# Patient Record
Sex: Female | Born: 1940 | Race: White | Hispanic: No | Smoking: Never smoker
Health system: Southern US, Community
[De-identification: ages and names within clinical notes are randomized; demographics above are authoritative.]

## PROBLEM LIST (undated history)

## (undated) DIAGNOSIS — S82892A Other fracture of left lower leg, initial encounter for closed fracture: Secondary | ICD-10-CM

## (undated) DIAGNOSIS — I82409 Acute embolism and thrombosis of unspecified deep veins of unspecified lower extremity: Secondary | ICD-10-CM

## (undated) DIAGNOSIS — E119 Type 2 diabetes mellitus without complications: Secondary | ICD-10-CM

## (undated) DIAGNOSIS — F32A Depression, unspecified: Secondary | ICD-10-CM

## (undated) DIAGNOSIS — M1990 Unspecified osteoarthritis, unspecified site: Secondary | ICD-10-CM

## (undated) DIAGNOSIS — Y92009 Unspecified place in unspecified non-institutional (private) residence as the place of occurrence of the external cause: Secondary | ICD-10-CM

## (undated) DIAGNOSIS — Z9289 Personal history of other medical treatment: Secondary | ICD-10-CM

## (undated) DIAGNOSIS — I10 Essential (primary) hypertension: Secondary | ICD-10-CM

## (undated) DIAGNOSIS — F329 Major depressive disorder, single episode, unspecified: Secondary | ICD-10-CM

## (undated) DIAGNOSIS — M549 Dorsalgia, unspecified: Secondary | ICD-10-CM

## (undated) DIAGNOSIS — K219 Gastro-esophageal reflux disease without esophagitis: Secondary | ICD-10-CM

## (undated) DIAGNOSIS — E271 Primary adrenocortical insufficiency: Secondary | ICD-10-CM

## (undated) DIAGNOSIS — G8929 Other chronic pain: Secondary | ICD-10-CM

## (undated) DIAGNOSIS — C801 Malignant (primary) neoplasm, unspecified: Secondary | ICD-10-CM

## (undated) DIAGNOSIS — F319 Bipolar disorder, unspecified: Secondary | ICD-10-CM

## (undated) DIAGNOSIS — E7800 Pure hypercholesterolemia, unspecified: Secondary | ICD-10-CM

## (undated) DIAGNOSIS — R55 Syncope and collapse: Secondary | ICD-10-CM

## (undated) DIAGNOSIS — W19XXXA Unspecified fall, initial encounter: Secondary | ICD-10-CM

## (undated) HISTORY — PX: COLOSTOMY: SHX63

## (undated) HISTORY — PX: CATARACT EXTRACTION W/ INTRAOCULAR LENS  IMPLANT, BILATERAL: SHX1307

## (undated) HISTORY — PX: VAGINAL HYSTERECTOMY: SUR661

## (undated) HISTORY — PX: NASAL HEMORRHAGE CONTROL: SHX287

## (undated) HISTORY — PX: DILATION AND CURETTAGE OF UTERUS: SHX78

## (undated) HISTORY — PX: JOINT REPLACEMENT: SHX530

## (undated) HISTORY — PX: COLOSTOMY REVERSAL: SHX5782

## (undated) HISTORY — PX: APPENDECTOMY: SHX54

## (undated) HISTORY — PX: TONSILLECTOMY: SUR1361

---

## 1968-01-09 HISTORY — PX: AUGMENTATION MAMMAPLASTY: SUR837

## 2003-12-06 ENCOUNTER — Ambulatory Visit: Payer: Self-pay

## 2003-12-22 ENCOUNTER — Ambulatory Visit: Payer: Self-pay

## 2004-05-08 ENCOUNTER — Inpatient Hospital Stay: Payer: Self-pay

## 2004-09-29 ENCOUNTER — Inpatient Hospital Stay: Payer: Self-pay

## 2004-09-29 ENCOUNTER — Other Ambulatory Visit: Payer: Self-pay

## 2004-10-19 ENCOUNTER — Ambulatory Visit: Payer: Self-pay | Admitting: Pain Medicine

## 2004-10-23 ENCOUNTER — Ambulatory Visit: Payer: Self-pay | Admitting: Pain Medicine

## 2004-11-22 ENCOUNTER — Ambulatory Visit: Payer: Self-pay | Admitting: Ophthalmology

## 2004-11-28 ENCOUNTER — Ambulatory Visit: Payer: Self-pay | Admitting: Ophthalmology

## 2004-12-05 ENCOUNTER — Ambulatory Visit: Payer: Self-pay | Admitting: Pain Medicine

## 2004-12-11 ENCOUNTER — Ambulatory Visit: Payer: Self-pay | Admitting: Pain Medicine

## 2004-12-12 ENCOUNTER — Ambulatory Visit: Payer: Self-pay | Admitting: Ophthalmology

## 2005-01-11 ENCOUNTER — Ambulatory Visit: Payer: Self-pay | Admitting: Pain Medicine

## 2005-05-01 ENCOUNTER — Ambulatory Visit: Payer: Self-pay | Admitting: Family Medicine

## 2005-05-08 ENCOUNTER — Ambulatory Visit: Payer: Self-pay | Admitting: Pain Medicine

## 2005-05-14 ENCOUNTER — Ambulatory Visit: Payer: Self-pay | Admitting: Pain Medicine

## 2005-06-05 ENCOUNTER — Emergency Department: Payer: Self-pay | Admitting: Emergency Medicine

## 2005-06-12 ENCOUNTER — Ambulatory Visit: Payer: Self-pay | Admitting: Pain Medicine

## 2005-06-20 ENCOUNTER — Ambulatory Visit: Payer: Self-pay | Admitting: Pain Medicine

## 2005-11-12 ENCOUNTER — Ambulatory Visit: Payer: Self-pay

## 2005-12-12 ENCOUNTER — Inpatient Hospital Stay: Payer: Self-pay | Admitting: Internal Medicine

## 2005-12-12 ENCOUNTER — Other Ambulatory Visit: Payer: Self-pay

## 2006-02-13 ENCOUNTER — Other Ambulatory Visit: Payer: Self-pay

## 2006-02-14 ENCOUNTER — Inpatient Hospital Stay: Payer: Self-pay | Admitting: Internal Medicine

## 2006-02-20 ENCOUNTER — Other Ambulatory Visit: Payer: Self-pay

## 2006-08-02 ENCOUNTER — Ambulatory Visit: Payer: Self-pay | Admitting: General Surgery

## 2006-08-07 ENCOUNTER — Ambulatory Visit: Payer: Self-pay | Admitting: General Surgery

## 2006-08-14 ENCOUNTER — Inpatient Hospital Stay: Payer: Self-pay | Admitting: General Surgery

## 2006-09-02 ENCOUNTER — Ambulatory Visit: Payer: Self-pay | Admitting: Family Medicine

## 2006-09-25 ENCOUNTER — Other Ambulatory Visit: Payer: Self-pay

## 2006-09-25 ENCOUNTER — Inpatient Hospital Stay: Payer: Self-pay | Admitting: *Deleted

## 2007-10-08 ENCOUNTER — Ambulatory Visit: Payer: Self-pay | Admitting: Specialist

## 2007-10-13 ENCOUNTER — Ambulatory Visit: Payer: Self-pay | Admitting: Specialist

## 2008-04-04 ENCOUNTER — Emergency Department: Payer: Self-pay | Admitting: Unknown Physician Specialty

## 2008-04-06 ENCOUNTER — Emergency Department: Payer: Self-pay | Admitting: Emergency Medicine

## 2008-05-14 ENCOUNTER — Inpatient Hospital Stay: Payer: Self-pay | Admitting: Internal Medicine

## 2009-01-11 ENCOUNTER — Ambulatory Visit: Payer: Self-pay | Admitting: Family Medicine

## 2009-07-01 ENCOUNTER — Emergency Department: Payer: Self-pay | Admitting: Emergency Medicine

## 2009-08-08 ENCOUNTER — Ambulatory Visit: Payer: Self-pay | Admitting: Family Medicine

## 2009-10-24 ENCOUNTER — Ambulatory Visit: Payer: Self-pay | Admitting: Family Medicine

## 2009-10-31 ENCOUNTER — Ambulatory Visit: Payer: Self-pay | Admitting: Orthopedic Surgery

## 2009-11-09 ENCOUNTER — Ambulatory Visit: Payer: Self-pay | Admitting: Pain Medicine

## 2009-11-14 ENCOUNTER — Other Ambulatory Visit: Payer: Self-pay | Admitting: Pain Medicine

## 2009-11-14 ENCOUNTER — Ambulatory Visit: Payer: Self-pay | Admitting: Pain Medicine

## 2009-12-07 ENCOUNTER — Ambulatory Visit: Payer: Self-pay | Admitting: Pain Medicine

## 2010-01-10 ENCOUNTER — Ambulatory Visit: Payer: Self-pay | Admitting: Surgery

## 2010-01-26 ENCOUNTER — Ambulatory Visit: Payer: Self-pay | Admitting: Internal Medicine

## 2010-02-20 ENCOUNTER — Ambulatory Visit: Payer: Self-pay | Admitting: Surgery

## 2010-03-16 ENCOUNTER — Ambulatory Visit: Payer: Self-pay | Admitting: Pain Medicine

## 2010-03-20 ENCOUNTER — Ambulatory Visit: Payer: Self-pay | Admitting: Pain Medicine

## 2010-04-18 ENCOUNTER — Ambulatory Visit: Payer: Self-pay | Admitting: Pain Medicine

## 2010-04-26 ENCOUNTER — Ambulatory Visit: Payer: Self-pay | Admitting: Pain Medicine

## 2010-05-10 ENCOUNTER — Ambulatory Visit: Payer: Self-pay | Admitting: Pain Medicine

## 2010-05-31 ENCOUNTER — Ambulatory Visit: Payer: Self-pay | Admitting: Pain Medicine

## 2010-07-14 ENCOUNTER — Ambulatory Visit: Payer: Self-pay | Admitting: Unknown Physician Specialty

## 2010-07-20 ENCOUNTER — Inpatient Hospital Stay: Payer: Self-pay | Admitting: Unknown Physician Specialty

## 2010-07-25 ENCOUNTER — Encounter: Payer: Self-pay | Admitting: Internal Medicine

## 2010-07-27 ENCOUNTER — Inpatient Hospital Stay: Payer: Self-pay | Admitting: Internal Medicine

## 2010-08-09 ENCOUNTER — Encounter: Payer: Self-pay | Admitting: Internal Medicine

## 2010-09-09 DIAGNOSIS — I82409 Acute embolism and thrombosis of unspecified deep veins of unspecified lower extremity: Secondary | ICD-10-CM

## 2010-09-09 HISTORY — PX: TOTAL HIP ARTHROPLASTY: SHX124

## 2010-09-09 HISTORY — DX: Acute embolism and thrombosis of unspecified deep veins of unspecified lower extremity: I82.409

## 2010-09-11 ENCOUNTER — Observation Stay: Payer: Self-pay | Admitting: Internal Medicine

## 2011-02-17 ENCOUNTER — Inpatient Hospital Stay: Payer: Self-pay | Admitting: Internal Medicine

## 2011-02-17 LAB — URINALYSIS, COMPLETE
Bilirubin,UR: NEGATIVE
Glucose,UR: >=500 mg/dL (ref 0–75)
Ph: 5 (ref 4.5–8.0)
RBC,UR: 8 /HPF (ref 0–5)
Squamous Epithelial: 2

## 2011-02-17 LAB — COMPREHENSIVE METABOLIC PANEL
Albumin: 3.1 g/dL — ABNORMAL LOW (ref 3.4–5.0)
Alkaline Phosphatase: 58 U/L (ref 50–136)
Bilirubin,Total: 0.5 mg/dL (ref 0.2–1.0)
Chloride: 97 mmol/L — ABNORMAL LOW (ref 98–107)
Creatinine: 1.88 mg/dL — ABNORMAL HIGH (ref 0.60–1.30)
Osmolality: 284 (ref 275–301)
Potassium: 3.9 mmol/L (ref 3.5–5.1)
SGPT (ALT): 14 U/L
Sodium: 136 mmol/L (ref 136–145)
Total Protein: 8.8 g/dL — ABNORMAL HIGH (ref 6.4–8.2)

## 2011-02-17 LAB — BASIC METABOLIC PANEL
Calcium, Total: 8.2 mg/dL — ABNORMAL LOW (ref 8.5–10.1)
Chloride: 107 mmol/L (ref 98–107)
Co2: 21 mmol/L (ref 21–32)
EGFR (Non-African Amer.): 25 — ABNORMAL LOW
Glucose: 108 mg/dL — ABNORMAL HIGH (ref 65–99)
Potassium: 3.6 mmol/L (ref 3.5–5.1)
Sodium: 143 mmol/L (ref 136–145)

## 2011-02-17 LAB — CBC
HCT: 47.3 % — ABNORMAL HIGH (ref 35.0–47.0)
HGB: 15.4 g/dL (ref 12.0–16.0)
MCH: 29.5 pg (ref 26.0–34.0)
MCHC: 32.7 g/dL (ref 32.0–36.0)
RBC: 5.24 10*6/uL — ABNORMAL HIGH (ref 3.80–5.20)

## 2011-02-17 LAB — PROTIME-INR: INR: 1.1

## 2011-02-17 LAB — APTT: Activated PTT: 42.8 secs — ABNORMAL HIGH (ref 23.6–35.9)

## 2011-02-17 LAB — LIPASE, BLOOD: Lipase: 101 U/L (ref 73–393)

## 2011-02-18 LAB — CBC WITH DIFFERENTIAL/PLATELET
Basophil #: 0 10*3/uL (ref 0.0–0.1)
Eosinophil #: 0 10*3/uL (ref 0.0–0.7)
Lymphocyte #: 0.4 10*3/uL — ABNORMAL LOW (ref 1.0–3.6)
Lymphocyte %: 5.6 %
MCHC: 32.6 g/dL (ref 32.0–36.0)
MCV: 91 fL (ref 80–100)
Neutrophil #: 5.7 10*3/uL (ref 1.4–6.5)
Platelet: 245 10*3/uL (ref 150–440)
RBC: 3.82 10*6/uL (ref 3.80–5.20)
RDW: 15.8 % — ABNORMAL HIGH (ref 11.5–14.5)

## 2011-02-18 LAB — BASIC METABOLIC PANEL
Anion Gap: 13 (ref 7–16)
BUN: 44 mg/dL — ABNORMAL HIGH (ref 7–18)
Creatinine: 2.12 mg/dL — ABNORMAL HIGH (ref 0.60–1.30)
EGFR (African American): 30 — ABNORMAL LOW
EGFR (Non-African Amer.): 25 — ABNORMAL LOW

## 2011-02-19 LAB — BASIC METABOLIC PANEL
BUN: 30 mg/dL — ABNORMAL HIGH (ref 7–18)
Calcium, Total: 7.8 mg/dL — ABNORMAL LOW (ref 8.5–10.1)
EGFR (African American): 47 — ABNORMAL LOW
EGFR (Non-African Amer.): 39 — ABNORMAL LOW
Glucose: 163 mg/dL — ABNORMAL HIGH (ref 65–99)
Potassium: 3.8 mmol/L (ref 3.5–5.1)
Sodium: 148 mmol/L — ABNORMAL HIGH (ref 136–145)

## 2011-02-19 LAB — PROTEIN / CREATININE RATIO, URINE: Protein, Random Urine: 12 mg/dL (ref 0–12)

## 2011-02-19 LAB — CBC WITH DIFFERENTIAL/PLATELET
Basophil %: 0 %
Eosinophil #: 0 10*3/uL (ref 0.0–0.7)
Lymphocyte %: 9.5 %
MCHC: 33 g/dL (ref 32.0–36.0)
Monocyte #: 0.2 10*3/uL (ref 0.0–0.7)
Neutrophil %: 86.6 %
RDW: 15.5 % — ABNORMAL HIGH (ref 11.5–14.5)

## 2011-02-20 LAB — HEMOGLOBIN: HGB: 9.3 g/dL — ABNORMAL LOW (ref 12.0–16.0)

## 2011-02-20 LAB — BASIC METABOLIC PANEL
Anion Gap: 10 (ref 7–16)
BUN: 19 mg/dL — ABNORMAL HIGH (ref 7–18)
Chloride: 114 mmol/L — ABNORMAL HIGH (ref 98–107)
Co2: 24 mmol/L (ref 21–32)
Co2: 21 mmol/L (ref 21–32)
Creatinine: 0.88 mg/dL (ref 0.60–1.30)
EGFR (African American): >60
EGFR (Non-African Amer.): >60
Osmolality: 297 (ref 275–301)
Sodium: 142 mmol/L (ref 136–145)

## 2011-02-20 LAB — IRON AND TIBC
Iron Saturation: 24 %
Iron: 73 ug/dL (ref 50–170)
Unbound Iron-Bind.Cap.: 233 ug/dL

## 2011-02-20 LAB — KAPPA/LAMBDA FREE LIGHT CHAINS (ARMC)

## 2011-02-20 LAB — UR PROT ELECTROPHORESIS, URINE RANDOM

## 2011-02-22 LAB — CULTURE, BLOOD (SINGLE)

## 2011-03-26 ENCOUNTER — Ambulatory Visit: Payer: Self-pay | Admitting: Gastroenterology

## 2011-03-27 ENCOUNTER — Ambulatory Visit: Payer: Self-pay | Admitting: Gastroenterology

## 2011-10-30 ENCOUNTER — Ambulatory Visit: Payer: Self-pay | Admitting: Internal Medicine

## 2011-11-09 ENCOUNTER — Ambulatory Visit: Payer: Self-pay | Admitting: Internal Medicine

## 2011-12-31 ENCOUNTER — Ambulatory Visit: Payer: Self-pay | Admitting: Internal Medicine

## 2012-02-09 DIAGNOSIS — S82892A Other fracture of left lower leg, initial encounter for closed fracture: Secondary | ICD-10-CM

## 2012-02-09 DIAGNOSIS — W19XXXA Unspecified fall, initial encounter: Secondary | ICD-10-CM

## 2012-02-09 HISTORY — DX: Other fracture of left lower leg, initial encounter for closed fracture: S82.892A

## 2012-02-09 HISTORY — DX: Unspecified place in unspecified non-institutional (private) residence as the place of occurrence of the external cause: W19.XXXA

## 2012-02-27 ENCOUNTER — Emergency Department: Payer: Self-pay | Admitting: Emergency Medicine

## 2012-04-10 ENCOUNTER — Inpatient Hospital Stay (HOSPITAL_COMMUNITY)
Admission: EM | Admit: 2012-04-10 | Discharge: 2012-04-11 | DRG: 637 | Disposition: A | Discharge status: Home / Self Care | Payer: Medicare Other | Source: Other Acute Inpatient Hospital | Attending: Internal Medicine | Admitting: Internal Medicine

## 2012-04-10 ENCOUNTER — Other Ambulatory Visit: Payer: Self-pay

## 2012-04-10 ENCOUNTER — Emergency Department: Payer: Self-pay | Admitting: Internal Medicine

## 2012-04-10 ENCOUNTER — Encounter (HOSPITAL_COMMUNITY): Payer: Self-pay | Admitting: Internal Medicine

## 2012-04-10 DIAGNOSIS — F411 Generalized anxiety disorder: Secondary | ICD-10-CM | POA: Diagnosis present

## 2012-04-10 DIAGNOSIS — E2749 Other adrenocortical insufficiency: Secondary | ICD-10-CM | POA: Diagnosis present

## 2012-04-10 DIAGNOSIS — Z9660 Presence of unspecified orthopedic joint implant: Secondary | ICD-10-CM

## 2012-04-10 DIAGNOSIS — E1169 Type 2 diabetes mellitus with other specified complication: Principal | ICD-10-CM | POA: Diagnosis present

## 2012-04-10 DIAGNOSIS — I609 Nontraumatic subarachnoid hemorrhage, unspecified: Secondary | ICD-10-CM | POA: Diagnosis present

## 2012-04-10 DIAGNOSIS — E119 Type 2 diabetes mellitus without complications: Secondary | ICD-10-CM

## 2012-04-10 DIAGNOSIS — Z79899 Other long term (current) drug therapy: Secondary | ICD-10-CM

## 2012-04-10 DIAGNOSIS — W1809XA Striking against other object with subsequent fall, initial encounter: Secondary | ICD-10-CM | POA: Diagnosis present

## 2012-04-10 DIAGNOSIS — N189 Chronic kidney disease, unspecified: Secondary | ICD-10-CM

## 2012-04-10 DIAGNOSIS — E162 Hypoglycemia, unspecified: Secondary | ICD-10-CM

## 2012-04-10 DIAGNOSIS — F319 Bipolar disorder, unspecified: Secondary | ICD-10-CM

## 2012-04-10 DIAGNOSIS — N179 Acute kidney failure, unspecified: Secondary | ICD-10-CM

## 2012-04-10 DIAGNOSIS — I10 Essential (primary) hypertension: Secondary | ICD-10-CM

## 2012-04-10 DIAGNOSIS — M25559 Pain in unspecified hip: Secondary | ICD-10-CM | POA: Diagnosis present

## 2012-04-10 DIAGNOSIS — E271 Primary adrenocortical insufficiency: Secondary | ICD-10-CM

## 2012-04-10 DIAGNOSIS — S066X9A Traumatic subarachnoid hemorrhage with loss of consciousness of unspecified duration, initial encounter: Secondary | ICD-10-CM | POA: Diagnosis present

## 2012-04-10 DIAGNOSIS — R55 Syncope and collapse: Secondary | ICD-10-CM

## 2012-04-10 DIAGNOSIS — Z794 Long term (current) use of insulin: Secondary | ICD-10-CM

## 2012-04-10 DIAGNOSIS — I129 Hypertensive chronic kidney disease with stage 1 through stage 4 chronic kidney disease, or unspecified chronic kidney disease: Secondary | ICD-10-CM | POA: Diagnosis present

## 2012-04-10 HISTORY — DX: Unspecified place in unspecified non-institutional (private) residence as the place of occurrence of the external cause: Y92.009

## 2012-04-10 HISTORY — DX: Gastro-esophageal reflux disease without esophagitis: K21.9

## 2012-04-10 HISTORY — DX: Type 2 diabetes mellitus without complications: E11.9

## 2012-04-10 HISTORY — DX: Personal history of other medical treatment: Z92.89

## 2012-04-10 HISTORY — DX: Depression, unspecified: F32.A

## 2012-04-10 HISTORY — DX: Major depressive disorder, single episode, unspecified: F32.9

## 2012-04-10 HISTORY — DX: Syncope and collapse: R55

## 2012-04-10 HISTORY — DX: Other fracture of left lower leg, initial encounter for closed fracture: S82.892A

## 2012-04-10 HISTORY — DX: Acute embolism and thrombosis of unspecified deep veins of unspecified lower extremity: I82.409

## 2012-04-10 HISTORY — DX: Pure hypercholesterolemia, unspecified: E78.00

## 2012-04-10 HISTORY — DX: Primary adrenocortical insufficiency: E27.1

## 2012-04-10 HISTORY — DX: Bipolar disorder, unspecified: F31.9

## 2012-04-10 HISTORY — DX: Dorsalgia, unspecified: M54.9

## 2012-04-10 HISTORY — DX: Unspecified osteoarthritis, unspecified site: M19.90

## 2012-04-10 HISTORY — DX: Essential (primary) hypertension: I10

## 2012-04-10 HISTORY — DX: Other chronic pain: G89.29

## 2012-04-10 HISTORY — DX: Unspecified fall, initial encounter: W19.XXXA

## 2012-04-10 LAB — CBC WITH DIFFERENTIAL/PLATELET
Eosinophils Relative: 1 % (ref 0–5)
HCT: 36.7 % (ref 36.0–46.0)
Hemoglobin: 11.7 g/dL — ABNORMAL LOW (ref 12.0–15.0)
Lymphocytes Relative: 25 % (ref 12–46)
Lymphs Abs: 3 10*3/uL (ref 0.7–4.0)
MCH: 27.5 pg (ref 26.0–34.0)
MCV: 86.2 fL (ref 78.0–100.0)
Monocytes Absolute: 0.7 10*3/uL (ref 0.1–1.0)
Monocytes Relative: 6 % (ref 3–12)
RBC: 4.26 MIL/uL (ref 3.87–5.11)
WBC: 11.9 10*3/uL — ABNORMAL HIGH (ref 4.0–10.5)

## 2012-04-10 LAB — COMPREHENSIVE METABOLIC PANEL
AST: 15 U/L (ref 0–37)
Albumin: 3.3 g/dL — ABNORMAL LOW (ref 3.5–5.2)
Alkaline Phosphatase: 54 U/L (ref 50–136)
BUN: 15 mg/dL (ref 6–23)
Chloride: 112 mmol/L — ABNORMAL HIGH (ref 98–107)
Co2: 24 mmol/L (ref 21–32)
Creatinine, Ser: 1.35 mg/dL — ABNORMAL HIGH (ref 0.50–1.10)
SGOT(AST): 26 U/L (ref 15–37)
SGPT (ALT): 19 U/L (ref 12–78)
Total Protein: 7.4 g/dL (ref 6.4–8.2)
Total Protein: 5.9 g/dL — ABNORMAL LOW (ref 6.0–8.3)

## 2012-04-10 LAB — CBC
MCH: 28.4 pg (ref 26.0–34.0)
MCHC: 32.3 g/dL (ref 32.0–36.0)
Platelet: 363 10*3/uL (ref 150–440)
RDW: 17.6 % — ABNORMAL HIGH (ref 11.5–14.5)
WBC: 10.2 10*3/uL (ref 3.6–11.0)

## 2012-04-10 LAB — CK TOTAL AND CKMB (NOT AT ARMC): CK-MB: 0.8 ng/mL (ref 0.5–3.6)

## 2012-04-10 LAB — GLUCOSE, CAPILLARY

## 2012-04-10 MED ORDER — FLUDROCORTISONE ACETATE 0.1 MG PO TABS
0.1000 mg | ORAL_TABLET | Freq: Every day | ORAL | Status: DC
  Administered 2012-04-10 – 2012-04-11 (×2): 0.1 mg via ORAL
  Filled 2012-04-10 (×2): qty 1

## 2012-04-10 MED ORDER — DEXTROSE 5 % IV SOLN
INTRAVENOUS | Status: AC
  Administered 2012-04-10: 50 mL via INTRAVENOUS

## 2012-04-10 MED ORDER — ONDANSETRON HCL 4 MG/2ML IJ SOLN: 4.0000 mg | Freq: Four times a day (QID) | INTRAMUSCULAR | Status: DC | PRN

## 2012-04-10 MED ORDER — HYDROCORTISONE 10 MG PO TABS
10.0000 mg | ORAL_TABLET | Freq: Every day | ORAL | Status: DC
  Administered 2012-04-10 – 2012-04-11 (×2): 10 mg via ORAL
  Filled 2012-04-10 (×2): qty 1

## 2012-04-10 MED ORDER — SODIUM CHLORIDE 0.9 % IV SOLN: 250.0000 mL | INTRAVENOUS | Status: DC | PRN

## 2012-04-10 MED ORDER — HYDROCODONE-ACETAMINOPHEN 5-325 MG PO TABS
1.0000 | ORAL_TABLET | ORAL | Status: DC | PRN
  Administered 2012-04-10: 2 via ORAL
  Administered 2012-04-10: 1 via ORAL
  Administered 2012-04-11 (×3): 2 via ORAL
  Administered 2012-04-11: 1 via ORAL
  Filled 2012-04-10: qty 2
  Filled 2012-04-10: qty 1
  Filled 2012-04-10 (×4): qty 2

## 2012-04-10 MED ORDER — ACETAMINOPHEN 650 MG RE SUPP: 650.0000 mg | Freq: Four times a day (QID) | RECTAL | Status: DC | PRN

## 2012-04-10 MED ORDER — POLYETHYLENE GLYCOL 3350 17 G PO PACK
17.0000 g | PACK | Freq: Every day | ORAL | Status: DC | PRN
  Filled 2012-04-10: qty 1

## 2012-04-10 MED ORDER — MORPHINE SULFATE 2 MG/ML IJ SOLN
2.0000 mg | INTRAMUSCULAR | Status: DC | PRN
  Administered 2012-04-11: 2 mg via INTRAVENOUS
  Filled 2012-04-10: qty 1

## 2012-04-10 MED ORDER — SODIUM CHLORIDE 0.9 % IJ SOLN
3.0000 mL | Freq: Two times a day (BID) | INTRAMUSCULAR | Status: DC
  Administered 2012-04-11: 3 mL via INTRAVENOUS

## 2012-04-10 MED ORDER — ACETAMINOPHEN 325 MG PO TABS
650.0000 mg | ORAL_TABLET | Freq: Four times a day (QID) | ORAL | Status: DC | PRN
  Filled 2012-04-10: qty 2

## 2012-04-10 MED ORDER — ONDANSETRON HCL 4 MG PO TABS: 4.0000 mg | ORAL_TABLET | Freq: Four times a day (QID) | ORAL | Status: DC | PRN

## 2012-04-10 MED ORDER — SODIUM CHLORIDE 0.9 % IJ SOLN: 3.0000 mL | INTRAMUSCULAR | Status: DC | PRN

## 2012-04-10 MED ORDER — HYDROCORTISONE 5 MG PO TABS
15.0000 mg | ORAL_TABLET | Freq: Every day | ORAL | Status: DC
  Administered 2012-04-11: 15 mg via ORAL
  Filled 2012-04-10 (×2): qty 1

## 2012-04-10 MED ORDER — SODIUM CHLORIDE 0.9 % IJ SOLN
3.0000 mL | Freq: Two times a day (BID) | INTRAMUSCULAR | Status: DC
  Administered 2012-04-10 – 2012-04-11 (×2): 3 mL via INTRAVENOUS

## 2012-04-10 NOTE — Progress Notes (Signed)
Patient received from [facility] via care link into room 3303. Transferred w/ some assistance into our bed. Patient oriented and settled into the room. Instructed not to get OOB and to call for help.
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
## 2012-04-11 ENCOUNTER — Inpatient Hospital Stay (HOSPITAL_COMMUNITY): Payer: Medicare Other

## 2012-04-11 DIAGNOSIS — N189 Chronic kidney disease, unspecified: Secondary | ICD-10-CM

## 2012-04-11 DIAGNOSIS — F319 Bipolar disorder, unspecified: Secondary | ICD-10-CM

## 2012-04-11 LAB — GLUCOSE, CAPILLARY
Glucose-Capillary: 111 mg/dL — ABNORMAL HIGH (ref 70–99)
Glucose-Capillary: 133 mg/dL — ABNORMAL HIGH (ref 70–99)

## 2012-04-11 LAB — CBC
HCT: 38.3 % (ref 36.0–46.0)
MCH: 27.5 pg (ref 26.0–34.0)
MCV: 87 fL (ref 78.0–100.0)
RDW: 17.7 % — ABNORMAL HIGH (ref 11.5–15.5)
WBC: 8.2 10*3/uL (ref 4.0–10.5)

## 2012-04-11 LAB — TSH: TSH: 0.961 u[IU]/mL (ref 0.350–4.500)

## 2012-04-11 LAB — HEMOGLOBIN A1C
Hgb A1c MFr Bld: 7.7 % — ABNORMAL HIGH (ref <5.7)
Mean Plasma Glucose: 174 mg/dL — ABNORMAL HIGH (ref <117)

## 2012-04-11 LAB — BASIC METABOLIC PANEL
BUN: 15 mg/dL (ref 6–23)
Calcium: 8.7 mg/dL (ref 8.4–10.5)
Chloride: 103 mEq/L (ref 96–112)
Creatinine, Ser: 1.36 mg/dL — ABNORMAL HIGH (ref 0.50–1.10)
GFR calc Af Amer: 45 mL/min — ABNORMAL LOW (ref >90)

## 2012-04-11 MED ORDER — MORPHINE SULFATE 4 MG/ML IJ SOLN: 4.0000 mg | INTRAMUSCULAR | Status: DC | PRN

## 2012-04-11 NOTE — Evaluation (Signed)
Physical Therapy Evaluation Patient Details Name: Jordan Brewer MRN: [id number] DOB: 08-01-1940 Today's Date: 04/11/2012 Time: 7425-9563 PT Time Calculation (min): 10 min  PT Assessment / Plan / Recommendation Clinical Impression  Pt adm after syncope/fall at home.  Pt with bifrontal SAH.  Pt also with fall in February that resulted in lt foot/ankle fx.  Pt to return home with son.  Recommend HHPT to look at home safety since pt has had multiple falls at home.  Reiterated to pt to wear lt CAM boot when OOB (pt wanted to walk to BR without boot).    PT Assessment  Patient needs continued PT services    Follow Up Recommendations  Home health PT    Does the patient have the potential to tolerate intense rehabilitation      Barriers to Discharge        Equipment Recommendations  None recommended by PT    Recommendations for Other Services     Frequency Min 3X/week    Precautions / Restrictions Precautions Precautions: Fall Precaution Comments: Pt fell in early Feb and broke her ankle.  In walking boot at this time.  Larey Seat on this admission due to syncopal episode. Required Braces or Orthoses: Other Brace/Splint Other Brace/Splint: Walking boot for L foot due to ankle Fx in early Feb. Restrictions Weight Bearing Restrictions: No   Pertinent Vitals/Pain Headache      Mobility  Bed Mobility Bed Mobility: Sit to Supine Supine to Sit: 7: Independent Sitting - Scoot to Edge of Bed: 7: Independent Sit to Supine: 7: Independent Details for Bed Mobility Assistance: no assist needed. Transfers Transfers: Stand Pivot Transfers Sit to Stand: 5: Supervision Stand to Sit: 5: Supervision Stand Pivot Transfers: 5: Supervision Details for Transfer Assistance: S for safety. Ambulation/Gait Ambulation/Gait Assistance: 5: Supervision;4: Min assist Ambulation Distance (Feet): 150 Feet Assistive device: Small based quad cane Ambulation/Gait Assistance Details: In room pt needed only  supervision for mobility but in hallway needed hand-held assist as well as quad cane.  Has rolling walker and rollator at home. Gait Pattern: Step-to pattern Gait velocity: decr    Exercises     PT Diagnosis: Difficulty walking  PT Problem List: Decreased balance;Decreased mobility;Decreased knowledge of precautions PT Treatment Interventions: DME instruction;Gait training;Patient/family education;Functional mobility training;Therapeutic activities;Therapeutic exercise;Balance training   PT Goals Acute Rehab PT Goals PT Goal Formulation: With patient Time For Goal Achievement: 04/18/12 Potential to Achieve Goals: Good Pt will go Sit to Stand: with modified independence PT Goal: Sit to Stand - Progress: Goal set today Pt will go Stand to Sit: with modified independence PT Goal: Stand to Sit - Progress: Goal set today Pt will Ambulate: 51 - 150 feet;with modified independence;with least restrictive assistive device PT Goal: Ambulate - Progress: Goal set today  Visit Information  Last PT Received On: 04/11/12 Assistance Needed: +1    Subjective Data  Subjective: "I can do it," pt stating about putting on her CAM boot. Patient Stated Goal: Go home   Prior Functioning  Home Living Lives With: Son Available Help at Discharge: Family;Available 24 hours/day Type of Home: Mobile home Home Access: Stairs to enter Entrance Stairs-Number of Steps: 4 Entrance Stairs-Rails: Left;Right;Can reach both Home Layout: One level Bathroom Shower/Tub: Tub/shower unit;Walk-in shower Bathroom Toilet: Standard Home Adaptive Equipment: Bedside commode/3-in-1;Grab bars around toilet;Grab bars in shower;Hand-held shower hose;Shower chair with back;Quad cane Prior Function Level of Independence: Independent Able to Take Stairs?: Yes Driving: Yes Vocation: Retired Musician: No difficulties Dominant Hand: Right  Cognition  Cognition Overall Cognitive Status: Appears within  functional limits for tasks assessed/performed Arousal/Alertness: Awake/alert Orientation Level: Oriented X4 / Intact Behavior During Session: [facility] for tasks performed Cognition - Other Comments: appears grossly intact.  Some mild concern for the fact she has had 2 falls in last two months.      Extremity/Trunk Assessment Right Upper Extremity Assessment RUE ROM/Strength/Tone: Within functional levels RUE Sensation: WFL - Light Touch RUE Coordination: WFL - gross/fine motor Left Upper Extremity Assessment LUE ROM/Strength/Tone: Within functional levels LUE Sensation: WFL - Light Touch LUE Coordination:  (great amount of swelling in back of hand.) Right Lower Extremity Assessment RLE ROM/Strength/Tone: WFL for tasks assessed Left Lower Extremity Assessment LLE ROM/Strength/Tone: Deficits LLE ROM/Strength/Tone Deficits: Limited due to fx Trunk Assessment Trunk Assessment: Normal   Balance Balance Balance Assessed: Yes Dynamic Standing Balance Dynamic Standing - Balance Support: Right upper extremity supported;During functional activity Dynamic Standing - Level of Assistance: 5: Stand by assistance Dynamic Standing - Balance Activities: Reaching for objects;Reaching across midline  End of Session PT - End of Session Equipment Utilized During Treatment: Gait belt Activity Tolerance: Patient tolerated treatment well Patient left: in bed;with call bell/phone within reach;with bed alarm set Nurse Communication: Mobility status  GP     Eagle Pitta 04/11/2012, 12:20 PM  [facility] PT [phone]

## 2012-04-11 NOTE — Progress Notes (Signed)
Utilization review completed. Orma Cheetham, RN, BSN. 

## 2012-04-11 NOTE — Evaluation (Signed)
Occupational Therapy Evaluation and Discharge Summary Patient Details Name: Jordan Brewer MRN: [id number] DOB: [birth year] Today's Date: 04/11/2012 Time: 7829-5621 OT Time Calculation (min): 28 min  OT Assessment / Plan / Recommendation Clinical Impression  Pt is a 72 yo female admitted for possible syncopal episode due to low blood sugar resulting in a fall and a B frontal SDH.  Pt overall is close to baseline with her walking boot from old fall.  Pt will have son at home with her and is S for ADLS.  No OT needs at this tie.    OT Assessment  Patient does not need any further OT services    Follow Up Recommendations  No OT follow up    Barriers to Discharge      Equipment Recommendations  None recommended by OT    Recommendations for Other Services    Frequency       Precautions / Restrictions Precautions Precautions: Fall Precaution Comments: Pt fell in early Feb and broke her ankle.  In walking boot at this time.  Larey Seat on this admission due to syncopal episode. Required Braces or Orthoses: Other Brace/Splint Other Brace/Splint: Walking boot for L foot due to ankle Fx in early Feb. Restrictions Weight Bearing Restrictions: No   Pertinent Vitals/Pain Pt with headache 5/10 pain.    ADL  Eating/Feeding: Performed;Independent Where Assessed - Eating/Feeding: Edge of bed Grooming: Performed;Wash/dry hands;Wash/dry face;Supervision/safety Where Assessed - Grooming: Supported standing Upper Body Bathing: Simulated;Set up Where Assessed - Upper Body Bathing: Unsupported sitting Lower Body Bathing: Simulated;Set up Where Assessed - Lower Body Bathing: Supported sit to stand Upper Body Dressing: Performed;Set up Where Assessed - Upper Body Dressing: Unsupported sitting Lower Body Dressing: Performed;Set up Where Assessed - Lower Body Dressing: Supported sit to stand Toilet Transfer: Research scientist (life sciences) Method: Sit to stand;Stand Financial trader: Comfort height toilet;Grab bars Toileting - Architect and Hygiene: Performed;Supervision/safety Where Assessed - Engineer, mining and Hygiene: Standing Equipment Used: Cane Transfers/Ambulation Related to ADLs: All mobilty done with S in room. ADL Comments: Pt overall S to mod I with use of cane to get around.  Cues to stand and get her bearings before walking.    OT Diagnosis:    OT Problem List:   OT Treatment Interventions:     OT Goals    Visit Information  Last OT Received On: 04/11/12 Assistance Needed: +1    Subjective Data  Subjective: "I will have my son with me when I go home." Patient Stated Goal: to go home   Prior Functioning     Home Living Lives With: Son Available Help at Discharge: Family;Available 24 hours/day Type of Home: Mobile home Home Access: Stairs to enter Entrance Stairs-Number of Steps: 4 Entrance Stairs-Rails: Left;Right;Can reach both Home Layout: One level Bathroom Shower/Tub: Tub/shower unit;Walk-in shower Bathroom Toilet: Standard Home Adaptive Equipment: Bedside commode/3-in-1;Grab bars around toilet;Grab bars in shower;Hand-held shower hose;Shower chair with back;Quad cane Prior Function Level of Independence: Independent Able to Take Stairs?: Yes Driving: Yes Vocation: Retired Musician: No difficulties Dominant Hand: Right         Vision/Perception Vision - History Baseline Vision: No visual deficits Patient Visual Report: No change from baseline Vision - Assessment Vision Assessment: Vision not tested   [employer] Overall Cognitive Status: Appears within functional limits for tasks assessed/performed Arousal/Alertness: Awake/alert Orientation Level: Oriented X4 / Intact Behavior During Session: [facility] for tasks performed Cognition - Other Comments: appears grossly intact.  Some mild  concern for the fact she has had 2 falls in last two months.       Extremity/Trunk Assessment Right Upper Extremity Assessment RUE ROM/Strength/Tone: Within functional levels RUE Sensation: WFL - Light Touch RUE Coordination: WFL - gross/fine motor Left Upper Extremity Assessment LUE ROM/Strength/Tone: Within functional levels LUE Sensation: WFL - Light Touch LUE Coordination:  (great amount of swelling in back of hand.) Trunk Assessment Trunk Assessment: Normal     Mobility Bed Mobility Bed Mobility: Supine to Sit;Sitting - Scoot to Edge of Bed Supine to Sit: 7: Independent Sitting - Scoot to Edge of Bed: 7: Independent Details for Bed Mobility Assistance: no assist needed. Transfers Transfers: Sit to Stand;Stand to Sit Sit to Stand: 5: Supervision Stand to Sit: 5: Supervision Details for Transfer Assistance: S for safety.     Exercise     Balance Balance Balance Assessed: Yes Dynamic Standing Balance Dynamic Standing - Balance Support: Right upper extremity supported;During functional activity Dynamic Standing - Level of Assistance: 5: Stand by assistance Dynamic Standing - Balance Activities: Reaching for objects;Reaching across midline   End of Session OT - End of Session Activity Tolerance: Patient tolerated treatment well Patient left: in bed;with call bell/phone within reach Nurse Communication: Mobility status  GO     Hope Budds 04/11/2012, 9:46 AM [phone]

## 2012-04-11 NOTE — Care Management Note (Unsigned)
    Page 1 of 1   04/11/2012     11:22:19 AM   CARE MANAGEMENT NOTE 04/11/2012  Patient:  DEONI, COSEY   Account Number:  [id number]  Date Initiated:  04/11/2012  Documentation initiated by:  Letha Cape  Subjective/Objective Assessment:   dx syncope , hypoglcemia,sah  admit- lives with son.     Action/Plan:   pt/ot eval- per ot pt has no ot needs, waiting for pt eval.   Anticipated DC Date:  04/11/2012   Anticipated DC Plan:  HOME/SELF CARE      DC Planning Services  CM consult      Choice offered to / List presented to:             Status of service:  Completed, signed off Medicare Important Message given?   (If response is "NO", the following Medicare IM given date fields will be blank) Date Medicare IM given:   Date Additional Medicare IM given:    Discharge Disposition:  HOME/SELF CARE  Per UR Regulation:  Reviewed for med. necessity/level of care/duration of stay  If discussed at Long Length of Stay Meetings, dates discussed:    Comments:  04/11/12 11:02 Letha Cape RN, BSN [phone] patient lives with son.  PTA indep.  Await pt /ot evals. Pt has not ot needs, await pt eval.

## 2012-04-11 NOTE — Progress Notes (Signed)
Subjective: Patient reports HA , but otherwise doing well - was up ambulating  Objective: Vital signs in last 24 hours: Temp:  [98.2 F (36.8 C)-99.3 F (37.4 C)] 98.3 F (36.8 C) (04/04 0531) Pulse Rate:  [90-116] 90 (04/04 0531) Resp:  [20-28] 20 (04/04 0531) BP: (108-163)/(70-83) 128/83 mmHg (04/04 0531) SpO2:  [91 %-99 %] 97 % (04/04 0531) Weight:  [72.122 kg (159 lb)-74.4 kg (164 lb 0.4 oz)] 74.4 kg (164 lb 0.4 oz) (04/04 0500)  Intake/Output from previous day: 04/03 0701 - 04/04 0700 In: 271.7 [P.O.:110; I.V.:161.7] Out: 800 [Urine:800] Intake/Output this shift: Total I/O In: 240 [P.O.:240] Out: -   AAOx3 - moves all 4 well  Lab Results:  Recent Labs  04/10/12 2018 04/11/12 0547  WBC 11.9* 8.2  HGB 11.7* 12.1  HCT 36.7 38.3  PLT 264 302   BMET  Recent Labs  04/10/12 2018 04/11/12 0547  NA 139 138  K 4.0 3.8  CL 105 103  CO2 23 27  GLUCOSE 185* 143*  BUN 15 15  CREATININE 1.35* 1.36*  CALCIUM 8.5 8.7    Studies/Results: Ct Head Wo Contrast  04/11/2012  *RADIOLOGY REPORT*  Clinical Data: Follow-up subarachnoid hemorrhage.  Swelling posterior head.  CT HEAD WITHOUT CONTRAST  Technique:  Contiguous axial images were obtained from the base of the skull through the vertex without contrast.  Comparison: None.  Findings: The brain shows a background pattern of atrophy with low density throughout the hemispheric white matter likely to represent chronic small vessel disease.  There is some subarachnoid blood within the sulci at the vertex on the left, with a very little bit on the right as well.  No subarachnoid blood seen around the base of the brain.  No intraventricular blood.  No intraparenchymal hemorrhage.  No skull fracture.  There is posterior scalp swelling at the vertex.  No fluid in the visualized sinuses, middle ears or mastoids.  IMPRESSION: Background pattern of chronic appearing small vessel disease throughout the cerebral hemispheres.  Small amount of  subarachnoid blood in the sulci at the vertex on the left and to a minimal extent on the right.  Posterior scalp swelling at the vertex.  This consolation of findings suggests post- traumatic subarachnoid hemorrhage rather than aneurysmal hemorrhage.   Original Report Authenticated By: Paulina Fusi, M.D.     Assessment/Plan: CT improved  - no further neurosurgical intervention needed -    LOS: 1 day     Malory Spurr R, MD 04/11/2012, 9:42 AM
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
## 2012-04-11 NOTE — Progress Notes (Signed)
Nsg Discharge Note  Admit Date:  04/10/2012 Discharge date: 04/11/2012   Jordan Brewer to be D/C'd Home per MD order.  AVS completed.  Copy for chart, and copy for patient signed, and dated. Patient/caregiver able to verbalize understanding.  Discharge Medication:   Medication List    TAKE these medications       ALPRAZolam 0.5 MG tablet  Commonly known as:  XANAX  Take 0.5 mg by mouth 2 (two) times daily as needed for sleep. For anxiety     DULoxetine 60 MG capsule  Commonly known as:  CYMBALTA  Take 60 mg by mouth daily.     fenofibrate 160 MG tablet  Take 160 mg by mouth daily.     fludrocortisone 0.1 MG tablet  Commonly known as:  FLORINEF  Take 0.1 mg by mouth daily.     HYDROcodone-acetaminophen 7.5-325 MG per tablet  Commonly known as:  NORCO  Take 1 tablet by mouth 3 (three) times daily as needed for pain. For hip pain     hydrocortisone 10 MG tablet  Commonly known as:  CORTEF  Take 10-20 mg by mouth daily.     lisinopril 20 MG tablet  Commonly known as:  PRINIVIL,ZESTRIL  Take 20 mg by mouth 2 (two) times daily.     omeprazole 40 MG capsule  Commonly known as:  PRILOSEC  Take 40 mg by mouth 2 (two) times daily.     SYSTANE OP  Apply 1 drop to eye daily as needed. For burning/irritated eyes        Discharge Assessment: Filed Vitals:   04/11/12 1336  BP: 124/71  Pulse: 95  Temp: 98 F (36.7 C)  Resp: 20   Skin clean, dry and intact without evidence of skin break down, no evidence of skin tears noted. IV catheter discontinued intact. Site without signs and symptoms of complications - no redness or edema noted at insertion site, patient denies c/o pain - only slight tenderness at site.  Dressing with slight pressure applied.  D/c Instructions-Education: Discharge instructions given to patient/family with verbalized understanding. D/c education completed with patient/family including follow up instructions, medication list, d/c activities limitations  if indicated, with other d/c instructions as indicated by MD - patient able to verbalize understanding, all questions fully answered. Patient instructed to return to ED, call 911, or call MD for any changes in condition.  Patient escorted via WC, and D/C home via private auto.  Jordan Brewer Consuella Lose, RN 04/11/2012 7:13 PM

## 2012-04-16 MED FILL — Ondansetron HCl Inj 4 MG/2ML (2 MG/ML): INTRAMUSCULAR | Qty: 2 | Status: AC

## 2012-05-19 ENCOUNTER — Ambulatory Visit: Payer: Self-pay | Admitting: Internal Medicine

## 2013-07-13 ENCOUNTER — Inpatient Hospital Stay: Payer: Self-pay | Admitting: Internal Medicine

## 2013-07-13 LAB — URINALYSIS, COMPLETE
Bilirubin,UR: NEGATIVE
Glucose,UR: >=500 mg/dL (ref 0–75)
Nitrite: NEGATIVE
Ph: 5 (ref 4.5–8.0)
SPECIFIC GRAVITY: 1.015 (ref 1.003–1.030)
WBC UR: 100 /HPF (ref 0–5)

## 2013-07-13 LAB — PHOSPHORUS: PHOSPHORUS: 2.5 mg/dL (ref 2.5–4.9)

## 2013-07-13 LAB — COMPREHENSIVE METABOLIC PANEL
ALBUMIN: 2.8 g/dL — AB (ref 3.4–5.0)
AST: 21 U/L (ref 15–37)
Alkaline Phosphatase: 90 U/L
Anion Gap: 12 (ref 7–16)
BUN: 26 mg/dL — AB (ref 7–18)
Bilirubin,Total: 0.6 mg/dL (ref 0.2–1.0)
CO2: 22 mmol/L (ref 21–32)
Calcium, Total: 9.7 mg/dL (ref 8.5–10.1)
Chloride: 97 mmol/L — ABNORMAL LOW (ref 98–107)
Creatinine: 1.62 mg/dL — ABNORMAL HIGH (ref 0.60–1.30)
EGFR (African American): 37 — ABNORMAL LOW
EGFR (Non-African Amer.): 32 — ABNORMAL LOW
GLUCOSE: 208 mg/dL — AB (ref 65–99)
OSMOLALITY: 274 (ref 275–301)
Potassium: 4.2 mmol/L (ref 3.5–5.1)
SGPT (ALT): 19 U/L (ref 12–78)
Sodium: 131 mmol/L — ABNORMAL LOW (ref 136–145)
TOTAL PROTEIN: 8.2 g/dL (ref 6.4–8.2)

## 2013-07-13 LAB — CBC WITH DIFFERENTIAL/PLATELET
BASOS ABS: 0.1 10*3/uL (ref 0.0–0.1)
Basophil %: 0.5 %
Eosinophil #: 0.1 10*3/uL (ref 0.0–0.7)
Eosinophil %: 0.5 %
HCT: 45.7 % (ref 35.0–47.0)
HGB: 14.7 g/dL (ref 12.0–16.0)
LYMPHS PCT: 8.4 %
Lymphocyte #: 0.9 10*3/uL — ABNORMAL LOW (ref 1.0–3.6)
MCH: 26.5 pg (ref 26.0–34.0)
MCHC: 32.1 g/dL (ref 32.0–36.0)
MCV: 83 fL (ref 80–100)
MONOS PCT: 7.4 %
Monocyte #: 0.8 x10 3/mm (ref 0.2–0.9)
NEUTROS PCT: 83.2 %
Neutrophil #: 9.1 10*3/uL — ABNORMAL HIGH (ref 1.4–6.5)
Platelet: 275 10*3/uL (ref 150–440)
RBC: 5.54 10*6/uL — ABNORMAL HIGH (ref 3.80–5.20)
RDW: 18.9 % — AB (ref 11.5–14.5)
WBC: 10.9 10*3/uL (ref 3.6–11.0)

## 2013-07-13 LAB — MAGNESIUM: MAGNESIUM: 1.5 mg/dL — AB

## 2013-07-13 LAB — PROTIME-INR
INR: 1.1
Prothrombin Time: 13.8 secs (ref 11.5–14.7)

## 2013-07-13 LAB — TROPONIN I: Troponin-I: <0.02 ng/mL

## 2013-07-14 LAB — CBC WITH DIFFERENTIAL/PLATELET
BASOS PCT: 0.5 %
Basophil #: 0.1 10*3/uL (ref 0.0–0.1)
EOS PCT: 1.2 %
Eosinophil #: 0.1 10*3/uL (ref 0.0–0.7)
HCT: 36 % (ref 35.0–47.0)
HGB: 11.4 g/dL — AB (ref 12.0–16.0)
Lymphocyte #: 0.7 10*3/uL — ABNORMAL LOW (ref 1.0–3.6)
Lymphocyte %: 7.4 %
MCH: 26.6 pg (ref 26.0–34.0)
MCHC: 31.8 g/dL — AB (ref 32.0–36.0)
MCV: 84 fL (ref 80–100)
MONOS PCT: 8.2 %
Monocyte #: 0.8 x10 3/mm (ref 0.2–0.9)
Neutrophil #: 8.3 10*3/uL — ABNORMAL HIGH (ref 1.4–6.5)
Neutrophil %: 82.7 %
PLATELETS: 219 10*3/uL (ref 150–440)
RBC: 4.3 10*6/uL (ref 3.80–5.20)
RDW: 18.7 % — AB (ref 11.5–14.5)
WBC: 10.1 10*3/uL (ref 3.6–11.0)

## 2013-07-14 LAB — BASIC METABOLIC PANEL
ANION GAP: 9 (ref 7–16)
BUN: 22 mg/dL — ABNORMAL HIGH (ref 7–18)
CALCIUM: 7.9 mg/dL — AB (ref 8.5–10.1)
CREATININE: 1.37 mg/dL — AB (ref 0.60–1.30)
Chloride: 107 mmol/L (ref 98–107)
Co2: 23 mmol/L (ref 21–32)
EGFR (African American): 45 — ABNORMAL LOW
EGFR (Non-African Amer.): 39 — ABNORMAL LOW
Glucose: 153 mg/dL — ABNORMAL HIGH (ref 65–99)
Osmolality: 284 (ref 275–301)
Potassium: 4 mmol/L (ref 3.5–5.1)
SODIUM: 139 mmol/L (ref 136–145)

## 2013-07-15 LAB — BASIC METABOLIC PANEL
ANION GAP: 13 (ref 7–16)
BUN: 20 mg/dL — AB (ref 7–18)
CALCIUM: 8.3 mg/dL — AB (ref 8.5–10.1)
CREATININE: 0.98 mg/dL (ref 0.60–1.30)
Chloride: 109 mmol/L — ABNORMAL HIGH (ref 98–107)
Co2: 19 mmol/L — ABNORMAL LOW (ref 21–32)
GFR CALC NON AF AMER: 58 — AB
Glucose: 195 mg/dL — ABNORMAL HIGH (ref 65–99)
Osmolality: 289 (ref 275–301)
Potassium: 4.8 mmol/L (ref 3.5–5.1)
Sodium: 141 mmol/L (ref 136–145)

## 2013-07-15 LAB — URINE CULTURE

## 2013-07-15 LAB — CBC WITH DIFFERENTIAL/PLATELET
BASOS PCT: 0.3 %
Basophil #: 0 10*3/uL (ref 0.0–0.1)
Eosinophil #: 0 10*3/uL (ref 0.0–0.7)
Eosinophil %: 0 %
HCT: 35.4 % (ref 35.0–47.0)
HGB: 11 g/dL — ABNORMAL LOW (ref 12.0–16.0)
LYMPHS ABS: 0.3 10*3/uL — AB (ref 1.0–3.6)
Lymphocyte %: 3 %
MCH: 26 pg (ref 26.0–34.0)
MCHC: 31 g/dL — AB (ref 32.0–36.0)
MCV: 84 fL (ref 80–100)
Monocyte #: 0.2 x10 3/mm (ref 0.2–0.9)
Monocyte %: 2.3 %
Neutrophil #: 9.7 10*3/uL — ABNORMAL HIGH (ref 1.4–6.5)
Neutrophil %: 94.4 %
PLATELETS: 242 10*3/uL (ref 150–440)
RBC: 4.23 10*6/uL (ref 3.80–5.20)
RDW: 19.3 % — ABNORMAL HIGH (ref 11.5–14.5)
WBC: 10.3 10*3/uL (ref 3.6–11.0)

## 2013-07-16 LAB — BASIC METABOLIC PANEL
Anion Gap: 9 (ref 7–16)
BUN: 22 mg/dL — ABNORMAL HIGH (ref 7–18)
CALCIUM: 8.7 mg/dL (ref 8.5–10.1)
CO2: 27 mmol/L (ref 21–32)
Chloride: 106 mmol/L (ref 98–107)
Creatinine: 1.05 mg/dL (ref 0.60–1.30)
EGFR (African American): >60
GFR CALC NON AF AMER: 54 — AB
GLUCOSE: 256 mg/dL — AB (ref 65–99)
OSMOLALITY: 295 (ref 275–301)
Potassium: 4.5 mmol/L (ref 3.5–5.1)
SODIUM: 142 mmol/L (ref 136–145)

## 2013-07-16 LAB — CBC WITH DIFFERENTIAL/PLATELET
BASOS PCT: 0.4 %
Basophil #: 0.1 10*3/uL (ref 0.0–0.1)
EOS ABS: 0 10*3/uL (ref 0.0–0.7)
Eosinophil %: 0 %
HCT: 34.4 % — ABNORMAL LOW (ref 35.0–47.0)
HGB: 10.8 g/dL — ABNORMAL LOW (ref 12.0–16.0)
LYMPHS ABS: 0.5 10*3/uL — AB (ref 1.0–3.6)
LYMPHS PCT: 4.1 %
MCH: 26.5 pg (ref 26.0–34.0)
MCHC: 31.3 g/dL — AB (ref 32.0–36.0)
MCV: 85 fL (ref 80–100)
MONO ABS: 0.4 x10 3/mm (ref 0.2–0.9)
Monocyte %: 3.3 %
NEUTROS PCT: 92.2 %
Neutrophil #: 10.9 10*3/uL — ABNORMAL HIGH (ref 1.4–6.5)
Platelet: 320 10*3/uL (ref 150–440)
RBC: 4.06 10*6/uL (ref 3.80–5.20)
RDW: 19.3 % — ABNORMAL HIGH (ref 11.5–14.5)
WBC: 11.9 10*3/uL — ABNORMAL HIGH (ref 3.6–11.0)

## 2013-07-18 LAB — CULTURE, BLOOD (SINGLE)

## 2014-01-14 DIAGNOSIS — H029 Unspecified disorder of eyelid: Secondary | ICD-10-CM | POA: Diagnosis not present

## 2014-01-14 DIAGNOSIS — H02824 Cysts of left upper eyelid: Secondary | ICD-10-CM | POA: Diagnosis not present

## 2014-01-25 DIAGNOSIS — M25861 Other specified joint disorders, right knee: Secondary | ICD-10-CM | POA: Diagnosis not present

## 2014-01-25 DIAGNOSIS — M25561 Pain in right knee: Secondary | ICD-10-CM | POA: Diagnosis not present

## 2014-01-25 DIAGNOSIS — M25571 Pain in right ankle and joints of right foot: Secondary | ICD-10-CM | POA: Diagnosis not present

## 2014-01-25 DIAGNOSIS — M1711 Unilateral primary osteoarthritis, right knee: Secondary | ICD-10-CM | POA: Diagnosis not present

## 2014-04-07 DIAGNOSIS — F419 Anxiety disorder, unspecified: Secondary | ICD-10-CM | POA: Diagnosis not present

## 2014-04-07 DIAGNOSIS — I10 Essential (primary) hypertension: Secondary | ICD-10-CM | POA: Diagnosis not present

## 2014-04-07 DIAGNOSIS — F5104 Psychophysiologic insomnia: Secondary | ICD-10-CM | POA: Diagnosis not present

## 2014-04-07 DIAGNOSIS — G894 Chronic pain syndrome: Secondary | ICD-10-CM | POA: Diagnosis not present

## 2014-05-01 NOTE — Discharge Summary (Signed)
PATIENT NAME:  Jordan Brewer, Jordan Brewer MR#:  [id number] DATE OF BIRTH:  [birth year]  DATE OF ADMISSION:  07/13/2013 DATE OF DISCHARGE:  07/16/2013  DIAGNOSES AT THE TIME OF DISCHARGE: 1. Escherichia coli urinary tract infection with sepsis.  2. Type 2 diabetes.  3. History of Addison's disease.  4. Anxiety and depression.   CHIEF COMPLAINT: Confusion and fever.   HISTORY OF PRESENT ILLNESS: Jordan Brewer is a 74 year old female who presented to the ED complaining of fever, dysuria associated with vomiting, and weakness. The patient was also complaining of abdominal pain and foul smelling urine associated with increased frequency and pain. The patient had been treated with outpatient antibiotics but continued to be symptomatic. She was noted to have an elevated white cell count also and she was noted to have an elevated lactic acid at 1.4 and was treated with IV fluids and IV cefepime.   PAST MEDICAL HISTORY: Significant for Addison's disease, bipolar disorder, hypertension, hyperlipidemia, anxiety, neuropathy, GERD, and depression. Please see H and P for other details.   LABORATORY DATA: Showed a cloudy-appearing urine with glucose greater than 500 ketones and trace 2+ blood, leukocyte esterase 2+, lactic acid elevated at 1.4, glucose is 208, BUN 26, creatinine 1.62, sodium 131, potassium 4.2. Urine cultures grew Escherichia coli greater than 100,000 CFU per milliliter that was sensitive to Levaquin, ceftriaxone, nitrofurantoin, and Bactrim. Blood cultures were negative.   HOSPITAL COURSE: During her stay in the hospital the patient gradually improved. She received intravenous fluids and her serum creatinine improved from 1.62 to 1.05.   She was also seen by physical therapy and discharged in stable condition on the following medications: Amlodipine 10 mg once a day,  fludrocortisone 0.1 mg once a day, duloxetine 60 mg once a day, omeprazole 40 mg b.i.d., fenofibrate 1 tab once a day, Humalog KwikPen 8  units subcutaneous in morning 10 units at lunch and 12 units in the evening, lovastatin 40 mg once a day, hydrocortisone 20 mg once a day and 10 mg at bedtime, lisinopril 20 mg once a day, Ceftin 250 mg p.o. b.i.d. for 7 days, and Percocet 1 tablet daily, p.r.n.   The patient has been advised to drink plenty of fluids and to follow up in the clinic with me, Dr. Ginette Pitman, in 1 to 2 weeks' time, advised to call back with any questions or concern.   TOTAL TIME SPENT ON DISCHARGING THIS PATIENT: 35 minutes.     ____________________________ Tracie Harrier, MD vh:lt D: 08/03/2013 18:09:02 ET T: 08/04/2013 04:31:05 ET JOB#: [id number]  cc: Tracie Harrier, MD, <Dictator> Tracie Harrier MD ELECTRONICALLY SIGNED 08/06/2013 17:57

## 2014-05-01 NOTE — H&P (Signed)
PATIENT NAME:  Jordan Brewer, Jordan Brewer MR#:  [id number] DATE OF BIRTH:  06-20-1940  DATE OF ADMISSION:  07/14/2013  PRIMARY CARE PHYSICIAN: Dr. Tracie Harrier  REFERRING PHYSICIAN: Dr. Joni Fears   CHIEF COMPLAINT: Intermittent episodes of confusion and fever.   HISTORY OF PRESENT ILLNESS: The patient is a 74 year old Caucasian female who is brought into the ED via EMS, as the patient was complaining of fever, dysuria, vomiting, and generalized weakness. According to the ER records, the patient was having a four-day history of epigastric abdominal pain associated with fever, foul-smelling urine, frequent urination and burning and painful micturition. This is associated with generalized weakness and vomiting. The patient tried outpatient antibiotics given by her primary care physician, with no significant improvement. The patient was treated with p.o. antibiotics multiple times for acute cystitis in the past four weeks, but still has a urinary tract infection that has not completely resolved. In the ED, the patient's laboratories revealed elevated lactic acid at 1.40. White count is not elevated, which could be from the outpatient treatment with antibiotics. Blood cultures and urine cultures were ordered, and the patient was given cefepime. She also has received IV fluids. As the patient's magnesium is low, she received 2 grams of magnesium sulfate in IV piggyback form. During my examination, the patient is very sleepy and tired and falling asleep, complaining that she is feeling weak and having pain in the stomach. No other complaints. The patient is tired, and could not give any history. No family was at bedside.   PAST MEDICAL HISTORY: Diabetes mellitus, Addison disease, bipolar disorder, hypertension, hyperlipidemia, anxiety, neuropathy, GERD and depression.   PAST SURGICAL HISTORY: Ostomy reversal, diverticulitis, Hartmann's procedure, recent history of left hip surgery in July 2012.   ALLERGIES: SHE IS  ALLERGIC TO CODEINE AND PHENERGAN.   PSYCHOSOCIAL HISTORY: Lives alone, lives at home. According to the old records, she does not smoke, drink or use any illicit drugs.   FAMILY HISTORY: Mother passed away at age 39 with CVA complications. Father deceased at age 15 with lung cancer.   REVIEW OF SYSTEMS: Unobtainable, as the patient is very lethargic and sleepy.   HOME MEDICATIONS: Omeprazole 40 mg p.o. b.i.d., lovastatin 40 mg p.o. once a day, lisinopril 20 mg p.o. 2 times a day, hydrocortisone 20 mg 1 tablet p.o. once daily, hydrocortisone 10 mg at bedtime, fenofibrate 160 mg once daily, duloxetine 60 mg capsules 1 capsule p.o. once daily, amlodipine 10 mg p.o. once daily, Tylenol 1 tablet p.o. 3 times a day.   PHYSICAL EXAMINATION: VITAL SIGNS: Temperature 98.8, pulse 106, respirations 24, blood pressure is 121/69, pulse oximetry is 96%. Initial temperature in the ED was at 101 degrees Fahrenheit, with pulse at 136. GENERAL APPEARANCE: Not in any acute distress. Moderately built and nourished.  HEENT: Normocephalic, atraumatic. Pupils are equally reacting to light and accommodation. No scleral icterus. No conjunctival injection. Dry mucous membranes.  NECK: Supple. No JVD. No thyromegaly. Spontaneously moving her neck.  LUNGS: Clear to auscultation bilaterally. No accessory muscle. There is no anterior chest wall tenderness on palpation.  CARDIAC: S1 and S2 normal. Regular rate and rhythm. No murmurs. Tachycardic. No peripheral edema.  GASTROINTESTINAL: Soft. Bowel sounds are positive in all four quadrants. Positive epigastric tenderness. No rebound tenderness. No masses.  NEUROLOGIC: The patient is very sleepy and lethargic, arousable but not answering any questions, falling asleep. Could not elicit motor and sensory. Reflexes are 2+.  EXTREMITIES: No edema. No cyanosis. No clubbing.  SKIN: Warm to  touch. Normal turgor. No rashes. No lesions.  MUSCULOSKELETAL: No joint effusion, tenderness,  erythema.  PSYCHIATRIC: Mood and affect could not be elicited.   LABORATORY AND IMAGING STUDIES: Urinalysis: Cloudy in appearance. Glucose greater than 500, ketones are trace, blood 2+, nitrates negative, leukocyte esterase 2+, protein 100 mg/dL. Lactic acid is elevated at 1.40. PT is 13.8, INR 1.1. CBC: WBC 10.9, hemoglobin 14.7, hematocrit is normal. Platelets are normal. MCV at 83. Troponin less than 0.02. LFTs are normal except albumin of 2.8. Chem-8: Glucose 208, BUN 26, creatinine 1.62, sodium is 131, potassium 4.2, chloride 97, CO2 22. GFR 32. Anion gap, serum osmolality and calcium are normal. Magnesium is at 1.5, phosphorus 2.5. Portable chest x-ray: No active cardiopulmonary disease. A 12-lead EKG: Sinus tachycardia at 134 beats per minute, nonspecific ST-T wave changes, left axis deviation.   ASSESSMENT AND PLAN: A 74 year old Caucasian female, brought into the Emergency Department from home with a chief complaint of fever, nausea, generalized weakness, urinary frequency and foul-smelling urine. Will be admitted with the following assessment and plan.   1.  Sepsis secondary to acute cystitis. Blood cultures and urine cultures were obtained in the Emergency Department. The patient will be given cefepime, as she failed on outpatient antibiotics multiple times. We will provide her IV fluids.  2.  Hypomagnesemia. Magnesium sulfate 2 grams IV was given in the Emergency Department. 3.  Chronic history of Addison disease. The patient is on a hydrocortisone at home. We will provide her stress dose steroids with Solu-Cortef 100 mg IV and continue q.8h.  4.  Chronic history of gastroesophageal reflux disease. We will provide her ranitidine.  5.  Diabetes mellitus. Currently, the patient is lethargic, so will provide her insulin sliding scale. 6.  History of hypertension. Blood pressure is low normal. Will monitor, and, if necessary, we will hold off on her antihypertensive medications.  7.  Acute  kidney injury. We will provide her IV fluids. At this point, we will hold off on lisinopril.   CODE STATUS: The patient being lethargic, I was unable to discuss code status with the patient. Until code status is known, she will be full code. The patient will be transferred to Dr. Marcelino Scot in a.m.   TOTAL TIME SPENT: 50 minutes.    ____________________________ Nicholes Mango, MD ag:cg D: 07/14/2013 00:14:24 ET T: 07/14/2013 02:35:48 ET JOB#: [id number]  cc: Nicholes Mango, MD, <Dictator> Tracie Harrier, MD  Nicholes Mango MD ELECTRONICALLY SIGNED 07/25/2013 2:12

## 2014-05-02 NOTE — Consult Note (Signed)
Chief Complaint:   Subjective/Chief Complaint no n/v or abd pain, no bm. stable   VITAL SIGNS/ANCILLARY NOTES: **Vital Signs.:   11-Feb-13 14:08   Vital Signs Type Routine   Temperature Temperature (F) 98.1   Celsius 36.7   Temperature Source oral   Pulse Pulse 99   Pulse source per Dinamap   Respirations Respirations 20   Systolic BP Systolic BP 270   Diastolic BP (mmHg) Diastolic BP (mmHg) 97   Mean BP 122   BP Source Dinamap   Pulse Ox % Pulse Ox % 95   Pulse Ox Activity Level  At rest   Oxygen Delivery 2L   Brief Assessment:   Cardiac Regular    Respiratory clear BS    Gastrointestinal details normal Soft  Nontender  Nondistended  No masses palpable  Bowel sounds normal   Routine Coag:  09-Feb-13 10:31    INR 1.1  Routine Hem:  11-Feb-13 05:04    WBC (CBC) 4.1   RBC (CBC) 3.45   Hemoglobin (CBC) 10.3   Hematocrit (CBC) 31.2   Platelet Count (CBC) 239   MCV 90   MCH 29.8   MCHC 33.0   RDW 15.5  Routine Chem:  11-Feb-13 05:04    Glucose, Serum 163   BUN 30   Creatinine (comp) 1.43   Sodium, Serum 148   Potassium, Serum 3.8   Chloride, Serum 114   CO2, Serum 21   Calcium (Total), Serum 7.8   Osmolality (calc) 304   eGFR (African American) 47   eGFR (Non-African American) 39   Anion Gap 13  Routine Hem:  11-Feb-13 05:04    Neutrophil % 86.6   Lymphocyte % 9.5   Monocyte % 3.9   Eosinophil % 0.0   Basophil % 0.0   Neutrophil # 3.6   Lymphocyte # 0.4   Monocyte # 0.2   Eosinophil # 0.0   Basophil # 0.0  Blood Glucose:  11-Feb-13 07:36    POCT Blood Glucose 134    11:11    POCT Blood Glucose 118   Radiology Results: [place]:    10-Feb-13 15:34, US Kidney Bilateral   US Kidney Bilateral    REASON FOR EXAM:    renal failure, reports urinary retention in tyhe   apst, r/o obstruction, hydro  COMMENTS:       PROCEDURE: [place]  - US KIDNEY  - Feb 18 2011  3:34PM     RESULT: The kidneys are normal in contour. The right kidney measures 11.8   x 4.7 x  4.9 cm. The left kidney measures 10.8 x 4.4 x 4.5 cm. There is no   evidence of hydronephrosis. There is an 10mm diameter cyst in the mid to   upper pole of the right kidney. The echotexture of the renal cortex on   the right is equal to or slightly lower than that of the adjacent liver   which may reflect medical renal disease. The urinary bladder is   moderately distended. A small amount of debris is noted to be floating in   the urinary bladder lumen.    IMPRESSION:   1. I do not see evidence of urinary tract obstruction.  2. The renal cortical echotexture on the right is slightly increased, but   it remains equal to or below that of the adjacent liver. This could   reflect medical renal disease.  3. There is evidence of mobile echogenic debris within the urinary   bladder.  Verified By: DAVID A. Martinique, M.D., MD   Assessment/Plan:  Assessment/Plan:   Assessment 1) reported black stool and coffeeground emesis. no overt bleeding since admission    Plan 1) egd today.  I have discussed the risks bemndfits and complications of  egd to include not limited to bleeding infection perforation and sedation and she wishes to proceed.  further recs to follow.   Electronic Signatures: Loistine Simas (MD)  (Signed 11-Feb-13 15:05)  Authored: Chief Complaint, VITAL SIGNS/ANCILLARY NOTES, Brief Assessment, Lab Results, Radiology Results, Assessment/Plan   Last Updated: 11-Feb-13 15:05 by Loistine Simas (MD)

## 2014-05-02 NOTE — Consult Note (Signed)
PATIENT NAME:  Jordan Brewer, SPELTZ MR#:  [id number] DATE OF BIRTH:  1940/06/20  DATE OF CONSULTATION:  02/18/2011  REFERRING PHYSICIAN:  Dr. Royden Purl  CONSULTING PHYSICIAN:  Lollie Sails, MD  REASON FOR CONSULTATION: Hematemesis.   HISTORY OF PRESENT ILLNESS: Ms. Jordan Brewer is a 74 year old Caucasian female who was admitted to the hospital yesterday with reported hematemesis. She states that she was feeling well on Wednesday. Then she woke up midway through the night of Thursday/early morning with an episode of emesis. There was a lot of dark material in this but no bright bleeding. She had multiple episodes of emesis from Thursday through Saturday. At that time her family members came to see her, saw some darkness to the emesis, and brought her to the Emergency Room. She has not had any recurrent emesis since she came to the hospital. There is no history of peptic ulcer disease. Currently she denies any nausea, vomiting, or abdominal pain. Prior to these episodes she had no nausea, vomiting, or abdominal pain. She has been having a lot of heartburn and had been placed on some Nexium by her primary physician, this then changed to omeprazole due to cost factors. She states that she has been taking omeprazole since November. However, also she has been taking Motrin at least twice a day since October as well. She states she saw 1 black bowel movement about 1 or 2 weeks ago. She had a hip replacement in July 2012. She has had a lot of problems with heartburn and reflux. There has been no dysphagia. She has had a small drop in her hemoglobin with rehydration in the hospital.   PAST MEDICAL HISTORY:  1. History of Addison's disease.  2. Bipolar disorder.  3. Diabetes.  4. Hypertension.  5. Hyperlipidemia.  6. Anxiety.  7. Neuropathy.  8. Gastroesophageal reflux. 9. Depression.  10. She underwent a sigmoid colectomy with initially Hartman's pouch procedure for acute diverticulitis. Subsequently, her ostomy  was reversed.  11. She also had left hip surgery/hip replacement in July 2012.   ALLERGIES: She is allergic to codeine and Phenergan.   CURRENT MEDICATIONS:  1. Hydrocodone/acetaminophen 7.5/325, one every eight hours p.r.n.  2. Alprazolam 0.25 mg every eight hours p.r.n.  3. Amlodipine 10 mg once a day.  4. Enteric-coated aspirin daily, dose uncertain. 5. Coreg 6.25 mg twice a day. 6. Cymbalta 60 mg daily.  7. Flexeril 10 mg every eight hours p.r.n.  8. Fludrocortisone 0.1 mg every morning.  9. Hydrocortisone 10 mg orally, three in the morning, two in the evening.  10. Lisinopril 20 mg twice a day. 11. Lovastatin 40 mg daily. 12. Meloxicam 7.5 mg, one twice a day.  13. Metformin 1000 mg twice a day.  14. Metoprolol 50 mg q. a.m.  15. MiraLAX for constipation.  16. Neurontin 300 mg, 1 three times a day.  17. Omeprazole 40 mg once a day.  18. TriCor 145 mg once a day. 19. Tylenol Extra Strength p.r.n.  20. Zofran 4 mg every four hours p.r.n.   SOCIAL HISTORY: She lives alone. She does not smoke or drink.   GI FAMILY HISTORY: Negative for colorectal cancer, liver disease, or ulcers.   REVIEW OF SYSTEMS: 10 systems reviewed per admission History and Physical, agree with same.   PHYSICAL EXAMINATION:  VITAL SIGNS: Temperature 98.2, pulse 85, respirations 20, blood pressure 114/68, pulse oximetry 100%.   GENERAL: She is a well-appearing 74 year old Caucasian female in no acute distress.   HEAD: Normocephalic, atraumatic.  EYES: Anicteric.   NOSE: Septum midline. No lesions.   OROPHARYNX: No lesions.   NECK: Supple. No JVD.   HEART: Regular rate and rhythm.   LUNGS: Bilaterally clear.   ABDOMEN: Soft, nontender, and nondistended. Bowel sounds positive, normoactive.   RECTAL: Anorectal exam is deferred.   EXTREMITIES: No clubbing, cyanosis, or edema.   NEUROLOGICAL: Cranial nerves II through XII grossly intact. Muscle strength bilaterally equal and symmetric. Deep  tendon reflexes bilaterally equal and symmetric.   LABORATORY, DIAGNOSTIC, AND RADIOLOGICAL DATA: On admission to the hospital she had a glucose of 166, BUN 37, creatinine 1.88, sodium 136, potassium 3.9, chloride 97, bicarbonate 20, calcium 10.1, amylase 50, lipase 101. Hepatic profile showing a total protein of 8.8, albumin 3.1, total bilirubin 0.5, alkaline phosphatase 58, AST 20, ALT 14. Her hemogram showed a white count of 12.2, hemoglobin/hematocrit 15.4/47.3, platelet count 274, normal indices. She has had two hemoglobins checked since admission, one at 11.8 and the most recent 11.3. She has a proTime of 14.4, INR 1.1, activated PTT of 42.8. She has had blood cultures times two, no growth. She had a urinalysis showing leukocyte esterase 2+, WBCs 162 per high-power field.   ASSESSMENT: Reported hematemesis in the setting of urinary tract infection. The patient has been taking NSAIDs but has been also on a PPI. There is no history of peptic ulcer disease. She has had a black bowel movement as well as the reported coffee-ground type emesis. Currently she is hemodynamically stable and on antibiotics. She is also on medications for her Addison's disease.     RECOMMENDATIONS:  1. We will pursue EGD tomorrow. I have discussed the risks, benefits, and complications of EGD to include but not limited to bleeding, infection, perforation, and the risks of sedation, and she wishes to proceed.  2. Continue b.i.d. IV PPI.  3. Daily hemoglobin.  4. Further recommendations to follow.     ____________________________ Lollie Sails, MD mus:bjt D: 02/18/2011 17:16:57 ET T: 02/19/2011 05:37:33 ET JOB#: [id number]  cc: Lollie Sails, MD, <Dictator> Lollie Sails MD ELECTRONICALLY SIGNED 03/05/2011 9:15

## 2014-05-02 NOTE — Consult Note (Signed)
Brief Consult Note: Diagnosis: hematemesis.   Patient was seen by consultant.   Consult note dictated.   Recommend to proceed with surgery or procedure.   Orders entered.   Comments: Pateint seen and examined, please see full dictated consult [phone].  Pateitn admitted with n/v in the setting of uti.  Reported coffeeground type material in emesis, no brb.  Patient on daily nsids and ppi at home. Continue ppi, will arrange for egd tomorrow.  I have discussed the risks benefits and complications of egd to include not limited to bleeding infection perforation and sedation and she wishes to proceed.  Electronic Signatures: Loistine Simas (MD)  (Signed 10-Feb-13 17:19)  Authored: Brief Consult Note   Last Updated: 10-Feb-13 17:19 by Loistine Simas (MD)

## 2014-05-02 NOTE — Discharge Summary (Signed)
PATIENT NAME:  Jordan Brewer, Jordan Brewer MR#:  [id number] DATE OF BIRTH:  11-16-1940  DATE OF ADMISSION:  02/17/2011 DATE OF DISCHARGE:  02/20/2011  ADMITTING DIAGNOSIS: Nausea, vomiting, hematemesis.  DISCHARGE DIAGNOSES:  1. Hematemesis of unclear etiology at this time status post esophagogastroduodenoscopy on 02/19/2011 which did not show any source of bleeding by Dr. Gustavo Lah. Normal barium swallowing test and small bowel follow-through.  2. Acute posthemorrhagic anemia with hemoglobin 9.3 on 02/20/2011.  3. Acute renal failure, resolved.  4. Urinary tract infection, questionable acute pyelonephritis. Escherichia coli, sensitivities to follow.  5. Premature atrial contractions likely to due to hypomagnesemia, resolved.  6. Metabolic lactic acidosis likely due to acute renal failure versus metformin related.  7. Diabetes mellitus. 8. Addison's disease.  9. Hypotension, likely due to systemic inflammatory response reaction versus Addison's disease, adrenal crisis related.  10. Insomnia.   DISCHARGE CONDITION: Stable.   DISCHARGE MEDICATIONS: The patient is to resume her outpatient medications which are:  1. TriCor 145 mg p.o. daily.  2. Amlodipine 10 mg p.o. daily.  3. Cymbalta 60 mg p.o. daily.  4. Fludrocortisone 0.1 mg p.o. daily.  5. Tylenol 500 to 1000 mg every four hours as needed.  6. Alprazolam 0.25 mg p.o. every eight hours as needed.  7. Neurontin 300 mg 3 times daily.  8. Acetaminophen hydrocodone 325 mg/7.5 mg, 1 tablet every eight hours as needed.  9. Hydrocortisone 30 mg in the morning and 20 mg in the afternoon.  10. Mevacor 40 mg p.o. at bedtime.  11. Sliding scale insulin.  12. Ambien 5 mg p.o. at bedtime as needed.  13. Omeprazole 40 mg p.o. twice daily.  14. Zofran 4 mg every four hours as needed.  15. Keflex 500 mg p.o. 3 times daily for 10 more days.   The patient is not to take aspirin, metformin, meloxicam, metoprolol, lisinopril, or alendronate  until recommended  by primary care physician, Dr. Rutherford Nail.    HOME OXYGEN: None.   DIET:  Soft, liquid, to be advanced to regular 1800 ADA diet slowly over the next few days. Low salt, low fat.   FOLLOWUP:  1. Follow-up appointment with Dr. Rutherford Nail two days after discharge. 2. The patient is also to follow up with Dr. Gustavo Lah one week after discharge.   CONSULTANTS:  1. Dr. Gustavo Lah. 2. Dr. Anthonette Legato. 3. Care management.   LABORATORY, DIAGNOSTIC, AND RADIOLOGICAL DATA:  1. Chest portable single view x-ray 02/17/2011: No evidence of pneumonia or acute cardiopulmonary abnormality.  2. Small bowel follow-through 02/20/2011: Unremarkable small bowel follow-through series. 3. Ultrasound of the kidneys bilateral: No evidence of urinary tract obstruction. Renal cortical echotexture on the right is slightly increased but it remains equal to or below that of adjacent liver. This could reflect medical renal disease.  Evidence of mobile echogenic debris within the urinary bladder.  HISTORY OF PRESENT ILLNESS:  The patient is a 74 year old female with past medical history significant for history of diabetes, Addison's disease, bipolar disorder, hypertension, and hyperlipidemia who presented to the hospital with complaints of nausea, vomiting, abdominal pain, hematemesis, and was diagnosed with acute renal failure. Please refer to Dr. Ashok Croon  admission note on 02/17/2011. On arrival to the hospital the patient's vitals showed a temperature of 99.4 up to one 100.4, heart rate 145, blood pressure 117/56, saturation was 100% on two liters. Respiratory rate was 20.  Physical exam revealed tachycardia. No lower extremity edema was noted. The patient's abdominal exam was unremarkable.   LABORATORY DATA: 02/17/2011:  Glucose 166, BUN and creatinine were 38 and 1.88. The patient's bicarbonate level was low at 20. Estimated GFR for non-African American was 28. Anion gap was high at 19. Lipase 101. Amylase 50. Liver enzymes:  Albumin of 3.1, otherwise unremarkable study. White blood cell count was elevated to 12.2, hemoglobin 15.4, platelet count 278. Coagulation panel showed proTime 14.4, INR 1.1, and activated PTT was 42.8. Blood cultures times two did not show any growth on 02/17/2011.    Urinalysis showed amber cloudy urine, more than 500 glucose, negative for bilirubin, trace ketones. Specific gravity was 1.021, pH 5,  2+ blood, more than 500 protein, negative for nitrites, 2+ leukocyte esterase noted. 8 red blood cells, 162 white blood cells, 3+ bacteria were noted and 2 epithelial cells. Mucous as well as budding yeast was present.   ABGs were done on 28% FiO2 and showed pH of 7.4, pCO2 29, pO2 119, bicarbonate level 18, oxygen saturation was 97.7. Lactic acid level was 0.9.   EKG: Normal sinus rhythm at 118 beats per minute, normal axis. No acute ST-T changes were noted.   HOSPITAL COURSE: The patient was admitted to the hospital because of concerns of urinary tract infection, even pyelonephritis, and acute renal failure. She was started on IV fluids as well as antibiotic therapy. Because of concerns of hematemesis she was also started on PPI twice daily IV. Consultation with Dr. Gustavo Lah was obtained.  Dr. Gustavo Lah saw the patient in consultation the next day on 02/18/2011. He felt that the patient had hematemesis. He recommended proceeding with EGD.  Reported coffee-ground type of emesis.  No bright red blood. The patient was on daily nonsteroidal anti-inflammatory medications at home as well as aspirin. Recommended to continue PPIs as well as consider EGD on 02/19/2011. He explained the risks, benefits, and complications of EGD and the patient's family and  the patient wanted to proceed. The patient proceeded to upper GI endoscopy on 02/19/2011 by Dr. Gustavo Lah. It was actually unremarkable. According to Dr. Marton Redwood report, the patient's examined esophagus was normal. A small hiatal hernia was found. The Z-line was a  variable distance from incisors. Hiatal hernia with sliding. Entire examined stomach was normal. Cardia as well as gastric fundus were normal on retroflexion. Examined duodenum was normal.  Impression: Normal esophagus, hiatal hernia, normal stomach, normal examined duodenum. He recommended getting a small bowel series and checking Hemoccults. The patient did quite well after admission to the hospital. She as mentioned above was started on proton pump inhibitor IV and was kept n.p.o. on a clear liquid diet. She did not have any more nausea, vomiting, diarrhea, black or tarry-looking stools, or hematemesis.  As mentioned above, she underwent EGD which was unremarkable. She was advanced to full liquid diet. However, with rehydration she was noted to have mild anemia. On 02/17/2011 on admission the patient was dehydrated. Hemoglobin level was 15.4, hemoconcentrated. However, already with rehydration on the same day in the evening the patient's hemoglobin level was 11.8. Hemoglobin level remained stable on 02/18/2011. By the morning, however, it dropped down to 10.3 on 02/19/2011 at 5:00 a.m. It was 9.3 on 02/20/2011 with no additional bleeding. Patient's guaiac of stool was also found to be positive. However, no frank blood or black or tarry-looking stools were identified. It was felt, however, that the patient was okay to advance to a full liquid diet, which was done on 02/20/2011, and, as the patient did not have any problems with this advanced diet, the decision was made  to discharge her home. She was, however, advised not to take any nonsteroidal anti-inflammatory medications or aspirin at home. She is to continue proton pump inhibitors and follow up with her primary care physician and Dr. Gustavo Lah as outpatient. The patient underwent  barium swallow and small bowel follow-through, which was also unremarkable.   As mentioned above, the patient had mild posthemorrhagic anemia but mostly it looked like her  hemoglobin drifted down because she was so very dehydrated and because of rehydration her hemoglobin dropped down; however, it was not related to necessarily ongoing hemorrhage. Hemoglobin was 9.3 on the day of discharge, 02/20/2011.  It is recommended to follow the patient's hemoglobin level in the next few days after discharge to ensure stable level. The patient's iron studies were performed while she was in the hospital and iron serum level was found to be 73. The patient's iron saturation was 24. It was felt that the patient had anemia of chronic disease and no iron supplements were recommended at this time.   The patient was noted to have acute renal failure. Ultrasound showed mild abnormalities consistent with medical disease, however no hydronephrosis. It was felt that patient very likely had acute pyelonephritis. Her urine cultures were taken and were growing gram-negative rod. Sensitivities are to follow. The patient is to continue antibiotic therapy with Keflex for 10 days to complete the course.  However, if she has recurrent symptomatology her primary care physician is to follow up gram-negative rod identification as well as sensitivities.   The patient had acute renal failure which was followed by nephrologist in the hospital with IV fluids. The patient's BUN and creatinine normalized. On 02/20/2011 the patient's BUN and creatinine were 13 and 0.88 respectively. It is recommended to follow the patient's BUN and creatinine levels as an outpatient, especially if she is not taking good p.o. or if she has recurrent symptoms of urinary tract infection.  She is to follow up with Dr. Holley Raring if needed. Dr. Holley Raring followed the patient along while she was in the hospital. As mentioned above, ultrasound of the kidneys was checked and was found to be mildly abnormal. The patient's right kidney echotexture was found to be slightly increased; however, it remained somewhat equal or below the adjacent liver, which  could reflect medical renal disease according to the radiologist. The patient underwent studies to evaluate the nature of her kidney disease.  She had creatinine level  checked in her urine, which was found to be 28.2. The patient's protein/ creatinine ratio was found to be 426 and protein level random in the urine was found to be 12. The patient also had antiglomerular basement membrane checked which was found to be 4, within normal limits. ANCA panel was unremarkable.  Complement  C3 in serum level was found to be normal at 115. Complement C4 was found to be normal at 23.  Eosinophil in urine level was normal. No eosinophils were seen.  ANA was negative. RPR was also nonreactive and protein electrophoresis M spike was not observed.  Free kappa and lambda chains were normal.   In regards to Addison's disease, initially the patient required high doses of steroid administration because of concerns of Addison failure. Hypotension, tachycardia, and acidosis were noted. However, later on when her condition improved the patient was restarted on her usual Addison's medications including hydrocortisone and fludrocortisone.   In regards to diabetes mellitus, the patient's metformin was placed on hold due to lactic ketoacidosis as well as acute renal failure. It  is recommended to follow the patient's oral intake and resume metformin whenever oral intake is back to normal.   The patient was noted to be hypotensive initially on arrival to the Emergency Room. It was felt to be very likely systemic inflammatory response reaction related to urinary tract infection and possible acute pyelonephritis. The patient's hypotension resolved with IV fluid administration.  For insomnia the patient was prescribed Ambien.  The patient is being discharged in stable condition with the above-mentioned medications and followup. Her vital signs on the day of discharge: Temperature 97.9, pulse 103, respiration rate 20, blood pressure  159/73, saturation 100% on room air at rest.   TIME SPENT: 40 minutes.   ____________________________ Theodoro Grist, MD rv:bjt D: 02/20/2011 20:02:06 ET T: 02/21/2011 10:25:29 ET JOB#: [id number]  cc: Theodoro Grist, MD, <Dictator> Ashok Norris, MD [place] MD ELECTRONICALLY SIGNED 02/26/2011 12:00

## 2014-05-02 NOTE — H&P (Signed)
PATIENT NAME:  Jordan Brewer, Jordan Brewer MR#:  [id number] DATE OF BIRTH:  Dec 15, 1940  DATE OF ADMISSION:  02/17/2011  PRIMARY CARE PHYSICIAN: Ashok Norris, MD   REFERRING PHYSICIAN: ER physician, Dr. Marta Antu    REASON FOR ADMISSION: Nausea, vomiting, abdominal pain, possible GI bleed with dehydration and acute renal failure.   HISTORY OF PRESENT ILLNESS: This is a 74 year old white female with past medical history of Addison's disease, bipolar disorder, hypertension, hyperlipidemia, and diabetes who was brought in by her family with nausea, vomiting, and abdominal pain. The patient three days ago ate lunch, went to bed and woke up with persistent vomiting throughout the night and for the last couple of days. She said that her family came by and noticed that the vomit looked like it had blood in it, dark content. No chest pain, shortness of breath, or abdominal pain. She has had persistent nausea and vomiting. She did not vomit today but has had nausea. She said she did have some subjective fevers and chills. In the ER, she was examined and had abdominal pain on examination. She had chest x-ray and labs drawn. Zofran was given and IV fluids. She says she is still nauseous, very weak. She has had nothing to eat for the last three days and was found to have elevated creatinine. We are asked to admit the patient for possible viral gastroenteritis complicated by acute renal failure.  PAST MEDICAL HISTORY:  1. Addison's disease. 2. Bipolar disorder. 3. Diabetes. 4. Hypertension. 5. Hyperlipidemia. 6. Anxiety.  7. Neuropathy.  8. Gastroesophageal reflux disease. 9. Depression.   PAST SURGICAL HISTORY:  1. Diverticulitis Hartmann's procedure. 2. Ostomy reversal.  3. Recent left hip surgery in July of 2012.   ALLERGIES: Codeine and Phenergan.   MEDICATIONS:  1. Prilosec 40 mg. 2. Lovastatin 40 mg. 3. Norvasc 10 mg daily.  4. Cymbalta 60 mg daily. 5. Coreg 6.25 mg daily. 6. Lovastatin 40 mg  daily. 7. TriCor 145 mg daily.  8. Metformin 500 mg p.o. twice daily.  9. Alprazolam 0.25 mg q.8 hours p.r.n. anxiety.  10. Lasix 40 mg daily. 11. Zofran over-the-counter. 12. Neurontin 300 mg t.i.d.  13. Tylenol p.r.n.   SOCIAL HISTORY: She lives alone. She is widowed. She has three healthy adult children. 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 She states she is able do her activities of daily living. Does not smoke, drink, or use illicit drugs.   FAMILY HISTORY: Her mother passed away in her 03'E of CVA complicated by an MI. Father died in mid 43's of lung cancer.   REVIEW OF SYSTEMS: CONSTITUTIONAL: She has fatigue, weakness, and had subjective fevers which have now resolved. EYES: No blurred vision, double vision, pain, redness, inflammation, glaucoma. ENT: No tinnitus, ear pain, hearing loss, seasonal allergies, epistaxis, or discharge. RESPIRATORY: No cough, wheezing, hemoptysis, dyspnea, asthma, painful respirations. CARDIOVASCULAR: No chest pain, orthopnea, edema, arrhythmia, dyspnea on exertion, palpitations. GI: She has nausea and vomiting. No diarrhea. She has abdominal pain on exam. No hematemesis, melena, or gastroesophageal reflux disease. GU: No dysuria, hematuria, renal calculi, increased frequency, incontinence. GYN: No breast mass, tenderness, or vaginal discharge. She is postmenopausal. ENDOCRINE: No polyuria, nocturia, thyroid problems, increased sweating, heat or cold intolerance. HEME/LYMPH: No anemia, easy bruising, or swollen glands. INTEGUMENTARY: No acne, rash, change in mole, hair or skin. MUSCULOSKELETAL: No pain in back, shoulder, knee, hip, arthritis, swelling, gout. NEUROLOGIC: No numbness, weakness, dysarthria, epilepsy, tremor, vertigo, ataxia. PSYCH: No anxiety,  insomnia, ADD, bipolar, or depression.   PHYSICAL EXAMINATION:   VITAL SIGNS: Temperature 99.4, rose to 100.4, heart rate 145, blood pressure 117/56, sating 100% on 2 liters,  respiratory rate 20.   GENERAL: The patient is well developed, well nourished in mild discomfort.   HEENT: Pupils equal, reactive to light and accommodation. Extraocular movements intact. Anicteric sclerae. No difficulty hearing. No conjunctivitis. She has dry mucous membranes. No thyromegaly. No lymphadenopathy. No carotid bruits.   RESPIRATORY: Clear to auscultation. No adventitious breath sounds. Resonant to percussion. No increased effort or use of accessory muscles.   CARDIOVASCULAR: Tachycardic. No murmurs, gallops, or rubs appreciated. PMI is not lateralized. She has good dorsalis pedis pulses, +2. No lower extremity edema.   BREASTS: No obvious masses. She does have breast implants.   ABDOMEN: Soft, nontender, nondistended. Positive bowel sounds. No organomegaly. No hernia.   GU: Deferred.   MUSCULOSKELETAL: Strength 5/5. No clubbing, cyanosis, or degenerative joint disease.   SKIN: No rashes, lesions, or induration. She does have some mild tenting of the skin.   LYMPH: No lymphadenopathy in the cervical area.   NEUROLOGIC: Cranial nerves II through XII are intact. She is able to follow commands. No focal deficits, dysarthria, aphasia, dysphagia, or contractures.   PSYCH: Alert and oriented x3. She sometimes is a poor historian.   LABORATORY, DIAGNOSTIC, AND RADIOLOGICAL DATA: Glucose 166, BUN 37, creatinine 1.88, baseline creatinine 1.03, sodium 136, potassium 3.9, chloride 97, bicarb 20, anion gap 19. Amylase 50. Total protein 8.8, albumin 3.1, total bilirubin 0.5, alkaline phosphatase 58, AST 20, ALT 14, WBC 12.2, hemoglobin 15.4, hematocrit 47.3, platelets 278, MCV 90. INR 1.1. Chest x-ray on my examination showed no acute cardiopulmonary process. EKG showed sinus tachycardia with no ST changes.   ASSESSMENT AND PLAN: This is a pleasant 74 year old white female with past medical history of hypertension, diabetes, Addison's disease, and bipolar disorder who presents with  nausea, vomiting, hematemesis. 1. Nausea, vomiting, hematemesis. I am not sure if she truly had hematemesis but we will try to get Hemoccult of any vomitus or bowel movement. She possibly has viral gastroenteritis. She does have a history though of diverticulitis with Hartmann's pouch so will cycle her hemoglobin q.8 hours. Will have GI consulted. Put her on p.r.n. Zofran, IV fluids, and with her history of diverticulitis we will prophylactically treat her with Cipro and Flagyl. In addition, will monitor on off-unit telemetry. Put two large-bore IV access in case she truly does have a GI bleed.  2. Tachycardia, dehydration. Will give IV fluids and stress dose steroids.  3. Anion gap acidosis. Will check acetone, lactic acid, and ABG. Will repeat BMP. I am hopeful that this will improve with IV fluid hydration and control of her nausea and vomiting.  4. Acute renal failure. Will hold her metformin, lisinopril, and give IV fluids. Likely this is prerenal. Will follow-up with ABG later this evening and in the morning. 5. History of hypertension. She is currently mildly hypotensive. Will hold amlodipine and write for p.r.n. Lopressor. 6. Diabetes. Hold metformin because of acute renal failure and put the patient on sliding scale insulin and D5 half normal saline for IV fluids.  7. Gastroesophageal reflux disease. Will continue with IV Protonix.  8. Leukocytosis. This may be from viral gastroenteritis. Repeat CBC in the morning. Check urinalysis.  9. Low albumin. Will screen for malnutrition.  10. Bipolar. Continue Cymbalta.  11. DVT prophylaxis. Maintain with TED stockings and SCDs.   CODE STATUS: FULL CODE.  The patient's daughter is by bedside and agreeable to the plan.    TOTAL TIME SPENT ON ADMISSION: 60 minutes.   ____________________________ Judeth Horn Royden Purl, MD aaf:drc D: 02/17/2011 14:01:12 ET T: 02/17/2011 14:16:02 ET JOB#: [id number]  cc: Mike Craze A. Royden Purl, MD, <Dictator> Ashok Norris,  MD Mike Craze Cassell Clement MD ELECTRONICALLY SIGNED 02/18/2011 14:49

## 2014-05-02 NOTE — Consult Note (Signed)
PATIENT NAME:  Jordan Brewer, Jordan Brewer MR#:  [id number] DATE OF BIRTH:  05/26/1940  DATE OF CONSULTATION:  02/19/2011  REFERRING PHYSICIAN:  Deanne Coffer, MD CONSULTING PHYSICIAN:  Ellinor Test Lilian Kapur, MD  REASON FOR CONSULTATION: Acute renal failure.   HISTORY OF PRESENT ILLNESS: The patient is a pleasant 74 year old Caucasian female with past medical history of Addison's disease, bipolar disorder, diabetes mellitus, hypertension, hyperlipidemia, anxiety, peripheral neuropathy, gastroesophageal reflux disease, diverticulitis status post Hartmann's procedure, ostomy reversal, left hip surgery in 07/2010, who presented to [facility] with complaints of coffee-ground emesis. The patient reports that these symptoms of coffee-ground emesis began last Wednesday. She had several days of this that persisted. However, she sought care on 02/17/2011. The patient describes her vomitus as quite dark in color. She denied ingestion of NSAIDs while at home. She is going for esophagogastroduodenoscopy today. We are consulted for the evaluation and management of acute renal failure. The patient has a normal baseline creatinine of 0.74 from 07/29/2010. Upon presentation this time the patient's creatinine was 1.8. Creatinine peaked at 2.07 and is now down to 1.43. Serum sodium is also noted to be high at 148. The patient appears to be mildly anemic with a hemoglobin of 10.3 at present. She had a urinalysis performed which showed urine protein greater than 500 mg/dL. There was also significant pyuria with WBCs of 162. The patient also has significant glucosuria with a urine glucose greater than 500 mg/dL.   PAST MEDICAL HISTORY:  1. Diabetes mellitus.  2. Hypertension.  3. Hyperlipidemia.  4. Anxiety.  5. Peripheral neuropathy.  6. Gastroesophageal reflux disease.  7. Bipolar disorder.  8. Addison's disease.   PAST SURGICAL HISTORY:  1. Diverticulitis status post Hartmann's procedure.  2. History of  ostomy reversal.  3. Left hip surgery in 07/2010.   ALLERGIES: Codeine and Phenergan.   CURRENT INPATIENT MEDICATIONS:  1. Alprazolam 0.25 mg p.o. every eight hours p.r.n.  2. Ciprofloxacin 200 mg IV every 12 hours.  3. Cymbalta 60 mg p.o. daily.  4. TriCor 145 mg p.o. daily.  5. Neurontin 300 mg p.o. t.i.d.  6. Sliding scale insulin.  7. Lovastatin 40 mg p.o. daily.  8. Metoprolol 5 mg IV every six hours p.r.n.  9. Protonix 40 mg IV every 12 hours.  10. Tylenol 650 mg p.o. every four hours p.r.n.  11. Solu-Cortef 100 mg IV every 12 hours.  12. Ambien 5 mg p.o. at bedtime.   SOCIAL HISTORY: The patient reports living alone. She is widowed. She has three children. She denies tobacco, alcohol, or illicit drug use.   FAMILY HISTORY: Mother deceased secondary to myocardial infarction. Father died secondary to lung cancer and had history of significant tobacco abuse.   REVIEW OF SYSTEMS: CONSTITUTIONAL: Denies fevers, chills, or weight loss. EYES: Denies diplopia or blurry vision. HEENT: Denies headaches or hearing loss. Denies epistaxis or sore throat. RESPIRATORY: Denies cough, shortness of breath, or wheezing. CARDIOVASCULAR: Denies chest pain, palpitations, PND, or orthopnea. GASTROINTESTINAL: Reports no abdominal pain. However, was having significant coffee-ground emesis at home. GU: Denies frequency, urgency, or dysuria. ENDOCRINE: Denies polyuria, polydipsia, or polyphagia. MUSCULOSKELETAL: Had history of left hip surgery. However, pain has resolved quite well from that. SKIN: Denies skin rashes or lesions. NEUROLOGIC: Denies focal extremity numbness, weakness, or tingling. PSYCHIATRIC: Has history of bipolar disorder. HEMATOLOGIC/LYMPHATIC: Denies easy bruisability, bleeding, or swollen lymph nodes. ALLERGY/IMMUNOLOGIC: Denies seasonal allergies or history of immunodeficiency.   PHYSICAL EXAMINATION:  VITAL SIGNS: Temperature 98, pulse 81,  respirations 20, blood pressure 158/81.    GENERAL: Well-developed, well-nourished Caucasian female who appears her stated age, currently in no acute distress.   HEENT: Normocephalic, atraumatic. Extraocular movements are intact. Pupils equal, round, and reactive to light. No scleral icterus. Conjunctivae are pink. No epistaxis noted. Gross hearing intact. Oral mucosal are moist. The patient has partial dentures in place.   NECK: Supple without JVD or lymphadenopathy.   LUNGS: Clear to auscultation bilaterally with normal respiratory effort.   HEART: S1, S2. Regular rate and rhythm. No murmurs, rubs, or gallops appreciated.   ABDOMEN: Soft, nontender, nondistended. Bowel sounds positive. No rebound or guarding. No gross organomegaly appreciated.   EXTREMITIES: No clubbing, cyanosis, or edema.   NEUROLOGIC: The patient is alert and oriented to time, person, and place. Strength is five out of five in both upper and lower extremities.   SKIN: Warm and dry. No rashes noted.   MUSCULOSKELETAL: No joint redness, swelling or tenderness appreciated.   PSYCHIATRIC: The patient has an appropriate affect and appears to have good insight into her current illness.   LABORATORY, RADIOLOGICAL AND DIAGNOSTIC DATA: Sodium 148, potassium 3.8, chloride 114, CO2 21, BUN 30, creatinine 1.43, glucose 163. CBC shows WBC of 4.1, hemoglobin 10.3, hematocrit 31, platelets 239. ABG showed pH 7.4, pCO2 29, pO2 119 and FiO2 28%.   IMPRESSION: This is a 74 year old Caucasian female with past medical history of diabetes mellitus, hypertension, hyperlipidemia, anxiety, peripheral neuropathy, gastroesophageal reflux disease, bipolar disorder, Addison's disease, diverticulitis status post Hartmann's procedure with subsequent reversal, left hip surgery in 07/2010 who presented to [facility] with coffee-ground emesis.  1. Acute renal failure. It appears that her renal insufficiency currently is acute. Her baseline creatinine appears to be 0.7  from 07/2010. Most likely, the patient developed ATN due to prolonged dehydration from her coffee-ground emesis as well as poor p.o. intake since Wednesday of last week. Agree with IV fluid hydration. However, we will change the composition of the IV fluids given her hypernatremia. Please see plan below for this. We will proceed with renal ultrasound to insure that there is no underlying obstruction.  2. Hypernatremia. Will discontinue normal saline in favor of half-normal saline at 75 mL per hour.  3. Proteinuria. I suspect that she has underlying diabetic nephropathy as urine protein was greater than 500 mg/dL. However, we will proceed with additional work-up including ANA, ANCA antibodies, GBM antibodies, C3, C4, urine for eosinophils, SPEP, UPEP. Further plan to be determined once these laboratory studies are back. Ideally, we would like to have the patient on an ACE inhibitor. However, will avoid having her on one at this point in time given the acute renal failure.  4. Pyuria. The patient with significant pyuria upon admission. We will check a urine culture. However, this may not be positive given the fact that the patient is currently on ciprofloxacin.   I would like to thank Dr. Royden Purl for this kind referral. Further plan as the patient progresses.   ____________________________ Tama High, MD mnl:ap D: 02/19/2011 12:57:46 ET T: 02/19/2011 13:36:59 ET JOB#: [id number]  cc: Tama High, MD, <Dictator> Tama High MD ELECTRONICALLY SIGNED 03/25/2011 23:54

## 2014-05-05 DIAGNOSIS — E119 Type 2 diabetes mellitus without complications: Secondary | ICD-10-CM | POA: Diagnosis not present

## 2014-05-05 DIAGNOSIS — G894 Chronic pain syndrome: Secondary | ICD-10-CM | POA: Diagnosis not present

## 2014-05-05 DIAGNOSIS — M1711 Unilateral primary osteoarthritis, right knee: Secondary | ICD-10-CM | POA: Diagnosis not present

## 2014-05-05 DIAGNOSIS — I10 Essential (primary) hypertension: Secondary | ICD-10-CM | POA: Diagnosis not present

## 2014-05-10 DIAGNOSIS — G894 Chronic pain syndrome: Secondary | ICD-10-CM | POA: Diagnosis not present

## 2014-05-10 DIAGNOSIS — L538 Other specified erythematous conditions: Secondary | ICD-10-CM | POA: Diagnosis not present

## 2014-05-10 DIAGNOSIS — E11620 Type 2 diabetes mellitus with diabetic dermatitis: Secondary | ICD-10-CM | POA: Diagnosis not present

## 2014-05-10 DIAGNOSIS — I10 Essential (primary) hypertension: Secondary | ICD-10-CM | POA: Diagnosis not present

## 2014-06-03 DIAGNOSIS — L853 Xerosis cutis: Secondary | ICD-10-CM | POA: Diagnosis not present

## 2014-06-03 DIAGNOSIS — L300 Nummular dermatitis: Secondary | ICD-10-CM | POA: Diagnosis not present

## 2014-06-03 DIAGNOSIS — L299 Pruritus, unspecified: Secondary | ICD-10-CM | POA: Diagnosis not present

## 2014-06-03 DIAGNOSIS — L821 Other seborrheic keratosis: Secondary | ICD-10-CM | POA: Diagnosis not present

## 2014-06-16 DIAGNOSIS — E119 Type 2 diabetes mellitus without complications: Secondary | ICD-10-CM | POA: Diagnosis not present

## 2014-06-16 DIAGNOSIS — F419 Anxiety disorder, unspecified: Secondary | ICD-10-CM | POA: Diagnosis not present

## 2014-06-16 DIAGNOSIS — F5104 Psychophysiologic insomnia: Secondary | ICD-10-CM | POA: Diagnosis not present

## 2014-06-16 DIAGNOSIS — S30814A Abrasion of vagina and vulva, initial encounter: Secondary | ICD-10-CM | POA: Diagnosis not present

## 2014-07-06 DIAGNOSIS — M25512 Pain in left shoulder: Secondary | ICD-10-CM | POA: Diagnosis not present

## 2014-07-06 DIAGNOSIS — M25511 Pain in right shoulder: Secondary | ICD-10-CM | POA: Diagnosis not present

## 2014-07-06 DIAGNOSIS — G894 Chronic pain syndrome: Secondary | ICD-10-CM | POA: Diagnosis not present

## 2014-07-15 DIAGNOSIS — L820 Inflamed seborrheic keratosis: Secondary | ICD-10-CM | POA: Diagnosis not present

## 2014-07-15 DIAGNOSIS — L300 Nummular dermatitis: Secondary | ICD-10-CM | POA: Diagnosis not present

## 2014-07-15 DIAGNOSIS — L299 Pruritus, unspecified: Secondary | ICD-10-CM | POA: Diagnosis not present

## 2014-07-15 DIAGNOSIS — L821 Other seborrheic keratosis: Secondary | ICD-10-CM | POA: Diagnosis not present

## 2014-07-15 DIAGNOSIS — L853 Xerosis cutis: Secondary | ICD-10-CM | POA: Diagnosis not present

## 2014-08-20 DIAGNOSIS — M1711 Unilateral primary osteoarthritis, right knee: Secondary | ICD-10-CM | POA: Diagnosis not present

## 2014-08-20 DIAGNOSIS — G894 Chronic pain syndrome: Secondary | ICD-10-CM | POA: Diagnosis not present

## 2014-08-20 DIAGNOSIS — F329 Major depressive disorder, single episode, unspecified: Secondary | ICD-10-CM | POA: Diagnosis not present

## 2014-08-20 DIAGNOSIS — E119 Type 2 diabetes mellitus without complications: Secondary | ICD-10-CM | POA: Diagnosis not present

## 2014-09-24 DIAGNOSIS — J069 Acute upper respiratory infection, unspecified: Secondary | ICD-10-CM | POA: Diagnosis not present

## 2014-11-01 DIAGNOSIS — F419 Anxiety disorder, unspecified: Secondary | ICD-10-CM | POA: Diagnosis not present

## 2014-11-01 DIAGNOSIS — Z23 Encounter for immunization: Secondary | ICD-10-CM | POA: Diagnosis not present

## 2014-11-01 DIAGNOSIS — G894 Chronic pain syndrome: Secondary | ICD-10-CM | POA: Diagnosis not present

## 2014-11-01 DIAGNOSIS — I10 Essential (primary) hypertension: Secondary | ICD-10-CM | POA: Diagnosis not present

## 2014-11-01 DIAGNOSIS — Z794 Long term (current) use of insulin: Secondary | ICD-10-CM | POA: Diagnosis not present

## 2014-11-01 DIAGNOSIS — M25511 Pain in right shoulder: Secondary | ICD-10-CM | POA: Diagnosis not present

## 2014-11-01 DIAGNOSIS — E119 Type 2 diabetes mellitus without complications: Secondary | ICD-10-CM | POA: Diagnosis not present

## 2014-11-13 ENCOUNTER — Emergency Department: Payer: Commercial Managed Care - HMO

## 2014-11-13 ENCOUNTER — Encounter: Payer: Self-pay | Admitting: *Deleted

## 2014-11-13 ENCOUNTER — Inpatient Hospital Stay
Admission: EM | Admit: 2014-11-13 | Discharge: 2014-11-16 | DRG: 536 | Disposition: A | Discharge status: Skilled Nursing Facility | Payer: Commercial Managed Care - HMO | Attending: Internal Medicine | Admitting: Internal Medicine

## 2014-11-13 DIAGNOSIS — E119 Type 2 diabetes mellitus without complications: Secondary | ICD-10-CM | POA: Diagnosis not present

## 2014-11-13 DIAGNOSIS — Y92008 Other place in unspecified non-institutional (private) residence as the place of occurrence of the external cause: Secondary | ICD-10-CM | POA: Diagnosis not present

## 2014-11-13 DIAGNOSIS — M6281 Muscle weakness (generalized): Secondary | ICD-10-CM | POA: Diagnosis not present

## 2014-11-13 DIAGNOSIS — F329 Major depressive disorder, single episode, unspecified: Secondary | ICD-10-CM | POA: Diagnosis present

## 2014-11-13 DIAGNOSIS — Z23 Encounter for immunization: Secondary | ICD-10-CM

## 2014-11-13 DIAGNOSIS — Z86718 Personal history of other venous thrombosis and embolism: Secondary | ICD-10-CM

## 2014-11-13 DIAGNOSIS — I10 Essential (primary) hypertension: Secondary | ICD-10-CM | POA: Diagnosis not present

## 2014-11-13 DIAGNOSIS — E875 Hyperkalemia: Secondary | ICD-10-CM | POA: Diagnosis present

## 2014-11-13 DIAGNOSIS — Z833 Family history of diabetes mellitus: Secondary | ICD-10-CM

## 2014-11-13 DIAGNOSIS — Z96642 Presence of left artificial hip joint: Secondary | ICD-10-CM | POA: Diagnosis present

## 2014-11-13 DIAGNOSIS — K219 Gastro-esophageal reflux disease without esophagitis: Secondary | ICD-10-CM | POA: Diagnosis not present

## 2014-11-13 DIAGNOSIS — T148 Other injury of unspecified body region: Secondary | ICD-10-CM | POA: Diagnosis not present

## 2014-11-13 DIAGNOSIS — E271 Primary adrenocortical insufficiency: Secondary | ICD-10-CM | POA: Diagnosis not present

## 2014-11-13 DIAGNOSIS — S32591A Other specified fracture of right pubis, initial encounter for closed fracture: Principal | ICD-10-CM | POA: Diagnosis present

## 2014-11-13 DIAGNOSIS — W010XXA Fall on same level from slipping, tripping and stumbling without subsequent striking against object, initial encounter: Secondary | ICD-10-CM | POA: Diagnosis present

## 2014-11-13 DIAGNOSIS — M1990 Unspecified osteoarthritis, unspecified site: Secondary | ICD-10-CM | POA: Diagnosis not present

## 2014-11-13 DIAGNOSIS — E785 Hyperlipidemia, unspecified: Secondary | ICD-10-CM | POA: Diagnosis present

## 2014-11-13 DIAGNOSIS — W19XXXA Unspecified fall, initial encounter: Secondary | ICD-10-CM | POA: Diagnosis not present

## 2014-11-13 DIAGNOSIS — M545 Low back pain: Secondary | ICD-10-CM | POA: Diagnosis present

## 2014-11-13 DIAGNOSIS — S329XXA Fracture of unspecified parts of lumbosacral spine and pelvis, initial encounter for closed fracture: Secondary | ICD-10-CM | POA: Diagnosis not present

## 2014-11-13 DIAGNOSIS — Z961 Presence of intraocular lens: Secondary | ICD-10-CM | POA: Diagnosis present

## 2014-11-13 DIAGNOSIS — Z5189 Encounter for other specified aftercare: Secondary | ICD-10-CM | POA: Diagnosis not present

## 2014-11-13 DIAGNOSIS — S32501A Unspecified fracture of right pubis, initial encounter for closed fracture: Secondary | ICD-10-CM | POA: Diagnosis not present

## 2014-11-13 DIAGNOSIS — Z801 Family history of malignant neoplasm of trachea, bronchus and lung: Secondary | ICD-10-CM | POA: Diagnosis not present

## 2014-11-13 DIAGNOSIS — M25559 Pain in unspecified hip: Secondary | ICD-10-CM | POA: Diagnosis not present

## 2014-11-13 DIAGNOSIS — E1165 Type 2 diabetes mellitus with hyperglycemia: Secondary | ICD-10-CM | POA: Diagnosis not present

## 2014-11-13 DIAGNOSIS — F3176 Bipolar disorder, in full remission, most recent episode depressed: Secondary | ICD-10-CM | POA: Diagnosis not present

## 2014-11-13 DIAGNOSIS — D72829 Elevated white blood cell count, unspecified: Secondary | ICD-10-CM | POA: Diagnosis present

## 2014-11-13 DIAGNOSIS — I80299 Phlebitis and thrombophlebitis of other deep vessels of unspecified lower extremity: Secondary | ICD-10-CM | POA: Diagnosis not present

## 2014-11-13 DIAGNOSIS — G8929 Other chronic pain: Secondary | ICD-10-CM | POA: Diagnosis present

## 2014-11-13 DIAGNOSIS — E1140 Type 2 diabetes mellitus with diabetic neuropathy, unspecified: Secondary | ICD-10-CM | POA: Diagnosis not present

## 2014-11-13 DIAGNOSIS — Z8249 Family history of ischemic heart disease and other diseases of the circulatory system: Secondary | ICD-10-CM | POA: Diagnosis not present

## 2014-11-13 DIAGNOSIS — F419 Anxiety disorder, unspecified: Secondary | ICD-10-CM | POA: Diagnosis not present

## 2014-11-13 LAB — CBC WITH DIFFERENTIAL/PLATELET
Basophils Absolute: 0.1 10*3/uL (ref 0–0.1)
Basophils Relative: 1 %
EOS PCT: 0 %
Eosinophils Absolute: 0.1 10*3/uL (ref 0–0.7)
HCT: 41.6 % (ref 35.0–47.0)
HEMATOCRIT: UNDETERMINED % (ref 36.0–46.0)
HEMOGLOBIN: UNDETERMINED g/dL (ref 12.0–15.0)
Hemoglobin: 13.2 g/dL (ref 12.0–16.0)
LYMPHS ABS: 1.2 10*3/uL (ref 1.0–3.6)
LYMPHS PCT: 6 %
MCH: UNDETERMINED pg (ref 26.0–34.0)
MCH: 25.6 pg — AB (ref 26.0–34.0)
MCHC: 31.6 g/dL — ABNORMAL LOW (ref 32.0–36.0)
MCHC: UNDETERMINED g/dL (ref 30.0–36.0)
MCV: 81.1 fL (ref 80.0–100.0)
MCV: UNDETERMINED fL (ref 78.0–100.0)
MONO ABS: 1 10*3/uL — AB (ref 0.2–0.9)
Monocytes Relative: 5 %
Neutro Abs: 18.1 10*3/uL — ABNORMAL HIGH (ref 1.4–6.5)
Neutrophils Relative %: 88 %
PLATELETS: 339 10*3/uL (ref 150–440)
Platelets: UNDETERMINED 10*3/uL (ref 150–400)
RBC: UNDETERMINED MIL/uL (ref 3.87–5.11)
RBC: 5.13 MIL/uL (ref 3.80–5.20)
RDW: 18.8 % — AB (ref 11.5–14.5)
RDW: UNDETERMINED % (ref 11.5–15.5)
WBC: UNDETERMINED 10*3/uL (ref 4.0–10.5)
WBC: 20.4 10*3/uL — ABNORMAL HIGH (ref 3.6–11.0)

## 2014-11-13 LAB — COMPREHENSIVE METABOLIC PANEL
ALK PHOS: 90 U/L (ref 38–126)
ALT: 17 U/L (ref 14–54)
AST: 29 U/L (ref 15–41)
Albumin: 3.7 g/dL (ref 3.5–5.0)
Anion gap: 12 (ref 5–15)
BUN: 26 mg/dL — ABNORMAL HIGH (ref 6–20)
CALCIUM: 9.1 mg/dL (ref 8.9–10.3)
CO2: 24 mmol/L (ref 22–32)
CREATININE: 1.23 mg/dL — AB (ref 0.44–1.00)
Chloride: 99 mmol/L — ABNORMAL LOW (ref 101–111)
GFR calc non Af Amer: 43 mL/min — ABNORMAL LOW (ref >60)
GFR, EST AFRICAN AMERICAN: 50 mL/min — AB (ref >60)
GLUCOSE: 239 mg/dL — AB (ref 65–99)
Potassium: 5 mmol/L (ref 3.5–5.1)
SODIUM: 135 mmol/L (ref 135–145)
Total Bilirubin: 0.8 mg/dL (ref 0.3–1.2)
Total Protein: 7.1 g/dL (ref 6.5–8.1)

## 2014-11-13 LAB — GLUCOSE, CAPILLARY
GLUCOSE-CAPILLARY: 217 mg/dL — AB (ref 65–99)
GLUCOSE-CAPILLARY: 167 mg/dL — AB (ref 65–99)

## 2014-11-13 LAB — PROTIME-INR
INR: 0.92
Prothrombin Time: 12.6 seconds (ref 11.4–15.0)

## 2014-11-13 MED ORDER — LISINOPRIL 20 MG PO TABS
20.0000 mg | ORAL_TABLET | Freq: Two times a day (BID) | ORAL | Status: DC
  Administered 2014-11-14: 20 mg via ORAL
  Filled 2014-11-13: qty 1

## 2014-11-13 MED ORDER — ENOXAPARIN SODIUM 40 MG/0.4ML ~~LOC~~ SOLN
40.0000 mg | SUBCUTANEOUS | Status: DC
  Administered 2014-11-13 – 2014-11-15 (×3): 40 mg via SUBCUTANEOUS
  Filled 2014-11-13 (×3): qty 0.4

## 2014-11-13 MED ORDER — SODIUM CHLORIDE 0.9 % IV BOLUS (SEPSIS)
500.0000 mL | Freq: Once | INTRAVENOUS | Status: AC
  Administered 2014-11-13: 500 mL via INTRAVENOUS

## 2014-11-13 MED ORDER — ONDANSETRON HCL 4 MG/2ML IJ SOLN
4.0000 mg | Freq: Once | INTRAMUSCULAR | Status: DC
  Filled 2014-11-13: qty 2

## 2014-11-13 MED ORDER — HYDROCORTISONE 10 MG PO TABS: 10.0000 mg | ORAL_TABLET | Freq: Every day | ORAL | Status: DC

## 2014-11-13 MED ORDER — INSULIN ASPART 100 UNIT/ML ~~LOC~~ SOLN
0.0000 [IU] | Freq: Three times a day (TID) | SUBCUTANEOUS | Status: DC
  Administered 2014-11-13 – 2014-11-14 (×2): 3 [IU] via SUBCUTANEOUS
  Administered 2014-11-14 – 2014-11-15 (×3): 2 [IU] via SUBCUTANEOUS
  Administered 2014-11-15: 3 [IU] via SUBCUTANEOUS
  Administered 2014-11-15 – 2014-11-16 (×3): 2 [IU] via SUBCUTANEOUS
  Filled 2014-11-13 (×4): qty 2
  Filled 2014-11-13 (×3): qty 3
  Filled 2014-11-13 (×2): qty 2

## 2014-11-13 MED ORDER — PNEUMOCOCCAL VAC POLYVALENT 25 MCG/0.5ML IJ INJ
0.5000 mL | INJECTION | INTRAMUSCULAR | Status: AC
  Administered 2014-11-14: 0.5 mL via INTRAMUSCULAR
  Filled 2014-11-13: qty 0.5

## 2014-11-13 MED ORDER — HYDROCORTISONE 10 MG PO TABS
20.0000 mg | ORAL_TABLET | Freq: Every morning | ORAL | Status: DC
  Administered 2014-11-14 – 2014-11-16 (×3): 20 mg via ORAL
  Filled 2014-11-13 (×4): qty 2

## 2014-11-13 MED ORDER — ACETAMINOPHEN 650 MG RE SUPP: 650.0000 mg | Freq: Four times a day (QID) | RECTAL | Status: DC | PRN

## 2014-11-13 MED ORDER — HYDROMORPHONE HCL 1 MG/ML IJ SOLN
1.0000 mg | Freq: Once | INTRAMUSCULAR | Status: DC
  Filled 2014-11-13: qty 1

## 2014-11-13 MED ORDER — SENNOSIDES-DOCUSATE SODIUM 8.6-50 MG PO TABS: 1.0000 | ORAL_TABLET | Freq: Every evening | ORAL | Status: DC | PRN

## 2014-11-13 MED ORDER — FENOFIBRATE 160 MG PO TABS
160.0000 mg | ORAL_TABLET | Freq: Every day | ORAL | Status: DC
  Administered 2014-11-13 – 2014-11-16 (×4): 160 mg via ORAL
  Filled 2014-11-13 (×4): qty 1

## 2014-11-13 MED ORDER — ACETAMINOPHEN 325 MG PO TABS: 650.0000 mg | ORAL_TABLET | Freq: Four times a day (QID) | ORAL | Status: DC | PRN

## 2014-11-13 MED ORDER — POLYETHYL GLYCOL-PROPYL GLYCOL 0.4-0.3 % OP GEL
Freq: Every day | OPHTHALMIC | Status: DC | PRN
  Filled 2014-11-13: qty 10

## 2014-11-13 MED ORDER — INSULIN ASPART 100 UNIT/ML ~~LOC~~ SOLN
0.0000 [IU] | Freq: Every day | SUBCUTANEOUS | Status: DC
  Administered 2014-11-16: 2 [IU] via SUBCUTANEOUS

## 2014-11-13 MED ORDER — MORPHINE SULFATE (PF) 2 MG/ML IV SOLN
2.0000 mg | INTRAVENOUS | Status: DC | PRN
  Administered 2014-11-13 – 2014-11-14 (×5): 2 mg via INTRAVENOUS
  Filled 2014-11-13 (×5): qty 1

## 2014-11-13 MED ORDER — PANTOPRAZOLE SODIUM 40 MG PO TBEC
40.0000 mg | DELAYED_RELEASE_TABLET | Freq: Every day | ORAL | Status: DC
  Administered 2014-11-14 – 2014-11-16 (×3): 40 mg via ORAL
  Filled 2014-11-13 (×3): qty 1

## 2014-11-13 MED ORDER — ALPRAZOLAM 0.5 MG PO TABS
0.5000 mg | ORAL_TABLET | Freq: Two times a day (BID) | ORAL | Status: DC | PRN
  Administered 2014-11-13 – 2014-11-16 (×4): 0.5 mg via ORAL
  Filled 2014-11-13 (×4): qty 1

## 2014-11-13 MED ORDER — SODIUM CHLORIDE 0.9 % IV SOLN
INTRAVENOUS | Status: DC
  Administered 2014-11-13 – 2014-11-14 (×3): via INTRAVENOUS

## 2014-11-13 MED ORDER — DULOXETINE HCL 60 MG PO CPEP
60.0000 mg | ORAL_CAPSULE | Freq: Every day | ORAL | Status: DC
  Administered 2014-11-14 – 2014-11-16 (×3): 60 mg via ORAL
  Filled 2014-11-13 (×3): qty 1

## 2014-11-13 MED ORDER — ONDANSETRON 4 MG PO TBDP
4.0000 mg | ORAL_TABLET | Freq: Once | ORAL | Status: AC
  Administered 2014-11-13: 4 mg via ORAL
  Filled 2014-11-13: qty 1

## 2014-11-13 MED ORDER — FLUDROCORTISONE ACETATE 0.1 MG PO TABS
0.1000 mg | ORAL_TABLET | Freq: Every day | ORAL | Status: DC
Start: 2014-11-14 — End: 2014-11-16
  Administered 2014-11-14 – 2014-11-16 (×3): 0.1 mg via ORAL
  Filled 2014-11-13 (×3): qty 1

## 2014-11-13 MED ORDER — HYDROCODONE-ACETAMINOPHEN 5-325 MG PO TABS
1.0000 | ORAL_TABLET | ORAL | Status: DC | PRN
  Administered 2014-11-13: 1 via ORAL
  Administered 2014-11-14 – 2014-11-15 (×6): 2 via ORAL
  Administered 2014-11-16 (×2): 1 via ORAL
  Filled 2014-11-13 (×3): qty 2
  Filled 2014-11-13: qty 1
  Filled 2014-11-13: qty 2
  Filled 2014-11-13: qty 1
  Filled 2014-11-13 (×3): qty 2
  Filled 2014-11-13: qty 1

## 2014-11-13 MED ORDER — HYDROCORTISONE 10 MG PO TABS
10.0000 mg | ORAL_TABLET | Freq: Every day | ORAL | Status: DC
  Administered 2014-11-14 – 2014-11-15 (×3): 10 mg via ORAL
  Filled 2014-11-13 (×2): qty 1

## 2014-11-13 MED ORDER — DOCUSATE SODIUM 100 MG PO CAPS
100.0000 mg | ORAL_CAPSULE | Freq: Two times a day (BID) | ORAL | Status: DC
  Administered 2014-11-13 – 2014-11-16 (×6): 100 mg via ORAL
  Filled 2014-11-13 (×6): qty 1

## 2014-11-13 MED ORDER — HYDROMORPHONE HCL 1 MG/ML IJ SOLN
1.0000 mg | Freq: Once | INTRAMUSCULAR | Status: AC
  Administered 2014-11-13: 1 mg via INTRAMUSCULAR

## 2014-11-13 NOTE — ED Notes (Signed)
Pt spoke with friend to let her know she was being admitted. Per patient her friend will call her son to let him know as well.

## 2014-11-13 NOTE — ED Notes (Signed)
Multiple attempts by myself and an ED Medic to place IV.  Dr. Marcelene Butte notified.  Will change medications to IM or PO and was able to send blood for CMP and CBC. Unable to obtain enough blood at this time.  Another RN will try for IV access.

## 2014-11-13 NOTE — H&P (Signed)
[facility] NAME: Jordan Brewer    MR#:  [id number]  DATE OF BIRTH:  August 14, 1940  DATE OF ADMISSION:  11/13/2014  PRIMARY CARE PHYSICIAN: Tracie Harrier, MD   REQUESTING/REFERRING PHYSICIAN: Dr. Marcelene Butte  CHIEF COMPLAINT:  Fall  HISTORY OF PRESENT ILLNESS:  Jordan Brewer  is a 74 y.o. female with a known history of hypertension, hyperlipidemia, left lower extremity DVT after hip replacement, currently not on any blood thinners, Addison's disease and type 2 diabetes mellitus is brought into the ED after she sustained a non-syncopal fall landing primarily on her right side.Patient states she was ambulating on some concrete block type driveway and lost her balance and fell landing primarily in a grassy region. She denies any head trauma that she had difficulty getting up to ambulate. Patient lives alone in the ED x-ray has revealed right pubic symphysis fracture. patient is in tears from pain in the right groin area.  PAST MEDICAL HISTORY:   Past Medical History  Diagnosis Date  . Hypertension   . Hypercholesteremia   . DVT, lower extremity 09/2010    "left; after hip replacement" (04/10/2012)  . Addison's disease   . Type II diabetes mellitus   . History of blood transfusion   . GERD (gastroesophageal reflux disease)   . Arthritis     "all over my body" (04/10/2012)  . Chronic back pain     "since my hip OR in 09/2010"  . Fall at home 02/2012    'foot got caught on heating pad cord; broke left ankle" (04/10/2012)  . Ankle fracture, left 02/2012  . Syncope and collapse 04/10/2012    "w/loss of consciousness" (04/10/2012)  . Depression   . Bipolar depression     PAST SURGICAL HISTOIRY:   Past Surgical History  Procedure Laterality Date  . Tonsillectomy  ~ 1949  . Appendectomy  ~ 1949  . Vaginal hysterectomy  1970's?  . Augmentation mammaplasty Bilateral 1970  . Colostomy  ~ 2010  . Colostomy reversal  ~ 2010    "wore a bag  for ~ 6 months" (04/10/2012)  . Dilation and curettage of uterus  ~ 1964    "after a miscarriage" (04/10/2012)  . Cataract extraction w/ intraocular lens  implant, bilateral Bilateral ~ 2010  . Joint replacement    . Total hip arthroplasty Left 09/2010  . Nasal hemorrhage control  ~ 2009    "had to have blood transfusion" (04/10/2012)    SOCIAL HISTORY:   Social History  Substance Use Topics  . Smoking status: Never Smoker   . Smokeless tobacco: Never Used  . Alcohol Use: No    FAMILY HISTORY:   Family History  Problem Relation Age of Onset  . Hypertension Mother   . Diabetes Mellitus II Mother   . Heart attack Mother   . Lung cancer Father     DRUG ALLERGIES:   Allergies  Allergen Reactions  . Codeine Nausea And Vomiting  . 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 Promethazine Nausea And Vomiting  . Lidoderm [Lidocaine] Rash    REVIEW OF SYSTEMS:  CONSTITUTIONAL: No fever, fatigue or weakness.  EYES: No blurred or double vision.  EARS, NOSE, AND THROAT: No tinnitus or ear pain.  RESPIRATORY: No cough, shortness of breath, wheezing or hemoptysis.  CARDIOVASCULAR: No chest pain, orthopnea, edema.  GASTROINTESTINAL: No nausea, vomiting, diarrhea or abdominal pain.  GENITOURINARY: No dysuria, hematuria.  ENDOCRINE: No polyuria, nocturia,  HEMATOLOGY: No anemia, easy bruising or bleeding SKIN: No rash or lesion. MUSCULOSKELETAL: Reporting right groin pain. No joint pain . Has chronic history of osteoarthritis NEUROLOGIC: No tingling, numbness, weakness.  PSYCHIATRY: No anxiety or depression.   MEDICATIONS AT HOME:   Prior to Admission medications   Medication Sig Start Date End Date Taking? Authorizing Provider  ALPRAZolam Duanne Moron) 0.5 MG tablet Take 0.5 mg by mouth 2 (two) times daily as needed for sleep. For anxiety    Historical Provider, MD  DULoxetine (CYMBALTA) 60 MG capsule Take 60 mg by mouth daily.    Historical Provider, MD   fenofibrate 160 MG tablet Take 160 mg by mouth daily.    Historical Provider, MD  fludrocortisone (FLORINEF) 0.1 MG tablet Take 0.1 mg by mouth daily.    Historical Provider, MD  HYDROcodone-acetaminophen (NORCO) 7.5-325 MG per tablet Take 1 tablet by mouth 3 (three) times daily as needed for pain. For hip pain    Historical Provider, MD  hydrocortisone (CORTEF) 10 MG tablet Take 10-20 mg by mouth daily.    Historical Provider, MD  lisinopril (PRINIVIL,ZESTRIL) 20 MG tablet Take 20 mg by mouth 2 (two) times daily.    Historical Provider, MD  omeprazole (PRILOSEC) 40 MG capsule Take 40 mg by mouth 2 (two) times daily.    Historical Provider, MD  Polyethyl Glycol-Propyl Glycol (SYSTANE OP) Apply 1 drop to eye daily as needed. For burning/irritated eyes    Historical Provider, MD      VITAL SIGNS:  Blood pressure 136/81, pulse 114, temperature 98.5 F (36.9 C), temperature source Oral, resp. rate 21, height 5\' 4"  (1.626 m), weight 79.379 kg (175 lb), SpO2 93 %.  PHYSICAL EXAMINATION:  GENERAL:  74 y.o.-year-old patient lying in the bed with no acute distress but uncomfortable from right groin pain EYES: Pupils equal, round, reactive to light and accommodation. No scleral icterus. Extraocular muscles intact.  HEENT: Head atraumatic, normocephalic. Oropharynx and nasopharynx clear.  NECK:  Supple, no jugular venous distention. No thyroid enlargement, no tenderness.  LUNGS: Normal breath sounds bilaterally, no wheezing, rales,rhonchi or crepitation. No use of accessory muscles of respiration.  CARDIOVASCULAR: S1, S2 normal. No murmurs, rubs, or gallops.  ABDOMEN: Soft, nontender, nondistended. Bowel sounds present. No organomegaly or mass.  EXTREMITIES: Right groin area is tender to touch, right hip is externally rotated No pedal edema, cyanosis, or clubbing.  NEUROLOGIC: Cranial nerves II through XII are intact. Muscle strength 5/5 in all extremities except right lower extremity. Sensation  intact. Gait not checked.  PSYCHIATRIC: The patient is alert and oriented x 3.  SKIN: No obvious rash, lesion, or ulcer.   LABORATORY PANEL:   CBC  Recent Labs Lab 11/13/14 1457  WBC 20.4*  HGB 13.2  HCT 41.6  PLT 339   ------------------------------------------------------------------------------------------------------------------  Chemistries   Recent Labs Lab 11/13/14 1433  NA 135  K 5.0  CL 99*  CO2 24  GLUCOSE 239*  BUN 26*  CREATININE 1.23*  CALCIUM 9.1  AST 29  ALT 17  ALKPHOS 90  BILITOT 0.8   ------------------------------------------------------------------------------------------------------------------  Cardiac Enzymes No results for input(s): TROPONINI in the last 168 hours. ------------------------------------------------------------------------------------------------------------------  RADIOLOGY:  Dg Hip Unilat With Pelvis 2-3 Views Right  11/13/2014  CLINICAL DATA:  74 year old female with a history of fall. EXAM: DG HIP (WITH OR WITHOUT PELVIS) 2-3V RIGHT COMPARISON:  02/20/2011 FINDINGS: Diffuse osteopenia, limits sensitivity for the detection of nondisplaced fractures. Irregularity at the right aspect of  the pubic symphysis, new from plain film 02/20/2011. Surgical changes of left total hip arthroplasty. Right hip projects normally over the acetabulum. Unremarkable appearance of proximal right femur. Surgical changes of the anatomic pelvis. Degenerative changes of the lumbar spine. Atherosclerosis IMPRESSION: Fracture of the right pubic symphysis, new from the plain film 02/20/2011, though appears to have some chronicity/ callus formation. Unremarkable appearance right hip. Osteopenia. Surgical changes of left total hip arthroplasty. Signed, Dulcy Fanny. Earleen Newport, DO [facility] Electronically Signed   By: Corrie Mckusick D.O.   On: 11/13/2014 13:57    EKG:   Orders placed or performed during the  hospital encounter of 11/13/14  . ED EKG  . ED EKG  . EKG 12-Lead  . EKG 12-Lead    IMPRESSION AND PLAN:   1.Non syncopal fall - Will check orthostatics PT evaluation  2. Right pubic symphysis fracture from problem #1 Pain management with Percocet and morphine as needed basis Consult is placed for PT evaluation  3. Chronic hypertension Continuous lisinopril which is her home medication and monitor blood pressure closely.   4. Non-insulin-dependent diabetes mellitus Patient will be on sliding scale insulin with Accu-Cheks  5. History of Addison's disease Continue home medication fludrocortisone  Disposition based on PT evaluation. Consult is placed to case management regarding discharge needs    All the records are reviewed and case discussed with ED provider. Management plans discussed with the patient, family and they are in agreement.  CODE STATUS: Full code, son Aldine Contes is the healthcare power of attorney Greater than 50% time was spent on coordination of care and face-to-face counseling  TOTAL TIME TAKING CARE OF THIS PATIENT: 45 minutes.    Nicholes Mango M.D on 11/13/2014 at 4:26 PM  Between 7am to 6pm - Pager - [phone]  After 6pm go to [email or web address] - password [email or web address] [facility]  Office  [phone]  CC: Primary care physician; Tracie Harrier, MD

## 2014-11-13 NOTE — ED Provider Notes (Signed)
Time Seen: Approximately ----------------------------------------- 3:59 PM on 11/13/2014 -----------------------------------------   I have reviewed the triage notes  Chief Complaint: Fall   History of Present Illness: Jordan Brewer is a 74 y.o. female who presents after a non-syncopal fall landing primarily on her right side. Patient states she was ambulating on some concrete block type driveway and lost her balance and fell landing primarily in a grassy region. She denies any head trauma that she had difficulty getting up to ambulate. She describes pain in her right hip but also primarily in the right groin area.   Past Medical History  Diagnosis Date  . Hypertension   . Hypercholesteremia   . DVT, lower extremity 09/2010    "left; after hip replacement" (04/10/2012)  . Addison's disease   . Type II diabetes mellitus   . History of blood transfusion   . GERD (gastroesophageal reflux disease)   . Arthritis     "all over my body" (04/10/2012)  . Chronic back pain     "since my hip OR in 09/2010"  . Fall at home 02/2012    'foot got caught on heating pad cord; broke left ankle" (04/10/2012)  . Ankle fracture, left 02/2012  . Syncope and collapse 04/10/2012    "w/loss of consciousness" (04/10/2012)  . Depression   . Bipolar depression     Patient Active Problem List   Diagnosis Date Noted  . Syncope and collapse 04/10/2012  . Type II or unspecified type diabetes mellitus without mention of complication, not stated as uncontrolled 04/10/2012  . Unspecified essential hypertension 04/10/2012  . Bipolar I disorder, most recent episode (or current) unspecified 04/10/2012  . Addison's disease ([place]) 04/10/2012  . Hypoglycemia 04/10/2012  . Chronic kidney disease 04/10/2012  . SAH (subarachnoid hemorrhage) ([place]) 04/10/2012    Past Surgical History  Procedure Laterality Date  . Tonsillectomy  ~ 1949  . Appendectomy  ~ 1949  . Vaginal hysterectomy  1970's?  . Augmentation  mammaplasty Bilateral 1970  . Colostomy  ~ 2010  . Colostomy reversal  ~ 2010    "wore a bag for ~ 6 months" (04/10/2012)  . Dilation and curettage of uterus  ~ 1964    "after a miscarriage" (04/10/2012)  . Cataract extraction w/ intraocular lens  implant, bilateral Bilateral ~ 2010  . Joint replacement    . Total hip arthroplasty Left 09/2010  . Nasal hemorrhage control  ~ 2009    "had to have blood transfusion" (04/10/2012)    Past Surgical History  Procedure Laterality Date  . Tonsillectomy  ~ 1949  . Appendectomy  ~ 1949  . Vaginal hysterectomy  1970's?  . Augmentation mammaplasty Bilateral 1970  . Colostomy  ~ 2010  . Colostomy reversal  ~ 2010    "wore a bag for ~ 6 months" (04/10/2012)  . Dilation and curettage of uterus  ~ 1964    "after a miscarriage" (04/10/2012)  . Cataract extraction w/ intraocular lens  implant, bilateral Bilateral ~ 2010  . Joint replacement    . Total hip arthroplasty Left 09/2010  . Nasal hemorrhage control  ~ 2009    "had to have blood transfusion" (04/10/2012)    Current Outpatient Rx  Name  Route  Sig  Dispense  Refill  . ALPRAZolam (XANAX) 0.5 MG tablet   Oral   Take 0.5 mg by mouth 2 (two) times daily as needed for sleep. For anxiety         . DULoxetine (CYMBALTA) 60 MG  capsule   Oral   Take 60 mg by mouth daily.         . fenofibrate 160 MG tablet   Oral   Take 160 mg by mouth daily.         . fludrocortisone (FLORINEF) 0.1 MG tablet   Oral   Take 0.1 mg by mouth daily.         Marland Kitchen HYDROcodone-acetaminophen (NORCO) 7.5-325 MG per tablet   Oral   Take 1 tablet by mouth 3 (three) times daily as needed for pain. For hip pain         . hydrocortisone (CORTEF) 10 MG tablet   Oral   Take 10-20 mg by mouth daily.         Marland Kitchen lisinopril (PRINIVIL,ZESTRIL) 20 MG tablet   Oral   Take 20 mg by mouth 2 (two) times daily.         Marland Kitchen omeprazole (PRILOSEC) 40 MG capsule   Oral   Take 40 mg by mouth 2 (two) times daily.         Vladimir Faster Glycol-Propyl Glycol (SYSTANE OP)   Ophthalmic   Apply 1 drop to eye daily as needed. For burning/irritated eyes           Allergies:  Prednisone; Promethazine; and Lidoderm  Family History: Family History  Problem Relation Age of Onset  . Hypertension Mother   . Diabetes Mellitus II Mother   . Heart attack Mother   . Lung cancer Father     Social History: Social History  Substance Use Topics  . Smoking status: Never Smoker   . Smokeless tobacco: Never Used  . Alcohol Use: No     Review of Systems:   10 point review of systems was performed and was otherwise negative:  Constitutional: No fever Eyes: No visual disturbances ENT: No sore throat, ear pain Cardiac: No chest pain Respiratory: No shortness of breath, wheezing, or stridor Abdomen: No abdominal pain, no vomiting, No diarrhea Endocrine: No weight loss, No night sweats Extremities:  she states a mild contusion to the right upper extremity but denies any significant pain or other inability to move her right arm.i  Skin : No rashes, easy bruising Neurologic: No focal weakness, trouble with speech or swollowing Urologic: No dysuria, Hematuria, or urinary frequency   Physical Exam:  ED Triage Vitals  Enc Vitals Group     BP 11/13/14 1233 116/65 mmHg     Pulse Rate 11/13/14 1233 111     Resp 11/13/14 1233 16     Temp 11/13/14 1233 98.5 F (36.9 C)     Temp Source 11/13/14 1233 Oral     SpO2 11/13/14 1233 100 %     Weight 11/13/14 1233 175 lb (79.379 kg)     Height 11/13/14 1233 5\' 4"  (1.626 m)     Head Cir --      Peak Flow --      Pain Score 11/13/14 1331 10     Pain Loc --      Pain Edu? --      Excl. in [place]? --     General: Awake , Alert , and Oriented times 3; GCS 15 Head: Normal cephalic , atraumatic Eyes: Pupils equal , round, reactive to light Nose/Throat: No nasal drainage, patent upper airway without erythema or exudate.  Neck: Supple, Full range of motion, No anterior  adenopathy or palpable thyroid masses Lungs: Clear to ascultation without wheezes , rhonchi, or rales  Heart: Regular rate, regular rhythm without murmurs , gallops , or rubs Abdomen: Soft, non tender without rebound, guarding , or rigidity; bowel sounds positive and symmetric in all 4 quadrants. No organomegaly .        Extremities: examination of the extremities shows no shortening or rotation. All extremities are neurovascularly intact. Close examination right hip shows tenderness toward her right groin with no crepitus or step-off noted.  Neurologic , Motor symmetric without deficits, sensory intact Skin: warm, dry, no rashes   Labs:   All laboratory work was reviewed including any pertinent negatives or positives listed below:  Labs Reviewed  COMPREHENSIVE METABOLIC PANEL - Abnormal; Notable for the following:    Chloride 99 (*)    Glucose, Bld 239 (*)    BUN 26 (*)    Creatinine, Ser 1.23 (*)    GFR calc non Af Amer 43 (*)    GFR calc Af Amer 50 (*)    All other components within normal limits  CBC WITH DIFFERENTIAL/PLATELET - Abnormal; Notable for the following:    WBC 20.4 (*)    MCH 25.6 (*)    MCHC 31.6 (*)    RDW 18.8 (*)    Neutro Abs 18.1 (*)    Monocytes Absolute 1.0 (*)    All other components within normal limits  CBC WITH DIFFERENTIAL/PLATELET  PROTIME-INR    EKG:  ED ECG REPORT I, Daymon Larsen, the attending physician, personally viewed and interpreted this ECG.   Date: 11/13/2014  EKG Time: 1455  Rate: 110  Rhythm: Normal sinus rhythm poor quality Axis: Normal  Intervals normal ST&T Change:  Old anterior infarct; old inferior infarct no acute ischemic changes noted   EXAM: DG HIP (WITH OR WITHOUT PELVIS) 2-3V RIGHT  COMPARISON: 02/20/2011  FINDINGS: Diffuse osteopenia, limits sensitivity for the detection of nondisplaced fractures.  Irregularity at the right aspect of the pubic symphysis, new from plain film 02/20/2011.  Surgical changes  of left total hip arthroplasty.  Right hip projects normally over the acetabulum.  Unremarkable appearance of proximal right femur.  Surgical changes of the anatomic pelvis.  Degenerative changes of the lumbar spine.  Atherosclerosis  IMPRESSION: Fracture of the right pubic symphysis, new from the plain film 02/20/2011, though appears to have some chronicity/ callus formation.  Unremarkable appearance right hip.  Osteopenia.  Surgical changes of left total hip arthroplasty.  Signed,  Dulcy Fanny. Earleen Newport, DO  [facility]  [facility]   Electronically Signed By: Corrie Mckusick D.O. On: 11/13/2014 13:57  Radiology:   I personally reviewed the radiologic studies    ED Course:  Patient's stay was uneventful and she was given Dilaudid IM as she had difficulty getting IV access here in emergency department. Patient otherwise is hemodynamically stable. She appears to have an acute pelvis fracture based on x-ray and clinical exam.    Assessment Right-sided pelvic fracture   Final Clinical Impression:   Final diagnoses:  Closed fracture of pelvis, unspecified part of pelvis, initial encounter [facility])     Plan:  Inpatient management I spoke to the hospitalist team, further disposition and management depends upon their evaluation likely admission for pain control and physical therapy. Patient normally lives independently at home.            Daymon Larsen, MD 11/13/14 [id number]

## 2014-11-13 NOTE — ED Notes (Signed)
Pt was walking from her mailbox and fell on her walkway, pt is c/o rt hip, rt groin, rt leg, and rt breast pain

## 2014-11-13 NOTE — ED Notes (Signed)
Unit notified that patient is on her way.

## 2014-11-14 DIAGNOSIS — M1990 Unspecified osteoarthritis, unspecified site: Secondary | ICD-10-CM | POA: Diagnosis not present

## 2014-11-14 DIAGNOSIS — I10 Essential (primary) hypertension: Secondary | ICD-10-CM | POA: Diagnosis not present

## 2014-11-14 DIAGNOSIS — E875 Hyperkalemia: Secondary | ICD-10-CM | POA: Diagnosis not present

## 2014-11-14 DIAGNOSIS — E785 Hyperlipidemia, unspecified: Secondary | ICD-10-CM | POA: Diagnosis not present

## 2014-11-14 DIAGNOSIS — E1165 Type 2 diabetes mellitus with hyperglycemia: Secondary | ICD-10-CM | POA: Diagnosis not present

## 2014-11-14 DIAGNOSIS — D72829 Elevated white blood cell count, unspecified: Secondary | ICD-10-CM | POA: Diagnosis not present

## 2014-11-14 DIAGNOSIS — E271 Primary adrenocortical insufficiency: Secondary | ICD-10-CM | POA: Diagnosis not present

## 2014-11-14 DIAGNOSIS — K219 Gastro-esophageal reflux disease without esophagitis: Secondary | ICD-10-CM | POA: Diagnosis not present

## 2014-11-14 DIAGNOSIS — S32591A Other specified fracture of right pubis, initial encounter for closed fracture: Secondary | ICD-10-CM | POA: Diagnosis not present

## 2014-11-14 LAB — GLUCOSE, CAPILLARY
GLUCOSE-CAPILLARY: 154 mg/dL — AB (ref 65–99)
GLUCOSE-CAPILLARY: 229 mg/dL — AB (ref 65–99)
Glucose-Capillary: 173 mg/dL — ABNORMAL HIGH (ref 65–99)
Glucose-Capillary: 168 mg/dL — ABNORMAL HIGH (ref 65–99)

## 2014-11-14 LAB — URINALYSIS COMPLETE WITH MICROSCOPIC (ARMC ONLY)
BILIRUBIN URINE: NEGATIVE
Glucose, UA: >500 mg/dL — AB
Hgb urine dipstick: NEGATIVE
KETONES UR: NEGATIVE mg/dL
LEUKOCYTES UA: NEGATIVE
NITRITE: NEGATIVE
PH: 6 (ref 5.0–8.0)
Protein, ur: NEGATIVE mg/dL
Specific Gravity, Urine: 1.016 (ref 1.005–1.030)

## 2014-11-14 LAB — BASIC METABOLIC PANEL
Anion gap: 7 (ref 5–15)
BUN: 25 mg/dL — ABNORMAL HIGH (ref 6–20)
CALCIUM: 8.3 mg/dL — AB (ref 8.9–10.3)
CO2: 25 mmol/L (ref 22–32)
Chloride: 109 mmol/L (ref 101–111)
Creatinine, Ser: 1.17 mg/dL — ABNORMAL HIGH (ref 0.44–1.00)
GFR calc Af Amer: 53 mL/min — ABNORMAL LOW (ref >60)
GFR, EST NON AFRICAN AMERICAN: 45 mL/min — AB (ref >60)
Glucose, Bld: 191 mg/dL — ABNORMAL HIGH (ref 65–99)
POTASSIUM: 5.4 mmol/L — AB (ref 3.5–5.1)
Sodium: 141 mmol/L (ref 135–145)

## 2014-11-14 LAB — CBC
HCT: 36 % (ref 35.0–47.0)
HEMOGLOBIN: 11.2 g/dL — AB (ref 12.0–16.0)
MCH: 25.8 pg — ABNORMAL LOW (ref 26.0–34.0)
MCHC: 31.1 g/dL — AB (ref 32.0–36.0)
MCV: 82.9 fL (ref 80.0–100.0)
Platelets: 276 10*3/uL (ref 150–440)
RBC: 4.34 MIL/uL (ref 3.80–5.20)
RDW: 18.1 % — ABNORMAL HIGH (ref 11.5–14.5)
WBC: 11.1 10*3/uL — AB (ref 3.6–11.0)

## 2014-11-14 MED ORDER — HYDROMORPHONE HCL 1 MG/ML IJ SOLN
2.0000 mg | INTRAMUSCULAR | Status: DC | PRN
  Administered 2014-11-14 (×2): 2 mg via INTRAVENOUS
  Administered 2014-11-15 (×2): 1 mg via INTRAVENOUS
  Filled 2014-11-14: qty 2
  Filled 2014-11-14: qty 1
  Filled 2014-11-14: qty 2
  Filled 2014-11-14: qty 1

## 2014-11-14 MED ORDER — ONDANSETRON HCL 4 MG/2ML IJ SOLN
4.0000 mg | Freq: Four times a day (QID) | INTRAMUSCULAR | Status: DC | PRN
  Administered 2014-11-14 – 2014-11-16 (×2): 4 mg via INTRAVENOUS
  Filled 2014-11-14 (×2): qty 2

## 2014-11-14 MED ORDER — INSULIN GLARGINE 100 UNIT/ML ~~LOC~~ SOLN
10.0000 [IU] | Freq: Every day | SUBCUTANEOUS | Status: DC
  Administered 2014-11-14: 10 [IU] via SUBCUTANEOUS
  Filled 2014-11-14 (×2): qty 0.1

## 2014-11-14 MED ORDER — HYDROMORPHONE HCL 1 MG/ML IJ SOLN
2.0000 mg | INTRAMUSCULAR | Status: AC
  Administered 2014-11-14: 2 mg via INTRAVENOUS
  Filled 2014-11-14: qty 2

## 2014-11-14 MED ORDER — CYCLOBENZAPRINE HCL 10 MG PO TABS
10.0000 mg | ORAL_TABLET | Freq: Three times a day (TID) | ORAL | Status: DC | PRN
  Administered 2014-11-16: 10 mg via ORAL
  Filled 2014-11-14: qty 1

## 2014-11-14 NOTE — Evaluation (Signed)
Physical Therapy Evaluation Patient Details Name: Jordan Brewer MRN: [id number] DOB: May 31, 1940 Today's Date: 11/14/2014   History of Present Illness  Pt fell going to get her mail and fractured her pelvis  Clinical Impression  Pt shows very good effort t/o exam and the 10 minutes of exercises apart from the exam. She is very motivated and wants to go home but pain is a big limiter for her today and she is unable to try ambulation, though she does need only min assist to get to sitting and standing with walker.      Follow Up Recommendations Home health PT;SNF (per today's performance she will likely need rehab)    Equipment Recommendations       Recommendations for Other Services       Precautions / Restrictions Precautions Precautions: Fall Restrictions Weight Bearing Restrictions: No      Mobility  Bed Mobility Overal bed mobility: Needs Assistance Bed Mobility: Supine to Sit;Sit to Supine     Supine to sit: Min assist Sit to supine: Min assist   General bed mobility comments: Pt needs HHA to pull up and some minimal assist to get to sitting EOB  Transfers Overall transfer level: Needs assistance Equipment used: Rolling walker (2 wheeled) Transfers: Sit to/from Stand Sit to Stand: Min assist         General transfer comment: Pt is very motivated to get up to sitting and though she is able to get to standing with only min assist she has considerable pain and is not able to try taking any shuffling steps   Ambulation/Gait                Stairs            Wheelchair Mobility    Modified Rankin (Stroke Patients Only)       Balance                                             Pertinent Vitals/Pain Pain Assessment: 0-10 Pain Score: 9  Pain Location: Pt reports that she has never had pain like this    Home Living Family/patient expects to be discharged to::  (pt wants to go home, informed her that may be  tough) Living Arrangements: Alone                    Prior Function Level of Independence: Independent with assistive device(s)         Comments: Pt reports that she does not get out of the house too much     Hand Dominance        Extremity/Trunk Assessment   Upper Extremity Assessment: Overall WFL for tasks assessed           Lower Extremity Assessment: Overall WFL for tasks assessed         Communication   Communication: No difficulties  Cognition Arousal/Alertness: Awake/alert Behavior During Therapy: Anxious (eager to do as much as she can) Overall Cognitive Status: Within Functional Limits for tasks assessed                      General Comments      Exercises General Exercises - Lower Extremity Ankle Circles/Pumps: AROM;10 reps Heel Slides: AROM;Strengthening;10 reps;Both Hip ABduction/ADduction: AROM;Both;10 reps      Assessment/Plan    PT Assessment Patient  needs continued PT services  PT Diagnosis Difficulty walking;Generalized weakness   PT Problem List Decreased strength;Decreased range of motion;Decreased activity tolerance;Decreased balance;Decreased mobility;Decreased coordination;Decreased cognition;Decreased knowledge of use of DME;Decreased safety awareness  PT Treatment Interventions Therapeutic exercise;Therapeutic activities;Gait training;Balance training;Functional mobility training;DME instruction;Stair training   PT Goals (Current goals can be found in the Care Plan section) Acute Rehab PT Goals Patient Stated Goal: pt would rather go home PT Goal Formulation: With patient Time For Goal Achievement: 11/28/14 Potential to Achieve Goals: Good    Frequency 7X/week   Barriers to discharge        Co-evaluation               End of Session Equipment Utilized During Treatment: Gait belt Activity Tolerance: Patient limited by pain Patient left: with bed alarm set           Time: 1513-1550 PT Time  Calculation (min) (ACUTE ONLY): 37 min   Charges:   PT Evaluation $Initial PT Evaluation Tier I: 1 Procedure PT Treatments $Therapeutic Exercise: 8-22 mins   PT G Codes:       Wayne Both, PT, DPT [phone]  Kreg Shropshire 11/14/2014, 5:31 PM

## 2014-11-14 NOTE — Care Management Note (Signed)
Case Management Note  Patient Details  Name: Jordan Brewer MRN: [id number] Date of Birth: [birth year]  Subjective/Objective:     74yo Jordan Jordan Brewer was admitted 11/13/14 with a right pubic rami fracture after a fall. Hx: Addisons Disease, DM, HTN, Bipolar DO, HTN, Neuropathy. Lives alone but reports that her son is in and out to check on her. PCP=Dr Hande. Pharmacy=[facility] in [place] who deliver her medications to her. Home assistive equipment includes a rolling walker, BSC, and a shower chair. No home oxygen. Drives self to appointments. No current home health. Jordan Brewer was given a list of home health providers in the event that home health is ordered for her. Case management will follow for discharge planning.                 Action/Plan:   Expected Discharge Date:                  Expected Discharge Plan:     In-House Referral:     Discharge planning Services     Post Acute Care Choice:    Choice offered to:     DME Arranged:    DME Agency:     HH Arranged:    [place] Agency:     Status of Service:     Medicare Important Message Given:  Yes-second notification given Date Medicare IM Given:    Medicare IM give by:    Date Additional Medicare IM Given:    Additional Medicare Important Message give by:     If discussed at [place] of Stay Meetings, dates discussed:    Additional Comments:  Jordan Brewer A, RN 11/14/2014, 5:14 PM

## 2014-11-14 NOTE — Progress Notes (Signed)
[place] at [place] NAME: Jordan Brewer    MR#:  [id number]  DATE OF BIRTH:  [birth year]  SUBJECTIVE:  CHIEF COMPLAINT:   Chief Complaint  Patient presents with  . Fall   - Patient very tearful, complaining of significant right groin and pubic pain. Also low back pain. Requesting for stronger pain medications.  REVIEW OF SYSTEMS:  Review of Systems  Constitutional: Negative for fever and chills.  HENT: Negative for ear discharge and ear pain.   Eyes: Negative for blurred vision and double vision.  Respiratory: Negative for cough, shortness of breath and wheezing.   Cardiovascular: Negative for chest pain, palpitations and leg swelling.  Gastrointestinal: Negative for nausea, vomiting, abdominal pain, diarrhea and constipation.  Genitourinary: Negative for dysuria.  Musculoskeletal: Positive for myalgias, joint pain and falls.       Right groin and pubic pain  Neurological: Positive for weakness. Negative for dizziness, sensory change, speech change, focal weakness, seizures and headaches.  Psychiatric/Behavioral: Negative for depression.    DRUG ALLERGIES:   Allergies  Allergen Reactions  . Codeine Nausea And Vomiting  . 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 Promethazine Nausea And Vomiting  . Lidoderm [Lidocaine] Rash    VITALS:  Blood pressure 110/53, pulse 82, temperature 97.5 F (36.4 C), temperature source Oral, resp. rate 18, height 5\' 4"  (1.626 m), weight 79.379 kg (175 lb), SpO2 96 %.  PHYSICAL EXAMINATION:  Physical Exam  GENERAL:  74 y.o.-year-old patient lying in the bed with no acute distress.  EYES: Pupils equal, round, reactive to light and accommodation. No scleral icterus. Extraocular muscles intact.  HEENT: Head atraumatic, normocephalic. Oropharynx and nasopharynx clear.  NECK:  Supple, no jugular venous distention. No thyroid enlargement, no tenderness.  LUNGS:  Normal breath sounds bilaterally, no wheezing, rales,rhonchi or crepitation. No use of accessory muscles of respiration.  CARDIOVASCULAR: S1, S2 normal. No murmurs, rubs, or gallops.  ABDOMEN: Soft, nontender, nondistended. Bowel sounds present. No organomegaly or mass.  EXTREMITIES: No pedal edema, cyanosis, or clubbing. Right hip in flexed position, muscle tightness noted around the hip joint and in the groin.. Tender to touch. No bruising or ecchymosis noted NEUROLOGIC: Cranial nerves II through XII are intact. Muscle strength 5/5 in all extremities-omitted movement of right leg due to pain. Sensation intact. Gait not checked.  PSYCHIATRIC: The patient is alert and oriented x 3.  SKIN: No obvious rash, lesion, or ulcer.    LABORATORY PANEL:   CBC  Recent Labs Lab 11/13/14 1457  WBC 20.4*  HGB 13.2  HCT 41.6  PLT 339   ------------------------------------------------------------------------------------------------------------------  Chemistries   Recent Labs Lab 11/13/14 1433  NA 135  K 5.0  CL 99*  CO2 24  GLUCOSE 239*  BUN 26*  CREATININE 1.23*  CALCIUM 9.1  AST 29  ALT 17  ALKPHOS 90  BILITOT 0.8   ------------------------------------------------------------------------------------------------------------------  Cardiac Enzymes No results for input(s): TROPONINI in the last 168 hours. ------------------------------------------------------------------------------------------------------------------  RADIOLOGY:  Dg Hip Unilat With Pelvis 2-3 Views Right  11/13/2014  CLINICAL DATA:  74 year old female with a history of fall. EXAM: DG HIP (WITH OR WITHOUT PELVIS) 2-3V RIGHT COMPARISON:  02/20/2011 FINDINGS: Diffuse osteopenia, limits sensitivity for the detection of nondisplaced fractures. Irregularity at the right aspect of the pubic symphysis, new from plain film 02/20/2011. Surgical changes of left total hip arthroplasty. Right hip projects normally over the  acetabulum. Unremarkable appearance  of proximal right femur. Surgical changes of the anatomic pelvis. Degenerative changes of the lumbar spine. Atherosclerosis IMPRESSION: Fracture of the right pubic symphysis, new from the plain film 02/20/2011, though appears to have some chronicity/ callus formation. Unremarkable appearance right hip. Osteopenia. Surgical changes of left total hip arthroplasty. Signed, Dulcy Fanny. Earleen Newport, DO [facility] Electronically Signed   By: Corrie Mckusick D.O.   On: 11/13/2014 13:57    EKG:   Orders placed or performed during the hospital encounter of 11/13/14  . ED EKG  . ED EKG  . EKG 12-Lead  . EKG 12-Lead    ASSESSMENT AND PLAN:   74 year old female with past medical history significant for hypertension, history of DVT, hyperlipidemia and Addison's disease comes to the hospital after a fall and pelvic fracture.  #1 fall and right pubic rami fracture-Will adjust pain medications. -X-ray with right pubic symphysis fracture. -Physical therapy consult. -Add muscle relaxants as well.  #2 hypertension-lisinopril on hold due to hyperkalemia. -Blood pressure is within normal limits.  #3. Addison's disease-continue daily supplements of hydrocortisone and Florinef  #4 leukocytosis-urine analysis is normal. Patient on steroids. Also stress reaction. -Continue to monitor  #5 hyperglycemia-no history of diabetes mellitus. Also on steroids. -Continue sliding scale insulin -Lantus at bedtime will be added  #6 DVT prophylaxis-Lovenox   All the records are reviewed and case discussed with Care Management/Social Workerr. Management plans discussed with the patient, family and they are in agreement.  CODE STATUS: Full code  TOTAL TIME TAKING CARE OF THIS PATIENT: 37 minutes.   POSSIBLE D/C IN 2 DAYS, DEPENDING ON CLINICAL CONDITION.   Gladstone Lighter M.D on 11/14/2014 at 1:39 PM  Between 7am to 6pm -  Pager - [phone]  After 6pm go to [email or web address] - password [email or web address] [facility]  Office  [phone]  CC: Primary care physician; Tracie Harrier, MD

## 2014-11-14 NOTE — Care Management Important Message (Signed)
Important Message  Patient Details  Name: Jordan Brewer MRN: [id number] Date of Birth: [birth year]   Medicare Important Message Given:  Yes-second notification given    Mardene Speak, RN 11/14/2014, 4:02 PM

## 2014-11-15 LAB — HEMOGLOBIN A1C: HEMOGLOBIN A1C: 10.4 % — AB (ref 4.0–6.0)

## 2014-11-15 LAB — CBC
HEMATOCRIT: 33.7 % — AB (ref 35.0–47.0)
HEMOGLOBIN: 10.7 g/dL — AB (ref 12.0–16.0)
MCH: 26 pg (ref 26.0–34.0)
MCHC: 31.6 g/dL — AB (ref 32.0–36.0)
MCV: 82.3 fL (ref 80.0–100.0)
Platelets: 269 10*3/uL (ref 150–440)
RBC: 4.1 MIL/uL (ref 3.80–5.20)
RDW: 18.5 % — ABNORMAL HIGH (ref 11.5–14.5)
WBC: 10 10*3/uL (ref 3.6–11.0)

## 2014-11-15 LAB — GLUCOSE, CAPILLARY
GLUCOSE-CAPILLARY: 201 mg/dL — AB (ref 65–99)
GLUCOSE-CAPILLARY: 163 mg/dL — AB (ref 65–99)
Glucose-Capillary: 189 mg/dL — ABNORMAL HIGH (ref 65–99)

## 2014-11-15 LAB — BASIC METABOLIC PANEL
Anion gap: 4 — ABNORMAL LOW (ref 5–15)
BUN: 25 mg/dL — ABNORMAL HIGH (ref 6–20)
CO2: 28 mmol/L (ref 22–32)
Calcium: 8.3 mg/dL — ABNORMAL LOW (ref 8.9–10.3)
Chloride: 107 mmol/L (ref 101–111)
Creatinine, Ser: 1.29 mg/dL — ABNORMAL HIGH (ref 0.44–1.00)
GFR calc non Af Amer: 40 mL/min — ABNORMAL LOW (ref >60)
GFR, EST AFRICAN AMERICAN: 47 mL/min — AB (ref >60)
Glucose, Bld: 231 mg/dL — ABNORMAL HIGH (ref 65–99)
POTASSIUM: 4.9 mmol/L (ref 3.5–5.1)
SODIUM: 139 mmol/L (ref 135–145)

## 2014-11-15 MED ORDER — INSULIN GLARGINE 100 UNIT/ML ~~LOC~~ SOLN
12.0000 [IU] | Freq: Every day | SUBCUTANEOUS | Status: DC
  Administered 2014-11-15: 12 [IU] via SUBCUTANEOUS
  Filled 2014-11-15 (×2): qty 0.12

## 2014-11-15 MED ORDER — HYDROMORPHONE HCL 1 MG/ML IJ SOLN
1.0000 mg | INTRAMUSCULAR | Status: DC | PRN
  Administered 2014-11-15 – 2014-11-16 (×4): 1 mg via INTRAVENOUS
  Filled 2014-11-15: qty 1
  Filled 2014-11-15: qty 3
  Filled 2014-11-15 (×2): qty 1
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
## 2014-11-15 NOTE — Progress Notes (Signed)
Physical Therapy Treatment Patient Details Name: Jordan Brewer MRN: [id number] DOB: 04-04-1940 Today's Date: 11/15/2014    History of Present Illness Pt fell going to get her mail and fractured her pelvis    PT Comments    Pt demonstrates improvement today in that she was able to take a few steps from bed to chair. Pt continues to be extremely anxious with screaming and yelling with all bed mobility, transfers and ambulation, but performs with Min A. Pt requires heavy cueing/encouragement to calm, breath and focus at task at hand. Education on how anxiety/stressful behavior with actually increase pain making task more difficult. Increased time for all mobility due to anxiety. Pt notes once she is sitting edge of bed and later in chair, she feels pretty comfortable. Pt encouraged to stay up in chair, perform gentle isometric exercises and ankle pumps each hour and deep breathing. Pt understands. Continue PT for progression of active range of motion right lower extremity, strength and all functional mobility. PT recommending skilled nursing facility placement at this time due to decreased level of safety with mobility due to anxiety and pain and inability to demonstrate ambulation beyond a few steps.   Follow Up Recommendations  SNF (cannot go home at this time due to lack of ambulation/safety)     Equipment Recommendations       Recommendations for Other Services       Precautions / Restrictions Precautions Precautions: Fall Restrictions Weight Bearing Restrictions: No    Mobility  Bed Mobility Overal bed mobility: Needs Assistance Bed Mobility: Supine to Sit     Supine to sit: Min assist     General bed mobility comments: cues to bridge hips to edge of bed and very min A to move BLEs to edge of bed. Min A for BUE pull to sit  Transfers Overall transfer level: Needs assistance Equipment used: Rolling walker (2 wheeled) Transfers: Sit to/from Stand Sit to Stand: Min  assist (screams and yells until she is sitting edge of bed, calms)         General transfer comment: Pt screams and yells stating she can't even though she is standing after second attempt. Requires significant encouragement and emotional support  Ambulation/Gait Ambulation/Gait assistance: Min assist Ambulation Distance (Feet): 3 Feet Assistive device: Rolling walker (2 wheeled) Gait Pattern/deviations: Step-to pattern Gait velocity: decreased Gait velocity interpretation: <1.8 ft/sec, indicative of risk for recurrent falls General Gait Details: takes 4 steps bed to chair with constant screaming/yelling that she can't and pelvis is breaking ; extremeily anxious and out of control during. Once seated apologizes and calms, noting she feels better sitting up now.   Stairs            Wheelchair Mobility    Modified Rankin (Stroke Patients Only)       Balance Overall balance assessment: Needs assistance Sitting-balance support: Bilateral upper extremity supported;Feet supported Sitting balance-Leahy Scale: Fair   Postural control: Left lateral lean Standing balance support: Bilateral upper extremity supported Standing balance-Leahy Scale: Poor Standing balance comment: heavy lean/forward lean on rw; Min physical A                    Cognition Arousal/Alertness: Awake/alert Behavior During Therapy: Anxious (extremely anxious and times tearful) Overall Cognitive Status: Within Functional Limits for tasks assessed                      Exercises General Exercises - Lower Extremity Ankle Circles/Pumps: AROM;Both;Supine;20 reps  Quad Sets: Strengthening;Both;15 reps;Supine Gluteal Sets: Strengthening;Both;20 reps;Supine Short Arc Quad: AROM;Both;20 reps;Supine Heel Slides: AAROM;Both;20 reps;Supine Hip ABduction/ADduction: AAROM;Both;20 reps;Supine    General Comments        Pertinent Vitals/Pain Pain Assessment: 0-10 Pain Score: 10-Worst pain  ever Pain Location: R hip, groin and low back Pain Descriptors / Indicators: Aching;Constant;Crying;Jabbing;Sharp;Stabbing (pt states it feels as if it is breaking more at times) Pain Intervention(s): Monitored during session;Premedicated before session;Repositioned;Utilized relaxation techniques;Other (comment) (breathing techniques)    Home Living                      Prior Function            PT Goals (current goals can now be found in the care plan section) Progress towards PT goals: Progressing toward goals (slowly)    Frequency  7X/week    PT Plan Discharge plan needs to be updated    Co-evaluation             End of Session Equipment Utilized During Treatment: Gait belt;Oxygen Activity Tolerance: Patient limited by pain (Limited by severe anxiety) Patient left: in chair;with call bell/phone within reach;with chair alarm set     Time: 0272-5366 PT Time Calculation (min) (ACUTE ONLY): 38 min  Charges:  $Gait Training: 8-22 mins $Therapeutic Exercise: 8-22 mins $Therapeutic Activity: 8-22 mins                    G Codes:      Charlaine Dalton 11/15/2014, 11:30 AM

## 2014-11-15 NOTE — Clinical Social Work Placement (Signed)
   CLINICAL SOCIAL WORK PLACEMENT  NOTE  Date:  11/15/2014  Patient Details  Name: Jordan Brewer MRN: [id number] Date of Birth: Oct 28, 1940  Clinical Social Work is seeking post-discharge placement for this patient at the [place] level of care (*CSW will initial, date and re-position this form in  chart as items are completed):  Yes   Patient/family provided with [place] Work Department's list of facilities offering this level of care within the geographic area requested by the patient (or if unable, by the patient's family).  Yes   Patient/family informed of their freedom to choose among providers that offer the needed level of care, that participate in Medicare, Medicaid or managed care program needed by the patient, have an available bed and are willing to accept the patient.  Yes   Patient/family informed of 's ownership interest in [facility] and [facility], as well as of the fact that they are under no obligation to receive care at these facilities.  PASRR submitted to EDS on 11/15/14     PASRR number received on       Existing PASRR number confirmed on       FL2 transmitted to all facilities in geographic area requested by pt/family on 11/15/14     FL2 transmitted to all facilities within larger geographic area on       Patient informed that his/her managed care company has contracts with or will negotiate with certain facilities, including the following:            Patient/family informed of bed offers received.  Patient chooses bed at       Physician recommends and patient chooses bed at      Patient to be transferred to   on  .  Patient to be transferred to facility by       Patient family notified on   of transfer.  Name of family member notified:        PHYSICIAN Please sign FL2     Additional Comment:    _______________________________________________ Loralyn Freshwater, LCSW 11/15/2014, 11:21  AM

## 2014-11-15 NOTE — Progress Notes (Signed)
MD said to discontinue IVF and to change dilaudid order to say 1mg -3mg 

## 2014-11-15 NOTE — Progress Notes (Signed)
PASARR received and [facility] authorization received. Auth # [id number].   Blima Rich, [place] [phone]
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
## 2014-11-15 NOTE — Progress Notes (Signed)
[place] at [place] NAME: Jordan Brewer    MR#:  [id number]  DATE OF BIRTH:  [birth year]  SUBJECTIVE:  CHIEF COMPLAINT:   Chief Complaint  Patient presents with  . Fall   - Pain improved since yesterday, has muscular pain on the right side from fall - PT recommended rehab  REVIEW OF SYSTEMS:  Review of Systems  Constitutional: Negative for fever and chills.  HENT: Negative for ear discharge and ear pain.   Eyes: Negative for blurred vision and double vision.  Respiratory: Negative for cough, shortness of breath and wheezing.   Cardiovascular: Negative for chest pain, palpitations and leg swelling.  Gastrointestinal: Negative for nausea, vomiting, abdominal pain, diarrhea and constipation.  Genitourinary: Negative for dysuria.  Musculoskeletal: Positive for myalgias, joint pain and falls.       Right groin and pubic pain  Neurological: Positive for weakness. Negative for dizziness, sensory change, speech change, focal weakness, seizures and headaches.  Psychiatric/Behavioral: Negative for depression.    DRUG ALLERGIES:   Allergies  Allergen Reactions  . Codeine Nausea And Vomiting  . 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 Promethazine Nausea And Vomiting  . Lidoderm [Lidocaine] Rash    VITALS:  Blood pressure 124/57, pulse 115, temperature 98 F (36.7 C), temperature source Oral, resp. rate 21, height 5\' 4"  (1.626 m), weight 79.379 kg (175 lb), SpO2 98 %.  PHYSICAL EXAMINATION:  Physical Exam  GENERAL:  74 y.o.-year-old patient lying in the bed with no acute distress.  EYES: Pupils equal, round, reactive to light and accommodation. No scleral icterus. Extraocular muscles intact.  HEENT: Head atraumatic, normocephalic. Oropharynx and nasopharynx clear.  NECK:  Supple, no jugular venous distention. No thyroid enlargement, no tenderness.  LUNGS: Normal breath sounds bilaterally, no  wheezing, rales,rhonchi or crepitation. No use of accessory muscles of respiration.  CARDIOVASCULAR: S1, S2 normal. No murmurs, rubs, or gallops.  ABDOMEN: Soft, nontender, nondistended. Bowel sounds present. No organomegaly or mass.  EXTREMITIES: No pedal edema, cyanosis, or clubbing. Right hip in flexed position, muscle tightness noted around the hip joint and in the groin.. Tender to touch. No bruising or ecchymosis noted NEUROLOGIC: Cranial nerves II through XII are intact. Muscle strength 5/5 in all extremities-omitted movement of right leg due to pain. Sensation intact. Gait not checked.  PSYCHIATRIC: The patient is alert and oriented x 3.  SKIN: No obvious rash, lesion, or ulcer.    LABORATORY PANEL:   CBC  Recent Labs Lab 11/15/14 0336  WBC 10.0  HGB 10.7*  HCT 33.7*  PLT 269   ------------------------------------------------------------------------------------------------------------------  Chemistries   Recent Labs Lab 11/13/14 1433 11/15/14 0336  NA 135 139  K 5.0 4.9  CL 99* 107  CO2 24 28  GLUCOSE 239* 231*  BUN 26* 25*  CREATININE 1.23* 1.29*  CALCIUM 9.1 8.3*  AST 29  --   ALT 17  --   ALKPHOS 90  --   BILITOT 0.8  --    ------------------------------------------------------------------------------------------------------------------  Cardiac Enzymes No results for input(s): TROPONINI in the last 168 hours. ------------------------------------------------------------------------------------------------------------------  RADIOLOGY:  No results found.  EKG:   Orders placed or performed during the hospital encounter of 11/13/14  . ED EKG  . ED EKG  . EKG 12-Lead  . EKG 12-Lead    ASSESSMENT AND PLAN:   74 year old female with past medical history significant for hypertension, history of DVT, hyperlipidemia and Addison's  disease comes to the hospital after a fall and pelvic fracture.  #1 fall and right pubic rami fracture-Will adjust pain  medications. On IV dilaudid and PO norco -X-ray with right pubic symphysis fracture. -Physical therapy consult. -Added muscle relaxants as well.  #2 hypertension-lisinopril on hold due to hyperkalemia. -Blood pressure is within normal limits.  #3. Addison's disease-continue daily supplements of hydrocortisone and Florinef  #4 leukocytosis-urine analysis is normal. Patient on steroids. Also stress reaction. -resolved now  #5 Hyperglycemia-no history of diabetes mellitus. Also on steroids chronically, so takes lantus at home -Continue sliding scale insulin -Lantus at bedtime added- slightly increase dose  #6 DVT prophylaxis-Lovenox  Physical therapy recommended rehab   All the records are reviewed and case discussed with Care Management/Social Workerr. Management plans discussed with the patient, family and they are in agreement.  CODE STATUS: Full code  TOTAL TIME TAKING CARE OF THIS PATIENT: 37 minutes.   POSSIBLE D/C TOMORROW, DEPENDING ON CLINICAL CONDITION.   Gladstone Lighter M.D on 11/15/2014 at 1:56 PM  Between 7am to 6pm - Pager - [phone]  After 6pm go to [email or web address] - password [email or web address] [facility]  Office  [phone]  CC: Primary care physician; Tracie Harrier, MD

## 2014-11-15 NOTE — Progress Notes (Signed)
Clinical Education officer, museum (CSW) extended bed offers to patient. She chose [facility]. [facility] case manager aware of above. [facility] is pending. CSW faxed requested clinicals to [facility]. Plan is for patient to D/C tomorrow to [facility]. CSW will continue to follow and assist as needed.   Blima Rich, [place] [phone]

## 2014-11-15 NOTE — Progress Notes (Signed)
Inpatient Diabetes Program Recommendations  AACE/ADA: New Consensus Statement on Inpatient Glycemic Control (2015)  Target Ranges:  Prepandial:   less than 140 mg/dL      [facility] postprandial:   less than 180 mg/dL (1-2 hours)      Critically ill patients:  140 - 180 mg/dL   Review of Glycemic Control  Results for Jordan Brewer, Jordan Brewer (MRN [id number]) as of 11/15/2014 07:49  Ref. Range 11/14/2014 07:25 11/14/2014 11:11 11/14/2014 16:27 11/14/2014 21:45 11/15/2014 07:22  Glucose-Capillary Latest Ref Range: 65-99 mg/dL 154 (H) 173 (H) 229 (H) 168 (H) 201 (H)    Diabetes history: Type 2, A1C 10.4% Outpatient Diabetes medications: Humalog tid per sliding scale, Lantus 10 units qhs Current orders for Inpatient glycemic control: Lantus 10 units qhs, Novolog 0-9 units tid, Novolog 0-5 units qhs  Spoke with the patient at the bedside and she confirms that she typically takes 8-10 or 12 units of Novolog insulin tid- tells me blood sugars have been 200 to as high as 455mg /dl over the past three weeks.   Last A1Cs  05/05/14  =11.5%                12/28/13=9.1%  Inpatient Diabetes Program Recommendations: Blood sugars remain elevated despite current orders for Novolog insulin.  Consider adding Novolog 4units tid with meals and continue current Novolog correction as ordered.  Fasting blood sugar elevated likely a result of steroids, consider increasing Lantus to 12 units qhs (please decrease dose as steroids are tapered)  Gentry Fitz, RN, BA, MHA, CDE Diabetes Coordinator Inpatient Diabetes Program  [phone] (Team Pager) [phone] ([place]) 11/15/2014 7:59 AM

## 2014-11-15 NOTE — NC FL2 (Signed)
[place] LEVEL OF CARE SCREENING TOOL     IDENTIFICATION  Patient Name: Jordan Brewer Birthdate: March 22, 1940 Sex: female Admission Date (Current Location): 11/13/2014  [place] and [place] Number:  [facility] )   Facility and Address:  [facility], [address], [place], [place] [address]      Provider Number: [id number] [phone])  Attending Physician Name and Address:  Gladstone Lighter, MD  Relative Name and Phone Number:       Current Level of Care: Hospital Recommended Level of Care: [place] Prior Approval Number:    Date Approved/Denied:   PASRR Number:    Discharge Plan: SNF    Current Diagnoses: Patient Active Problem List   Diagnosis Date Noted  . Pelvic fracture, closed, initial encounter 11/13/2014  . Syncope and collapse 04/10/2012  . Type II or unspecified type diabetes mellitus without mention of complication, not stated as uncontrolled 04/10/2012  . Unspecified essential hypertension 04/10/2012  . Bipolar I disorder, most recent episode (or current) unspecified 04/10/2012  . Addison's disease ([place]) 04/10/2012  . Hypoglycemia 04/10/2012  . Chronic kidney disease 04/10/2012  . SAH (subarachnoid hemorrhage) ([place]) 04/10/2012    Orientation ACTIVITIES/SOCIAL BLADDER RESPIRATION    Self, Time, Situation, Place  Passive, Active Continent O2 (As needed) (3.5 liters of oxygen )  BEHAVIORAL SYMPTOMS/MOOD NEUROLOGICAL BOWEL NUTRITION STATUS   (none )  (none ) Continent Diet (Diet: 2 Grams Sodium. )  PHYSICIAN VISITS COMMUNICATION OF NEEDS Height & Weight Skin  30 days Verbally 5\' 4"  (162.6 cm) 175 lbs. Normal          AMBULATORY STATUS RESPIRATION    Assist extensive O2 (As needed) (3.5 liters of oxygen )      Personal Care Assistance Level of Assistance  Bathing, Feeding, Dressing Bathing Assistance: Limited assistance Feeding assistance: Independent Dressing Assistance: Limited  assistance      Functional Limitations Info  Sight, Hearing, Speech Sight Info: Adequate Hearing Info: Adequate Speech Info: Adequate       SPECIAL CARE FACTORS FREQUENCY  PT (By licensed PT), OT (By licensed OT)     PT Frequency:  (5) OT Frequency:  (5)           Additional Factors Info  Code Status, Allergies, Psychotropic, Insulin Sliding Scale Code Status Info:  (Full Code. ) Allergies Info:  (Codeine, Prednisone, Promethazine, Lidoderm Lidocaine) Psychotropic Info:  (Cymbalta and Xanax) Insulin Sliding Scale Info:  (Novolong Insulin Injections )       Current Medications (11/15/2014): Current Facility-Administered Medications  Medication Dose Route Frequency Provider Last Rate Last Dose  . acetaminophen (TYLENOL) tablet 650 mg  650 mg Oral Q6H PRN Nicholes Mango, MD       Or  . acetaminophen (TYLENOL) suppository 650 mg  650 mg Rectal Q6H PRN Nicholes Mango, MD      . ALPRAZolam Duanne Moron) tablet 0.5 mg  0.5 mg Oral BID PRN Nicholes Mango, MD   0.5 mg at 11/14/14 2201  . cyclobenzaprine (FLEXERIL) tablet 10 mg  10 mg Oral TID PRN Gladstone Lighter, MD      . docusate sodium (COLACE) capsule 100 mg  100 mg Oral BID Nicholes Mango, MD   100 mg at 11/15/14 0834  . DULoxetine (CYMBALTA) DR capsule 60 mg  60 mg Oral Daily Nicholes Mango, MD   60 mg at 11/15/14 0835  . enoxaparin (LOVENOX) injection 40 mg  40 mg Subcutaneous Q24H Nicholes Mango, MD   40 mg at  11/14/14 1638  . fenofibrate tablet 160 mg  160 mg Oral Daily Nicholes Mango, MD   160 mg at 11/15/14 0834  . fludrocortisone (FLORINEF) tablet 0.1 mg  0.1 mg Oral Daily Nicholes Mango, MD   0.1 mg at 11/15/14 0834  . HYDROcodone-acetaminophen (NORCO/VICODIN) 5-325 MG per tablet 1-2 tablet  1-2 tablet Oral Q4H PRN Nicholes Mango, MD   2 tablet at 11/15/14 0831  . hydrocortisone (CORTEF) tablet 10 mg  10 mg Oral QHS Nicholes Mango, MD   10 mg at 11/14/14 2200  . hydrocortisone (CORTEF) tablet 20 mg  20 mg Oral q morning - 10a Nicholes Mango, MD   20 mg at  11/15/14 0832  . HYDROmorphone (DILAUDID) injection 1-3 mg  1-3 mg Intravenous Q4H PRN Gladstone Lighter, MD      . insulin aspart (novoLOG) injection 0-5 Units  0-5 Units Subcutaneous QHS Nicholes Mango, MD   0 Units at 11/13/14 2115  . insulin aspart (novoLOG) injection 0-9 Units  0-9 Units Subcutaneous TID WC Nicholes Mango, MD   3 Units at 11/15/14 0839  . insulin glargine (LANTUS) injection 10 Units  10 Units Subcutaneous QHS Gladstone Lighter, MD   10 Units at 11/14/14 2204  . ondansetron (ZOFRAN) injection 4 mg  4 mg Intravenous Q6H PRN Gladstone Lighter, MD   4 mg at 11/14/14 2006  . pantoprazole (PROTONIX) EC tablet 40 mg  40 mg Oral Daily Nicholes Mango, MD   40 mg at 11/15/14 0834  . polyethylene glycol 0.4% and propylene glycol 0.3% (SYSTANE) ophthalmic gel   Both Eyes Daily PRN Nicholes Mango, MD      . senna-docusate (Senokot-S) tablet 1 tablet  1 tablet Oral QHS PRN Nicholes Mango, MD       Do not use this list as official medication orders. Please verify with discharge summary.  Discharge Medications:   Medication List    ASK your doctor about these medications        amLODipine 10 MG tablet  Commonly known as:  NORVASC  Take 10 mg by mouth daily.     clotrimazole-betamethasone cream  Commonly known as:  LOTRISONE  Apply 1 application topically at bedtime as needed. For dry itchy skin.     DULoxetine 60 MG capsule  Commonly known as:  CYMBALTA  Take 60 mg by mouth daily.     fludrocortisone 0.1 MG tablet  Commonly known as:  FLORINEF  Take 0.1 mg by mouth daily.     HUMALOG KWIKPEN 100 UNIT/ML KiwkPen  Generic drug:  insulin lispro  3 (three) times daily before meals. Inject as directed per sliding scale     hydrocortisone 10 MG tablet  Commonly known as:  CORTEF  Take 10-20 mg by mouth See admin instructions. Take 2 tablets (20mg ) orally every morning. Take 1 tablet orally once a day (at bedtime).     insulin glargine 100 unit/mL Sopn  Commonly known as:  LANTUS  Inject  10 Units into the skin at bedtime.     lisinopril 20 MG tablet  Commonly known as:  PRINIVIL,ZESTRIL  Take 20 mg by mouth 2 (two) times daily.     lovastatin 40 MG tablet  Commonly known as:  MEVACOR  Take 40 mg by mouth at bedtime.     omeprazole 40 MG capsule  Commonly known as:  PRILOSEC  Take 40 mg by mouth 2 (two) times daily.     SYSTANE OP  Apply 1 drop to eye daily as needed.  For burning/irritated eyes        Relevant Imaging Results:  Relevant Lab Results:  Recent Labs    Additional Information  (SSN: [id number])  Loralyn Freshwater, LCSW

## 2014-11-16 DIAGNOSIS — I80299 Phlebitis and thrombophlebitis of other deep vessels of unspecified lower extremity: Secondary | ICD-10-CM | POA: Diagnosis not present

## 2014-11-16 DIAGNOSIS — E1140 Type 2 diabetes mellitus with diabetic neuropathy, unspecified: Secondary | ICD-10-CM | POA: Diagnosis not present

## 2014-11-16 DIAGNOSIS — K210 Gastro-esophageal reflux disease with esophagitis: Secondary | ICD-10-CM | POA: Diagnosis not present

## 2014-11-16 DIAGNOSIS — I10 Essential (primary) hypertension: Secondary | ICD-10-CM | POA: Diagnosis not present

## 2014-11-16 DIAGNOSIS — E785 Hyperlipidemia, unspecified: Secondary | ICD-10-CM | POA: Diagnosis not present

## 2014-11-16 DIAGNOSIS — T148 Other injury of unspecified body region: Secondary | ICD-10-CM | POA: Diagnosis not present

## 2014-11-16 DIAGNOSIS — S32501A Unspecified fracture of right pubis, initial encounter for closed fracture: Secondary | ICD-10-CM | POA: Diagnosis not present

## 2014-11-16 DIAGNOSIS — F329 Major depressive disorder, single episode, unspecified: Secondary | ICD-10-CM | POA: Diagnosis not present

## 2014-11-16 DIAGNOSIS — N189 Chronic kidney disease, unspecified: Secondary | ICD-10-CM | POA: Diagnosis not present

## 2014-11-16 DIAGNOSIS — Z5189 Encounter for other specified aftercare: Secondary | ICD-10-CM | POA: Diagnosis not present

## 2014-11-16 DIAGNOSIS — S329XXA Fracture of unspecified parts of lumbosacral spine and pelvis, initial encounter for closed fracture: Secondary | ICD-10-CM | POA: Diagnosis not present

## 2014-11-16 DIAGNOSIS — M6281 Muscle weakness (generalized): Secondary | ICD-10-CM | POA: Diagnosis not present

## 2014-11-16 DIAGNOSIS — F419 Anxiety disorder, unspecified: Secondary | ICD-10-CM | POA: Diagnosis not present

## 2014-11-16 DIAGNOSIS — K219 Gastro-esophageal reflux disease without esophagitis: Secondary | ICD-10-CM | POA: Diagnosis not present

## 2014-11-16 DIAGNOSIS — E119 Type 2 diabetes mellitus without complications: Secondary | ICD-10-CM | POA: Diagnosis not present

## 2014-11-16 DIAGNOSIS — E271 Primary adrenocortical insufficiency: Secondary | ICD-10-CM | POA: Diagnosis not present

## 2014-11-16 DIAGNOSIS — F3176 Bipolar disorder, in full remission, most recent episode depressed: Secondary | ICD-10-CM | POA: Diagnosis not present

## 2014-11-16 DIAGNOSIS — E784 Other hyperlipidemia: Secondary | ICD-10-CM | POA: Diagnosis not present

## 2014-11-16 LAB — GLUCOSE, CAPILLARY
GLUCOSE-CAPILLARY: 186 mg/dL — AB (ref 65–99)
Glucose-Capillary: 181 mg/dL — ABNORMAL HIGH (ref 65–99)
Glucose-Capillary: 191 mg/dL — ABNORMAL HIGH (ref 65–99)

## 2014-11-16 MED ORDER — CYCLOBENZAPRINE HCL 10 MG PO TABS: 10.0000 mg | ORAL_TABLET | Freq: Three times a day (TID) | ORAL | Status: DC | PRN

## 2014-11-16 MED ORDER — AMLODIPINE BESYLATE 5 MG PO TABS: 5.0000 mg | ORAL_TABLET | Freq: Every day | ORAL | Status: DC

## 2014-11-16 MED ORDER — ALPRAZOLAM 0.5 MG PO TABS: 0.5000 mg | ORAL_TABLET | Freq: Three times a day (TID) | ORAL | Status: DC | PRN

## 2014-11-16 MED ORDER — DOCUSATE SODIUM 100 MG PO CAPS: 100.0000 mg | ORAL_CAPSULE | Freq: Two times a day (BID) | ORAL | Status: AC

## 2014-11-16 MED ORDER — HYDROMORPHONE HCL 2 MG PO TABS: 2.0000 mg | ORAL_TABLET | ORAL | Status: DC | PRN

## 2014-11-16 NOTE — Discharge Summary (Signed)
[place] [facility] [place] NAME: Jordan Brewer    MR#:  [id number]  DATE OF BIRTH:  [birth year]  DATE OF ADMISSION:  11/13/2014 ADMITTING PHYSICIAN: Nicholes Mango, MD  DATE OF DISCHARGE: 11/16/2014  PRIMARY CARE PHYSICIAN: Tracie Harrier, MD    ADMISSION DIAGNOSIS:  Closed fracture of pelvis, unspecified part of pelvis, initial encounter ([place]) [S32.9XXA]  DISCHARGE DIAGNOSIS:  Active Problems:   Pelvic fracture, closed, initial encounter   SECONDARY DIAGNOSIS:   Past Medical History  Diagnosis Date  . Hypertension   . Hypercholesteremia   . DVT, lower extremity ([place]) 09/2010    "left; after hip replacement" (04/10/2012)  . Addison's disease ([place])   . Type II diabetes mellitus ([employer])   . History of blood transfusion   . GERD (gastroesophageal reflux disease)   . Arthritis     "all over my body" (04/10/2012)  . Chronic back pain     "since my hip OR in 09/2010"  . Fall at home 02/2012    'foot got caught on heating pad cord; broke left ankle" (04/10/2012)  . Ankle fracture, left 02/2012  . Syncope and collapse 04/10/2012    "w/loss of consciousness" (04/10/2012)  . Depression   . Bipolar depression [facility])     HOSPITAL COURSE:   74 year old female with past medical history significant for hypertension, history of DVT, hyperlipidemia and Addison's disease comes to the hospital after a fall and pelvic fracture.  #1 Fall and right pubic rami-  will be discharged on oral Dilaudid -X-ray with right pubic symphysis fracture. -Physical therapy consulted -Added muscle relaxants as well. - patient will be discharged to rehab  #2 hypertension-lisinopril on hold due to hyperkalemia. -Blood pressure is within normal limits. Patient will be discharged on Norvasc  #3. Addison's disease-continue daily supplements of hydrocortisone and Florinef  #4 leukocytosis-urine analysis is normal. Patient on steroids. Also stress reaction. -resolved  now  #5 DM- Also on steroids chronically, takes lantus at home -Lantus at bedtime and also TID novolog  5 units prior to meals  Discharge to rehabilitation today   DISCHARGE CONDITIONS:   Guarded  CONSULTS OBTAINED:    none  DRUG ALLERGIES:   Allergies  Allergen Reactions  . Codeine Nausea And Vomiting  . 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 Promethazine Nausea And Vomiting  . Lidoderm [Lidocaine] Rash    DISCHARGE MEDICATIONS:   Current Discharge Medication List    START taking these medications   Details  cyclobenzaprine (FLEXERIL) 10 MG tablet Take 1 tablet (10 mg total) by mouth 3 (three) times daily as needed for muscle spasms. Qty: 20 tablet, Refills: 0    docusate sodium (COLACE) 100 MG capsule Take 1 capsule (100 mg total) by mouth 2 (two) times daily. Qty: 10 capsule, Refills: 0    HYDROmorphone (DILAUDID) 2 MG tablet Take 1 tablet (2 mg total) by mouth every 4 (four) hours as needed for severe pain. Qty: 24 tablet, Refills: 0      CONTINUE these medications which have CHANGED   Details  ALPRAZolam (XANAX) 0.5 MG tablet Take 1 tablet (0.5 mg total) by mouth 3 (three) times daily as needed for anxiety. Qty: 20 tablet, Refills: 0    amLODipine (NORVASC) 5 MG tablet Take 1 tablet (5 mg total) by mouth daily. Qty: 30 tablet, Refills: 0      CONTINUE these medications which have NOT CHANGED   Details  clotrimazole-betamethasone (LOTRISONE) cream Apply 1 application topically at bedtime as needed. For dry itchy skin.    DULoxetine (CYMBALTA) 60 MG capsule Take 60 mg by mouth daily.    fludrocortisone (FLORINEF) 0.1 MG tablet Take 0.1 mg by mouth daily.    HUMALOG KWIKPEN 100 UNIT/ML KiwkPen 3 (three) times daily before meals. Inject as directed per sliding scale    hydrocortisone (CORTEF) 10 MG tablet Take 10-20 mg by mouth See admin instructions. Take 2 tablets (20mg ) orally every morning. Take 1 tablet orally once  a day (at bedtime).    insulin glargine (LANTUS) 100 unit/mL SOPN Inject 10 Units into the skin at bedtime.    lovastatin (MEVACOR) 40 MG tablet Take 40 mg by mouth at bedtime.    omeprazole (PRILOSEC) 40 MG capsule Take 40 mg by mouth 2 (two) times daily.    Polyethyl Glycol-Propyl Glycol (SYSTANE OP) Apply 1 drop to eye daily as needed. For burning/irritated eyes      STOP taking these medications     lisinopril (PRINIVIL,ZESTRIL) 20 MG tablet      fenofibrate 160 MG tablet          DISCHARGE INSTRUCTIONS:   1. PCP follow-up in 1-2 weeks  If you experience worsening of your admission symptoms, develop shortness of breath, life threatening emergency, suicidal or homicidal thoughts you must seek medical attention immediately by calling 911 or calling your MD immediately  if symptoms less severe.  You Must read complete instructions/literature along with all the possible adverse reactions/side effects for all the Medicines you take and that have been prescribed to you. Take any new Medicines after you have completely understood and accept all the possible adverse reactions/side effects.   Please note  You were cared for by a hospitalist during your hospital stay. If you have any questions about your discharge medications or the care you received while you were in the hospital after you are discharged, you can call the unit and asked to speak with the hospitalist on call if the hospitalist that took care of you is not available. Once you are discharged, your primary care physician will handle any further medical issues. Please note that NO REFILLS for any discharge medications will be authorized once you are discharged, as it is imperative that you return to your primary care physician (or establish a relationship with a primary care physician if you do not have one) for your aftercare needs so that they can reassess your need for medications and monitor your lab values.    Today    CHIEF COMPLAINT:   Chief Complaint  Patient presents with  . Fall    VITAL SIGNS:  Blood pressure 138/68, pulse 110, temperature 97.9 F (36.6 C), temperature source Oral, resp. rate 18, height 5\' 4"  (1.626 m), weight 79.379 kg (175 lb), SpO2 97 %.  I/O:   Intake/Output Summary (Last 24 hours) at 11/16/14 1317 Last data filed at 11/16/14 1112  Gross per 24 hour  Intake    480 ml  Output   1700 ml  Net  -1220 ml    PHYSICAL EXAMINATION:   Physical Exam GENERAL: 74 y.o.-year-old patient lying in the bed with no acute distress.  EYES: Pupils equal, round, reactive to light and accommodation. No scleral icterus. Extraocular muscles intact.  HEENT: Head atraumatic, normocephalic. Oropharynx and nasopharynx clear.  NECK: Supple, no jugular venous distention. No thyroid enlargement, no tenderness.  LUNGS: Normal breath sounds bilaterally, no wheezing, rales,rhonchi or crepitation. No use  of accessory muscles of respiration.  CARDIOVASCULAR: S1, S2 normal. No murmurs, rubs, or gallops.  ABDOMEN: Soft, nontender, nondistended. Bowel sounds present. No organomegaly or mass.  EXTREMITIES: No pedal edema, cyanosis, or clubbing. Right hip in flexed position, muscle tightness noted around the hip joint and in the groin.. Tender to touch. No bruising or ecchymosis noted NEUROLOGIC: Cranial nerves II through XII are intact. Muscle strength 5/5 in all extremities-omitted movement of right leg due to pain. Sensation intact. Gait not checked.  PSYCHIATRIC: The patient is alert and oriented x 3.  SKIN: No obvious rash, lesion, or ulcer.   DATA REVIEW:   CBC  Recent Labs Lab 11/15/14 0336  WBC 10.0  HGB 10.7*  HCT 33.7*  PLT 269    Chemistries   Recent Labs Lab 11/13/14 1433 11/15/14 0336  NA 135 139  K 5.0 4.9  CL 99* 107  CO2 24 28  GLUCOSE 239* 231*  BUN 26* 25*  CREATININE 1.23* 1.29*  CALCIUM 9.1 8.3*  AST 29  --   ALT 17  --   ALKPHOS 90  --    BILITOT 0.8  --     Cardiac Enzymes No results for input(s): TROPONINI in the last 168 hours.  Microbiology Results  Results for orders placed or performed in visit on 07/13/13  Urine culture     Status: None   Collection Time: 07/13/13  8:58 PM  Result Value Ref Range Status   Micro Text Report   Final       SOURCE: CLEAN CATCH    ORGANISM 1                >100,000 CFU/ML ESCHERICHIA COLI   ANTIBIOTIC                    ORG#1     AMPICILLIN                    S         CEFAZOLIN                     S         CEFOXITIN                     S         CEFTRIAXONE                   S         CIPROFLOXACIN                 S         GENTAMICIN                    S         IMIPENEM                      S         LEVOFLOXACIN                  S         NITROFURANTOIN                S         TRIMETHOPRIM/SULFAMETHOXAZOLE S           Culture, blood (single)     Status: None   Collection Time: 07/13/13  9:05 PM  Result Value Ref Range Status   Micro Text Report   Final       COMMENT                   NO GROWTH AEROBICALLY/ANAEROBICALLY IN 5 DAYS   ANTIBIOTIC                                                      Culture, blood (single)     Status: None   Collection Time: 07/13/13  9:10 PM  Result Value Ref Range Status   Micro Text Report   Final       COMMENT                   NO GROWTH AEROBICALLY/ANAEROBICALLY IN 5 DAYS   ANTIBIOTIC                                                        RADIOLOGY:  No results found.  EKG:   Orders placed or performed during the hospital encounter of 11/13/14  . ED EKG  . ED EKG  . EKG 12-Lead  . EKG 12-Lead      Management plans discussed with the patient, family and they are in agreement.  CODE STATUS:     Code Status Orders        Start     Ordered   11/13/14 1738  Full code   Continuous     11/13/14 1738      TOTAL TIME TAKING CARE OF THIS PATIENT: 37 minutes.    Gladstone Lighter M.D on 11/16/2014 at 1:17  PM  Between 7am to 6pm - Pager - [phone]  After 6pm go to [email or web address] - password [email or web address] [place] [facility]  Office  [phone]  CC: Primary care physician; Tracie Harrier, MD

## 2014-11-16 NOTE — Clinical Social Work Placement (Signed)
   CLINICAL SOCIAL WORK PLACEMENT  NOTE  Date:  11/16/2014  Patient Details  Name: Jordan Brewer MRN: [id number] Date of Birth: 1940/06/10  Clinical Social Work is seeking post-discharge placement for this patient at the [place] level of care (*CSW will initial, date and re-position this form in  chart as items are completed):  Yes   Patient/family provided with [place] Work Department's list of facilities offering this level of care within the geographic area requested by the patient (or if unable, by the patient's family).  Yes   Patient/family informed of their freedom to choose among providers that offer the needed level of care, that participate in Medicare, Medicaid or managed care program needed by the patient, have an available bed and are willing to accept the patient.  Yes   Patient/family informed of 's ownership interest in [facility] and [facility], as well as of the fact that they are under no obligation to receive care at these facilities.  PASRR submitted to EDS on 11/15/14     PASRR number received on 11/15/14     Existing PASRR number confirmed on       FL2 transmitted to all facilities in geographic area requested by pt/family on 11/15/14     FL2 transmitted to all facilities within larger geographic area on       Patient informed that his/her managed care company has contracts with or will negotiate with certain facilities, including the following:        Yes   Patient/family informed of bed offers received.  Patient chooses bed at  ([facility] )     Physician recommends and patient chooses bed at      Patient to be transferred to  ([facility] ) on 11/16/14.  Patient to be transferred to facility by  [facility] )     Patient family notified on 11/16/14 of transfer.  Name of family member notified:   (Son Edd Arbour is aware of D/C today. )     PHYSICIAN       Additional Comment:     _______________________________________________ Loralyn Freshwater, LCSW 11/16/2014, 2:15 PM

## 2014-11-16 NOTE — Progress Notes (Signed)
[facility]ms here transported [facility] to [facility]

## 2014-11-16 NOTE — Progress Notes (Signed)
Physical Therapy Treatment Patient Details Name: Leisel Pinette MRN: [id number] DOB: 05-13-1940 Today's Date: 11/16/2014    History of Present Illness Pt fell going to get her mail and fractured her pelvis    PT Comments    Pt continues to demonstrate severe anxiety and distress with transfers and minimal ambulation bed to chair. Pt eager and willing to try, but does so with severe, non tolerable pain. Pt requires Min A physically, but strong encouragement and cues for breathing through pain. Pt demonstrates poor safety with transfers due to pain and impulsivity. Pt has discharge orders today to skilled nursing facility.   Follow Up Recommendations  SNF     Equipment Recommendations       Recommendations for Other Services       Precautions / Restrictions Precautions Precautions: Fall Restrictions Weight Bearing Restrictions: No    Mobility  Bed Mobility Overal bed mobility: Needs Assistance Bed Mobility: Supine to Sit     Supine to sit: Min guard     General bed mobility comments: Improved self effort for supine to sit with use of trapeze and rails requiring increased time/effort and only Min guard  Transfers Overall transfer level: Needs assistance Equipment used: Rolling walker (2 wheeled) Transfers: Sit to/from Stand Sit to Stand: Min assist (requires 2 attempts before completing stand )         General transfer comment: continues to yell and scream with stand  stating she can't, but does perform with Min physical assist and a lot of encouragement   Ambulation/Gait Ambulation/Gait assistance: Min assist Ambulation Distance (Feet): 3 Feet Assistive device: Rolling walker (2 wheeled) Gait Pattern/deviations: Step-to pattern;Decreased step length - right;Decreased step length - left;Decreased stance time - right;Decreased weight shift to right;Antalgic;Trunk flexed;Wide base of support Gait velocity: decreased Gait velocity interpretation: <1.8 ft/sec,  indicative of risk for recurrent falls General Gait Details: takes several effortful steps to chair with extreme anxiety and poor, uncontrolled descent to chair; heart rate increased to 124 beats per minute   Stairs            Wheelchair Mobility    Modified Rankin (Stroke Patients Only)       Balance                                    Cognition Arousal/Alertness: Awake/alert Behavior During Therapy: Anxious (tearful) Overall Cognitive Status: Within Functional Limits for tasks assessed                      Exercises      General Comments        Pertinent Vitals/Pain Pain Assessment: 0-10 Pain Score: 10-Worst pain ever Pain Location: R hip, groin and buttock Pain Descriptors / Indicators: Constant ("feels as if it is breaking more")    Home Living                      Prior Function            PT Goals (current goals can now be found in the care plan section) Progress towards PT goals: Progressing toward goals (slowly)    Frequency  7X/week    PT Plan Discharge plan needs to be updated    Co-evaluation             End of Session Equipment Utilized During Treatment: Gait belt Activity Tolerance: Patient limited  by pain Patient left: in chair;with call bell/phone within reach;with chair alarm set     Time: 8366-2947 PT Time Calculation (min) (ACUTE ONLY): 13 min  Charges:  $Gait Training: 8-22 mins                    G Codes:      Charlaine Dalton 11/16/2014, 12:05 PM

## 2014-11-16 NOTE — Progress Notes (Signed)
Report called to Belenda Cruise  , pt ready for transport

## 2014-11-16 NOTE — Progress Notes (Signed)
Inpatient Diabetes Program Recommendations  AACE/ADA: New Consensus Statement on Inpatient Glycemic Control (2015)  Target Ranges:  Prepandial:   less than 140 mg/dL      [facility] postprandial:   less than 180 mg/dL (1-2 hours)      Critically ill patients:  140 - 180 mg/dL   Review of Glycemic Control  Results for Jordan Brewer, Jordan Brewer (MRN [id number]) as of 11/16/2014 12:06  Ref. Range 11/15/2014 12:03 11/15/2014 16:53 11/15/2014 21:19 11/16/2014 07:52 11/16/2014 11:35  Glucose-Capillary Latest Ref Range: 65-99 mg/dL 163 (H) 189 (H) 186 (H) 181 (H) 191 (H)   Diabetes history: Type 2,  A1C 10.4% Outpatient Diabetes medications: Humalog tid per sliding scale, Lantus 10 units qhs Current orders for Inpatient glycemic control: Lantus 12 units qhs, Novolog 0-9 units tid, Novolog 0-5 units qhs  Spoke with the patient at the bedside and she confirms that she typically takes 8-10 or 12 units of Novolog insulin tid- tells me blood sugars have been 200 to as high as 455mg /dl over the past three weeks.   Last A1Cs 05/05/14 =11.5%   12/28/13=9.1%  Inpatient Diabetes Program Recommendations: Blood sugars remain elevated despite current orders for Novolog insulin. Consider adding Novolog 3units tid with meals and continue current Novolog correction as ordered.   Gentry Fitz, RN, BA, MHA, CDE Diabetes Coordinator Inpatient Diabetes Program  [phone] (Team Pager) [phone] ([place]) 11/16/2014 12:08 PM

## 2014-11-16 NOTE — Progress Notes (Signed)
Patient is medically stable for D/C to [facility] today. Per [facility] liaison patient is going to room 154-A. RN will call report at [phone] and arrange EMS for transport. [facility] authorization has been received. Clinical Education officer, museum (CSW) sent D/C Summary, D/C packet and FL2 to [facility] via [facility]. EMS form and prescriptions are on chart. Patient is aware of above. CSW contacted patient's son Edd Arbour and made him aware of above. Please reconsult if future social work needs arise. CSW signing off.   Blima Rich, [place] [phone]

## 2014-11-20 DIAGNOSIS — K210 Gastro-esophageal reflux disease with esophagitis: Secondary | ICD-10-CM | POA: Diagnosis not present

## 2014-11-20 DIAGNOSIS — E119 Type 2 diabetes mellitus without complications: Secondary | ICD-10-CM | POA: Diagnosis not present

## 2014-11-20 DIAGNOSIS — E784 Other hyperlipidemia: Secondary | ICD-10-CM | POA: Diagnosis not present

## 2014-11-20 DIAGNOSIS — I10 Essential (primary) hypertension: Secondary | ICD-10-CM | POA: Diagnosis not present

## 2014-11-20 DIAGNOSIS — E271 Primary adrenocortical insufficiency: Secondary | ICD-10-CM | POA: Diagnosis not present

## 2014-11-29 DIAGNOSIS — I10 Essential (primary) hypertension: Secondary | ICD-10-CM | POA: Diagnosis not present

## 2014-11-29 DIAGNOSIS — E119 Type 2 diabetes mellitus without complications: Secondary | ICD-10-CM | POA: Diagnosis not present

## 2014-11-29 DIAGNOSIS — K210 Gastro-esophageal reflux disease with esophagitis: Secondary | ICD-10-CM | POA: Diagnosis not present

## 2014-11-29 DIAGNOSIS — E784 Other hyperlipidemia: Secondary | ICD-10-CM | POA: Diagnosis not present

## 2014-11-29 DIAGNOSIS — E271 Primary adrenocortical insufficiency: Secondary | ICD-10-CM | POA: Diagnosis not present

## 2014-11-29 DIAGNOSIS — N189 Chronic kidney disease, unspecified: Secondary | ICD-10-CM | POA: Diagnosis not present

## 2014-12-05 DIAGNOSIS — S329XXA Fracture of unspecified parts of lumbosacral spine and pelvis, initial encounter for closed fracture: Secondary | ICD-10-CM | POA: Diagnosis not present

## 2014-12-05 DIAGNOSIS — K210 Gastro-esophageal reflux disease with esophagitis: Secondary | ICD-10-CM | POA: Diagnosis not present

## 2014-12-05 DIAGNOSIS — F329 Major depressive disorder, single episode, unspecified: Secondary | ICD-10-CM | POA: Diagnosis not present

## 2014-12-05 DIAGNOSIS — E119 Type 2 diabetes mellitus without complications: Secondary | ICD-10-CM | POA: Diagnosis not present

## 2014-12-05 DIAGNOSIS — E784 Other hyperlipidemia: Secondary | ICD-10-CM | POA: Diagnosis not present

## 2014-12-05 DIAGNOSIS — N189 Chronic kidney disease, unspecified: Secondary | ICD-10-CM | POA: Diagnosis not present

## 2014-12-05 DIAGNOSIS — E271 Primary adrenocortical insufficiency: Secondary | ICD-10-CM | POA: Diagnosis not present

## 2014-12-05 DIAGNOSIS — F419 Anxiety disorder, unspecified: Secondary | ICD-10-CM | POA: Diagnosis not present

## 2014-12-14 DIAGNOSIS — E119 Type 2 diabetes mellitus without complications: Secondary | ICD-10-CM | POA: Diagnosis not present

## 2014-12-14 DIAGNOSIS — E271 Primary adrenocortical insufficiency: Secondary | ICD-10-CM | POA: Diagnosis not present

## 2014-12-14 DIAGNOSIS — E784 Other hyperlipidemia: Secondary | ICD-10-CM | POA: Diagnosis not present

## 2014-12-14 DIAGNOSIS — I10 Essential (primary) hypertension: Secondary | ICD-10-CM | POA: Diagnosis not present

## 2014-12-14 DIAGNOSIS — K219 Gastro-esophageal reflux disease without esophagitis: Secondary | ICD-10-CM | POA: Diagnosis not present

## 2014-12-14 DIAGNOSIS — F419 Anxiety disorder, unspecified: Secondary | ICD-10-CM | POA: Diagnosis not present

## 2014-12-21 DIAGNOSIS — E1140 Type 2 diabetes mellitus with diabetic neuropathy, unspecified: Secondary | ICD-10-CM | POA: Diagnosis not present

## 2014-12-21 DIAGNOSIS — M1991 Primary osteoarthritis, unspecified site: Secondary | ICD-10-CM | POA: Diagnosis not present

## 2014-12-21 DIAGNOSIS — S32591D Other specified fracture of right pubis, subsequent encounter for fracture with routine healing: Secondary | ICD-10-CM | POA: Diagnosis not present

## 2014-12-21 DIAGNOSIS — N189 Chronic kidney disease, unspecified: Secondary | ICD-10-CM | POA: Diagnosis not present

## 2014-12-21 DIAGNOSIS — G8929 Other chronic pain: Secondary | ICD-10-CM | POA: Diagnosis not present

## 2014-12-21 DIAGNOSIS — E1122 Type 2 diabetes mellitus with diabetic chronic kidney disease: Secondary | ICD-10-CM | POA: Diagnosis not present

## 2014-12-21 DIAGNOSIS — F3176 Bipolar disorder, in full remission, most recent episode depressed: Secondary | ICD-10-CM | POA: Diagnosis not present

## 2014-12-21 DIAGNOSIS — I129 Hypertensive chronic kidney disease with stage 1 through stage 4 chronic kidney disease, or unspecified chronic kidney disease: Secondary | ICD-10-CM | POA: Diagnosis not present

## 2014-12-21 DIAGNOSIS — D649 Anemia, unspecified: Secondary | ICD-10-CM | POA: Diagnosis not present

## 2014-12-22 DIAGNOSIS — D649 Anemia, unspecified: Secondary | ICD-10-CM | POA: Diagnosis not present

## 2014-12-22 DIAGNOSIS — E1122 Type 2 diabetes mellitus with diabetic chronic kidney disease: Secondary | ICD-10-CM | POA: Diagnosis not present

## 2014-12-22 DIAGNOSIS — M1991 Primary osteoarthritis, unspecified site: Secondary | ICD-10-CM | POA: Diagnosis not present

## 2014-12-22 DIAGNOSIS — E1140 Type 2 diabetes mellitus with diabetic neuropathy, unspecified: Secondary | ICD-10-CM | POA: Diagnosis not present

## 2014-12-22 DIAGNOSIS — S32591D Other specified fracture of right pubis, subsequent encounter for fracture with routine healing: Secondary | ICD-10-CM | POA: Diagnosis not present

## 2014-12-22 DIAGNOSIS — I129 Hypertensive chronic kidney disease with stage 1 through stage 4 chronic kidney disease, or unspecified chronic kidney disease: Secondary | ICD-10-CM | POA: Diagnosis not present

## 2014-12-22 DIAGNOSIS — F3176 Bipolar disorder, in full remission, most recent episode depressed: Secondary | ICD-10-CM | POA: Diagnosis not present

## 2014-12-22 DIAGNOSIS — N189 Chronic kidney disease, unspecified: Secondary | ICD-10-CM | POA: Diagnosis not present

## 2014-12-22 DIAGNOSIS — G8929 Other chronic pain: Secondary | ICD-10-CM | POA: Diagnosis not present

## 2014-12-27 DIAGNOSIS — D649 Anemia, unspecified: Secondary | ICD-10-CM | POA: Diagnosis not present

## 2014-12-27 DIAGNOSIS — M1991 Primary osteoarthritis, unspecified site: Secondary | ICD-10-CM | POA: Diagnosis not present

## 2014-12-27 DIAGNOSIS — Z8781 Personal history of (healed) traumatic fracture: Secondary | ICD-10-CM | POA: Diagnosis not present

## 2014-12-27 DIAGNOSIS — F3176 Bipolar disorder, in full remission, most recent episode depressed: Secondary | ICD-10-CM | POA: Diagnosis not present

## 2014-12-27 DIAGNOSIS — J069 Acute upper respiratory infection, unspecified: Secondary | ICD-10-CM | POA: Diagnosis not present

## 2014-12-27 DIAGNOSIS — G8929 Other chronic pain: Secondary | ICD-10-CM | POA: Diagnosis not present

## 2014-12-27 DIAGNOSIS — Z794 Long term (current) use of insulin: Secondary | ICD-10-CM | POA: Diagnosis not present

## 2014-12-27 DIAGNOSIS — E119 Type 2 diabetes mellitus without complications: Secondary | ICD-10-CM | POA: Diagnosis not present

## 2014-12-27 DIAGNOSIS — E1140 Type 2 diabetes mellitus with diabetic neuropathy, unspecified: Secondary | ICD-10-CM | POA: Diagnosis not present

## 2014-12-27 DIAGNOSIS — E1122 Type 2 diabetes mellitus with diabetic chronic kidney disease: Secondary | ICD-10-CM | POA: Diagnosis not present

## 2014-12-27 DIAGNOSIS — N189 Chronic kidney disease, unspecified: Secondary | ICD-10-CM | POA: Diagnosis not present

## 2014-12-27 DIAGNOSIS — S32591D Other specified fracture of right pubis, subsequent encounter for fracture with routine healing: Secondary | ICD-10-CM | POA: Diagnosis not present

## 2014-12-27 DIAGNOSIS — I129 Hypertensive chronic kidney disease with stage 1 through stage 4 chronic kidney disease, or unspecified chronic kidney disease: Secondary | ICD-10-CM | POA: Diagnosis not present

## 2014-12-28 DIAGNOSIS — N189 Chronic kidney disease, unspecified: Secondary | ICD-10-CM | POA: Diagnosis not present

## 2014-12-28 DIAGNOSIS — D649 Anemia, unspecified: Secondary | ICD-10-CM | POA: Diagnosis not present

## 2014-12-28 DIAGNOSIS — M1991 Primary osteoarthritis, unspecified site: Secondary | ICD-10-CM | POA: Diagnosis not present

## 2014-12-28 DIAGNOSIS — E1122 Type 2 diabetes mellitus with diabetic chronic kidney disease: Secondary | ICD-10-CM | POA: Diagnosis not present

## 2014-12-28 DIAGNOSIS — E1140 Type 2 diabetes mellitus with diabetic neuropathy, unspecified: Secondary | ICD-10-CM | POA: Diagnosis not present

## 2014-12-28 DIAGNOSIS — I129 Hypertensive chronic kidney disease with stage 1 through stage 4 chronic kidney disease, or unspecified chronic kidney disease: Secondary | ICD-10-CM | POA: Diagnosis not present

## 2014-12-28 DIAGNOSIS — G8929 Other chronic pain: Secondary | ICD-10-CM | POA: Diagnosis not present

## 2014-12-28 DIAGNOSIS — F3176 Bipolar disorder, in full remission, most recent episode depressed: Secondary | ICD-10-CM | POA: Diagnosis not present

## 2014-12-28 DIAGNOSIS — S32591D Other specified fracture of right pubis, subsequent encounter for fracture with routine healing: Secondary | ICD-10-CM | POA: Diagnosis not present

## 2014-12-29 DIAGNOSIS — E1122 Type 2 diabetes mellitus with diabetic chronic kidney disease: Secondary | ICD-10-CM | POA: Diagnosis not present

## 2014-12-29 DIAGNOSIS — F3176 Bipolar disorder, in full remission, most recent episode depressed: Secondary | ICD-10-CM | POA: Diagnosis not present

## 2014-12-29 DIAGNOSIS — E1140 Type 2 diabetes mellitus with diabetic neuropathy, unspecified: Secondary | ICD-10-CM | POA: Diagnosis not present

## 2014-12-29 DIAGNOSIS — N189 Chronic kidney disease, unspecified: Secondary | ICD-10-CM | POA: Diagnosis not present

## 2014-12-29 DIAGNOSIS — M1991 Primary osteoarthritis, unspecified site: Secondary | ICD-10-CM | POA: Diagnosis not present

## 2014-12-29 DIAGNOSIS — I129 Hypertensive chronic kidney disease with stage 1 through stage 4 chronic kidney disease, or unspecified chronic kidney disease: Secondary | ICD-10-CM | POA: Diagnosis not present

## 2014-12-29 DIAGNOSIS — G8929 Other chronic pain: Secondary | ICD-10-CM | POA: Diagnosis not present

## 2014-12-29 DIAGNOSIS — D649 Anemia, unspecified: Secondary | ICD-10-CM | POA: Diagnosis not present

## 2014-12-29 DIAGNOSIS — S32591D Other specified fracture of right pubis, subsequent encounter for fracture with routine healing: Secondary | ICD-10-CM | POA: Diagnosis not present

## 2014-12-31 DIAGNOSIS — F3176 Bipolar disorder, in full remission, most recent episode depressed: Secondary | ICD-10-CM | POA: Diagnosis not present

## 2014-12-31 DIAGNOSIS — S32591D Other specified fracture of right pubis, subsequent encounter for fracture with routine healing: Secondary | ICD-10-CM | POA: Diagnosis not present

## 2014-12-31 DIAGNOSIS — E1140 Type 2 diabetes mellitus with diabetic neuropathy, unspecified: Secondary | ICD-10-CM | POA: Diagnosis not present

## 2014-12-31 DIAGNOSIS — M1991 Primary osteoarthritis, unspecified site: Secondary | ICD-10-CM | POA: Diagnosis not present

## 2014-12-31 DIAGNOSIS — G8929 Other chronic pain: Secondary | ICD-10-CM | POA: Diagnosis not present

## 2014-12-31 DIAGNOSIS — E1122 Type 2 diabetes mellitus with diabetic chronic kidney disease: Secondary | ICD-10-CM | POA: Diagnosis not present

## 2014-12-31 DIAGNOSIS — N189 Chronic kidney disease, unspecified: Secondary | ICD-10-CM | POA: Diagnosis not present

## 2014-12-31 DIAGNOSIS — I129 Hypertensive chronic kidney disease with stage 1 through stage 4 chronic kidney disease, or unspecified chronic kidney disease: Secondary | ICD-10-CM | POA: Diagnosis not present

## 2014-12-31 DIAGNOSIS — D649 Anemia, unspecified: Secondary | ICD-10-CM | POA: Diagnosis not present

## 2015-01-04 DIAGNOSIS — I129 Hypertensive chronic kidney disease with stage 1 through stage 4 chronic kidney disease, or unspecified chronic kidney disease: Secondary | ICD-10-CM | POA: Diagnosis not present

## 2015-01-04 DIAGNOSIS — G8929 Other chronic pain: Secondary | ICD-10-CM | POA: Diagnosis not present

## 2015-01-04 DIAGNOSIS — M1991 Primary osteoarthritis, unspecified site: Secondary | ICD-10-CM | POA: Diagnosis not present

## 2015-01-04 DIAGNOSIS — F3176 Bipolar disorder, in full remission, most recent episode depressed: Secondary | ICD-10-CM | POA: Diagnosis not present

## 2015-01-04 DIAGNOSIS — E1140 Type 2 diabetes mellitus with diabetic neuropathy, unspecified: Secondary | ICD-10-CM | POA: Diagnosis not present

## 2015-01-04 DIAGNOSIS — N189 Chronic kidney disease, unspecified: Secondary | ICD-10-CM | POA: Diagnosis not present

## 2015-01-04 DIAGNOSIS — D649 Anemia, unspecified: Secondary | ICD-10-CM | POA: Diagnosis not present

## 2015-01-04 DIAGNOSIS — S32591D Other specified fracture of right pubis, subsequent encounter for fracture with routine healing: Secondary | ICD-10-CM | POA: Diagnosis not present

## 2015-01-04 DIAGNOSIS — E1122 Type 2 diabetes mellitus with diabetic chronic kidney disease: Secondary | ICD-10-CM | POA: Diagnosis not present

## 2015-01-05 DIAGNOSIS — E1122 Type 2 diabetes mellitus with diabetic chronic kidney disease: Secondary | ICD-10-CM | POA: Diagnosis not present

## 2015-01-05 DIAGNOSIS — G8929 Other chronic pain: Secondary | ICD-10-CM | POA: Diagnosis not present

## 2015-01-05 DIAGNOSIS — F3176 Bipolar disorder, in full remission, most recent episode depressed: Secondary | ICD-10-CM | POA: Diagnosis not present

## 2015-01-05 DIAGNOSIS — E1140 Type 2 diabetes mellitus with diabetic neuropathy, unspecified: Secondary | ICD-10-CM | POA: Diagnosis not present

## 2015-01-05 DIAGNOSIS — I129 Hypertensive chronic kidney disease with stage 1 through stage 4 chronic kidney disease, or unspecified chronic kidney disease: Secondary | ICD-10-CM | POA: Diagnosis not present

## 2015-01-05 DIAGNOSIS — N189 Chronic kidney disease, unspecified: Secondary | ICD-10-CM | POA: Diagnosis not present

## 2015-01-05 DIAGNOSIS — S32591D Other specified fracture of right pubis, subsequent encounter for fracture with routine healing: Secondary | ICD-10-CM | POA: Diagnosis not present

## 2015-01-05 DIAGNOSIS — D649 Anemia, unspecified: Secondary | ICD-10-CM | POA: Diagnosis not present

## 2015-01-05 DIAGNOSIS — M1991 Primary osteoarthritis, unspecified site: Secondary | ICD-10-CM | POA: Diagnosis not present

## 2015-01-10 DIAGNOSIS — E1140 Type 2 diabetes mellitus with diabetic neuropathy, unspecified: Secondary | ICD-10-CM | POA: Diagnosis not present

## 2015-01-10 DIAGNOSIS — E1122 Type 2 diabetes mellitus with diabetic chronic kidney disease: Secondary | ICD-10-CM | POA: Diagnosis not present

## 2015-01-10 DIAGNOSIS — I129 Hypertensive chronic kidney disease with stage 1 through stage 4 chronic kidney disease, or unspecified chronic kidney disease: Secondary | ICD-10-CM | POA: Diagnosis not present

## 2015-01-10 DIAGNOSIS — F3176 Bipolar disorder, in full remission, most recent episode depressed: Secondary | ICD-10-CM | POA: Diagnosis not present

## 2015-01-10 DIAGNOSIS — M1991 Primary osteoarthritis, unspecified site: Secondary | ICD-10-CM | POA: Diagnosis not present

## 2015-01-10 DIAGNOSIS — S32591D Other specified fracture of right pubis, subsequent encounter for fracture with routine healing: Secondary | ICD-10-CM | POA: Diagnosis not present

## 2015-01-10 DIAGNOSIS — N189 Chronic kidney disease, unspecified: Secondary | ICD-10-CM | POA: Diagnosis not present

## 2015-01-10 DIAGNOSIS — D649 Anemia, unspecified: Secondary | ICD-10-CM | POA: Diagnosis not present

## 2015-01-10 DIAGNOSIS — G8929 Other chronic pain: Secondary | ICD-10-CM | POA: Diagnosis not present

## 2015-01-12 DIAGNOSIS — Z8781 Personal history of (healed) traumatic fracture: Secondary | ICD-10-CM | POA: Diagnosis not present

## 2015-01-12 DIAGNOSIS — E119 Type 2 diabetes mellitus without complications: Secondary | ICD-10-CM | POA: Diagnosis not present

## 2015-01-12 DIAGNOSIS — Z794 Long term (current) use of insulin: Secondary | ICD-10-CM | POA: Diagnosis not present

## 2015-01-12 DIAGNOSIS — R102 Pelvic and perineal pain: Secondary | ICD-10-CM | POA: Diagnosis not present

## 2015-01-12 DIAGNOSIS — I10 Essential (primary) hypertension: Secondary | ICD-10-CM | POA: Diagnosis not present

## 2015-01-17 DIAGNOSIS — N189 Chronic kidney disease, unspecified: Secondary | ICD-10-CM | POA: Diagnosis not present

## 2015-01-17 DIAGNOSIS — M1991 Primary osteoarthritis, unspecified site: Secondary | ICD-10-CM | POA: Diagnosis not present

## 2015-01-17 DIAGNOSIS — I129 Hypertensive chronic kidney disease with stage 1 through stage 4 chronic kidney disease, or unspecified chronic kidney disease: Secondary | ICD-10-CM | POA: Diagnosis not present

## 2015-01-17 DIAGNOSIS — G8929 Other chronic pain: Secondary | ICD-10-CM | POA: Diagnosis not present

## 2015-01-17 DIAGNOSIS — D649 Anemia, unspecified: Secondary | ICD-10-CM | POA: Diagnosis not present

## 2015-01-17 DIAGNOSIS — F3176 Bipolar disorder, in full remission, most recent episode depressed: Secondary | ICD-10-CM | POA: Diagnosis not present

## 2015-01-17 DIAGNOSIS — S32591D Other specified fracture of right pubis, subsequent encounter for fracture with routine healing: Secondary | ICD-10-CM | POA: Diagnosis not present

## 2015-01-17 DIAGNOSIS — E1122 Type 2 diabetes mellitus with diabetic chronic kidney disease: Secondary | ICD-10-CM | POA: Diagnosis not present

## 2015-01-17 DIAGNOSIS — E1140 Type 2 diabetes mellitus with diabetic neuropathy, unspecified: Secondary | ICD-10-CM | POA: Diagnosis not present

## 2015-01-25 DIAGNOSIS — G8929 Other chronic pain: Secondary | ICD-10-CM | POA: Diagnosis not present

## 2015-01-25 DIAGNOSIS — F3176 Bipolar disorder, in full remission, most recent episode depressed: Secondary | ICD-10-CM | POA: Diagnosis not present

## 2015-01-25 DIAGNOSIS — S32591D Other specified fracture of right pubis, subsequent encounter for fracture with routine healing: Secondary | ICD-10-CM | POA: Diagnosis not present

## 2015-01-25 DIAGNOSIS — E1140 Type 2 diabetes mellitus with diabetic neuropathy, unspecified: Secondary | ICD-10-CM | POA: Diagnosis not present

## 2015-01-25 DIAGNOSIS — M1991 Primary osteoarthritis, unspecified site: Secondary | ICD-10-CM | POA: Diagnosis not present

## 2015-01-25 DIAGNOSIS — D649 Anemia, unspecified: Secondary | ICD-10-CM | POA: Diagnosis not present

## 2015-01-25 DIAGNOSIS — E1122 Type 2 diabetes mellitus with diabetic chronic kidney disease: Secondary | ICD-10-CM | POA: Diagnosis not present

## 2015-01-25 DIAGNOSIS — I129 Hypertensive chronic kidney disease with stage 1 through stage 4 chronic kidney disease, or unspecified chronic kidney disease: Secondary | ICD-10-CM | POA: Diagnosis not present

## 2015-01-25 DIAGNOSIS — N189 Chronic kidney disease, unspecified: Secondary | ICD-10-CM | POA: Diagnosis not present

## 2015-01-26 DIAGNOSIS — M25552 Pain in left hip: Secondary | ICD-10-CM | POA: Diagnosis not present

## 2015-01-26 DIAGNOSIS — Z96642 Presence of left artificial hip joint: Secondary | ICD-10-CM | POA: Diagnosis not present

## 2015-01-27 ENCOUNTER — Other Ambulatory Visit: Payer: Self-pay | Admitting: Orthopedic Surgery

## 2015-01-27 DIAGNOSIS — S32591D Other specified fracture of right pubis, subsequent encounter for fracture with routine healing: Secondary | ICD-10-CM | POA: Diagnosis not present

## 2015-01-27 DIAGNOSIS — N189 Chronic kidney disease, unspecified: Secondary | ICD-10-CM | POA: Diagnosis not present

## 2015-01-27 DIAGNOSIS — E1122 Type 2 diabetes mellitus with diabetic chronic kidney disease: Secondary | ICD-10-CM | POA: Diagnosis not present

## 2015-01-27 DIAGNOSIS — F3176 Bipolar disorder, in full remission, most recent episode depressed: Secondary | ICD-10-CM | POA: Diagnosis not present

## 2015-01-27 DIAGNOSIS — M1991 Primary osteoarthritis, unspecified site: Secondary | ICD-10-CM | POA: Diagnosis not present

## 2015-01-27 DIAGNOSIS — E1140 Type 2 diabetes mellitus with diabetic neuropathy, unspecified: Secondary | ICD-10-CM | POA: Diagnosis not present

## 2015-01-27 DIAGNOSIS — D649 Anemia, unspecified: Secondary | ICD-10-CM | POA: Diagnosis not present

## 2015-01-27 DIAGNOSIS — Z96642 Presence of left artificial hip joint: Secondary | ICD-10-CM

## 2015-01-27 DIAGNOSIS — G8929 Other chronic pain: Secondary | ICD-10-CM | POA: Diagnosis not present

## 2015-01-27 DIAGNOSIS — I129 Hypertensive chronic kidney disease with stage 1 through stage 4 chronic kidney disease, or unspecified chronic kidney disease: Secondary | ICD-10-CM | POA: Diagnosis not present

## 2015-02-01 ENCOUNTER — Encounter: Admission: RE | Admit: 2015-02-01 | Payer: Medicare HMO | Source: Ambulatory Visit

## 2015-02-11 DIAGNOSIS — S32591D Other specified fracture of right pubis, subsequent encounter for fracture with routine healing: Secondary | ICD-10-CM | POA: Diagnosis not present

## 2015-02-11 DIAGNOSIS — F3176 Bipolar disorder, in full remission, most recent episode depressed: Secondary | ICD-10-CM | POA: Diagnosis not present

## 2015-02-11 DIAGNOSIS — N189 Chronic kidney disease, unspecified: Secondary | ICD-10-CM | POA: Diagnosis not present

## 2015-02-11 DIAGNOSIS — D649 Anemia, unspecified: Secondary | ICD-10-CM | POA: Diagnosis not present

## 2015-02-11 DIAGNOSIS — M1991 Primary osteoarthritis, unspecified site: Secondary | ICD-10-CM | POA: Diagnosis not present

## 2015-02-11 DIAGNOSIS — E1140 Type 2 diabetes mellitus with diabetic neuropathy, unspecified: Secondary | ICD-10-CM | POA: Diagnosis not present

## 2015-02-11 DIAGNOSIS — G8929 Other chronic pain: Secondary | ICD-10-CM | POA: Diagnosis not present

## 2015-02-11 DIAGNOSIS — I129 Hypertensive chronic kidney disease with stage 1 through stage 4 chronic kidney disease, or unspecified chronic kidney disease: Secondary | ICD-10-CM | POA: Diagnosis not present

## 2015-02-11 DIAGNOSIS — E1122 Type 2 diabetes mellitus with diabetic chronic kidney disease: Secondary | ICD-10-CM | POA: Diagnosis not present

## 2015-02-15 DIAGNOSIS — Z794 Long term (current) use of insulin: Secondary | ICD-10-CM | POA: Diagnosis not present

## 2015-02-15 DIAGNOSIS — G894 Chronic pain syndrome: Secondary | ICD-10-CM | POA: Diagnosis not present

## 2015-02-15 DIAGNOSIS — M1711 Unilateral primary osteoarthritis, right knee: Secondary | ICD-10-CM | POA: Diagnosis not present

## 2015-02-15 DIAGNOSIS — F329 Major depressive disorder, single episode, unspecified: Secondary | ICD-10-CM | POA: Diagnosis not present

## 2015-02-15 DIAGNOSIS — I10 Essential (primary) hypertension: Secondary | ICD-10-CM | POA: Diagnosis not present

## 2015-02-15 DIAGNOSIS — E119 Type 2 diabetes mellitus without complications: Secondary | ICD-10-CM | POA: Diagnosis not present

## 2015-02-16 DIAGNOSIS — F329 Major depressive disorder, single episode, unspecified: Secondary | ICD-10-CM | POA: Diagnosis not present

## 2015-02-16 DIAGNOSIS — M1711 Unilateral primary osteoarthritis, right knee: Secondary | ICD-10-CM | POA: Diagnosis not present

## 2015-02-16 DIAGNOSIS — I10 Essential (primary) hypertension: Secondary | ICD-10-CM | POA: Diagnosis not present

## 2015-02-16 DIAGNOSIS — Z794 Long term (current) use of insulin: Secondary | ICD-10-CM | POA: Diagnosis not present

## 2015-02-16 DIAGNOSIS — E119 Type 2 diabetes mellitus without complications: Secondary | ICD-10-CM | POA: Diagnosis not present

## 2015-02-16 DIAGNOSIS — G894 Chronic pain syndrome: Secondary | ICD-10-CM | POA: Diagnosis not present

## 2015-02-21 DIAGNOSIS — F329 Major depressive disorder, single episode, unspecified: Secondary | ICD-10-CM | POA: Diagnosis not present

## 2015-02-21 DIAGNOSIS — F419 Anxiety disorder, unspecified: Secondary | ICD-10-CM | POA: Diagnosis not present

## 2015-02-21 DIAGNOSIS — E1165 Type 2 diabetes mellitus with hyperglycemia: Secondary | ICD-10-CM | POA: Diagnosis not present

## 2015-02-21 DIAGNOSIS — M25511 Pain in right shoulder: Secondary | ICD-10-CM | POA: Diagnosis not present

## 2015-02-21 DIAGNOSIS — Z794 Long term (current) use of insulin: Secondary | ICD-10-CM | POA: Diagnosis not present

## 2015-02-21 DIAGNOSIS — I10 Essential (primary) hypertension: Secondary | ICD-10-CM | POA: Diagnosis not present

## 2015-02-21 DIAGNOSIS — N183 Chronic kidney disease, stage 3 (moderate): Secondary | ICD-10-CM | POA: Diagnosis not present

## 2015-02-21 DIAGNOSIS — G8929 Other chronic pain: Secondary | ICD-10-CM | POA: Diagnosis not present

## 2015-02-21 DIAGNOSIS — G894 Chronic pain syndrome: Secondary | ICD-10-CM | POA: Diagnosis not present

## 2015-05-16 DIAGNOSIS — E1165 Type 2 diabetes mellitus with hyperglycemia: Secondary | ICD-10-CM | POA: Diagnosis not present

## 2015-05-16 DIAGNOSIS — G894 Chronic pain syndrome: Secondary | ICD-10-CM | POA: Diagnosis not present

## 2015-05-16 DIAGNOSIS — G8929 Other chronic pain: Secondary | ICD-10-CM | POA: Diagnosis not present

## 2015-05-16 DIAGNOSIS — F419 Anxiety disorder, unspecified: Secondary | ICD-10-CM | POA: Diagnosis not present

## 2015-05-16 DIAGNOSIS — M25511 Pain in right shoulder: Secondary | ICD-10-CM | POA: Diagnosis not present

## 2015-05-16 DIAGNOSIS — I10 Essential (primary) hypertension: Secondary | ICD-10-CM | POA: Diagnosis not present

## 2015-05-16 DIAGNOSIS — Z794 Long term (current) use of insulin: Secondary | ICD-10-CM | POA: Diagnosis not present

## 2015-05-23 DIAGNOSIS — E119 Type 2 diabetes mellitus without complications: Secondary | ICD-10-CM | POA: Diagnosis not present

## 2015-05-23 DIAGNOSIS — G894 Chronic pain syndrome: Secondary | ICD-10-CM | POA: Diagnosis not present

## 2015-05-23 DIAGNOSIS — I10 Essential (primary) hypertension: Secondary | ICD-10-CM | POA: Diagnosis not present

## 2015-05-23 DIAGNOSIS — F419 Anxiety disorder, unspecified: Secondary | ICD-10-CM | POA: Diagnosis not present

## 2015-05-23 DIAGNOSIS — F329 Major depressive disorder, single episode, unspecified: Secondary | ICD-10-CM | POA: Diagnosis not present

## 2015-05-23 DIAGNOSIS — G8929 Other chronic pain: Secondary | ICD-10-CM | POA: Diagnosis not present

## 2015-05-23 DIAGNOSIS — Z794 Long term (current) use of insulin: Secondary | ICD-10-CM | POA: Diagnosis not present

## 2015-05-23 DIAGNOSIS — M25511 Pain in right shoulder: Secondary | ICD-10-CM | POA: Diagnosis not present

## 2015-10-05 DIAGNOSIS — Z794 Long term (current) use of insulin: Secondary | ICD-10-CM | POA: Diagnosis not present

## 2015-10-05 DIAGNOSIS — E119 Type 2 diabetes mellitus without complications: Secondary | ICD-10-CM | POA: Diagnosis not present

## 2015-10-05 DIAGNOSIS — G8929 Other chronic pain: Secondary | ICD-10-CM | POA: Diagnosis not present

## 2015-10-05 DIAGNOSIS — G894 Chronic pain syndrome: Secondary | ICD-10-CM | POA: Diagnosis not present

## 2015-10-05 DIAGNOSIS — F419 Anxiety disorder, unspecified: Secondary | ICD-10-CM | POA: Diagnosis not present

## 2015-10-05 DIAGNOSIS — I10 Essential (primary) hypertension: Secondary | ICD-10-CM | POA: Diagnosis not present

## 2015-10-05 DIAGNOSIS — F329 Major depressive disorder, single episode, unspecified: Secondary | ICD-10-CM | POA: Diagnosis not present

## 2015-10-05 DIAGNOSIS — M25511 Pain in right shoulder: Secondary | ICD-10-CM | POA: Diagnosis not present

## 2015-10-07 DIAGNOSIS — E119 Type 2 diabetes mellitus without complications: Secondary | ICD-10-CM | POA: Diagnosis not present

## 2015-10-07 DIAGNOSIS — I10 Essential (primary) hypertension: Secondary | ICD-10-CM | POA: Diagnosis not present

## 2015-10-07 DIAGNOSIS — F329 Major depressive disorder, single episode, unspecified: Secondary | ICD-10-CM | POA: Diagnosis not present

## 2015-10-07 DIAGNOSIS — G894 Chronic pain syndrome: Secondary | ICD-10-CM | POA: Diagnosis not present

## 2015-10-07 DIAGNOSIS — F419 Anxiety disorder, unspecified: Secondary | ICD-10-CM | POA: Diagnosis not present

## 2015-10-07 DIAGNOSIS — Z794 Long term (current) use of insulin: Secondary | ICD-10-CM | POA: Diagnosis not present

## 2015-10-07 DIAGNOSIS — M25511 Pain in right shoulder: Secondary | ICD-10-CM | POA: Diagnosis not present

## 2015-10-07 DIAGNOSIS — G8929 Other chronic pain: Secondary | ICD-10-CM | POA: Diagnosis not present

## 2015-10-11 DIAGNOSIS — L989 Disorder of the skin and subcutaneous tissue, unspecified: Secondary | ICD-10-CM | POA: Diagnosis not present

## 2015-10-11 DIAGNOSIS — Z0000 Encounter for general adult medical examination without abnormal findings: Secondary | ICD-10-CM | POA: Diagnosis not present

## 2015-10-11 DIAGNOSIS — I10 Essential (primary) hypertension: Secondary | ICD-10-CM | POA: Diagnosis not present

## 2015-10-11 DIAGNOSIS — F329 Major depressive disorder, single episode, unspecified: Secondary | ICD-10-CM | POA: Diagnosis not present

## 2015-10-11 DIAGNOSIS — L570 Actinic keratosis: Secondary | ICD-10-CM | POA: Diagnosis not present

## 2015-10-11 DIAGNOSIS — E119 Type 2 diabetes mellitus without complications: Secondary | ICD-10-CM | POA: Diagnosis not present

## 2015-10-11 DIAGNOSIS — M25511 Pain in right shoulder: Secondary | ICD-10-CM | POA: Diagnosis not present

## 2015-10-11 DIAGNOSIS — G8929 Other chronic pain: Secondary | ICD-10-CM | POA: Diagnosis not present

## 2015-10-11 DIAGNOSIS — Z794 Long term (current) use of insulin: Secondary | ICD-10-CM | POA: Diagnosis not present

## 2015-10-11 DIAGNOSIS — F419 Anxiety disorder, unspecified: Secondary | ICD-10-CM | POA: Diagnosis not present

## 2015-10-17 DIAGNOSIS — L821 Other seborrheic keratosis: Secondary | ICD-10-CM | POA: Diagnosis not present

## 2015-10-17 DIAGNOSIS — D485 Neoplasm of uncertain behavior of skin: Secondary | ICD-10-CM | POA: Diagnosis not present

## 2015-10-17 DIAGNOSIS — L570 Actinic keratosis: Secondary | ICD-10-CM | POA: Diagnosis not present

## 2015-10-17 DIAGNOSIS — C44519 Basal cell carcinoma of skin of other part of trunk: Secondary | ICD-10-CM | POA: Diagnosis not present

## 2015-10-17 DIAGNOSIS — D0462 Carcinoma in situ of skin of left upper limb, including shoulder: Secondary | ICD-10-CM | POA: Diagnosis not present

## 2015-10-17 DIAGNOSIS — X32XXXA Exposure to sunlight, initial encounter: Secondary | ICD-10-CM | POA: Diagnosis not present

## 2015-10-17 DIAGNOSIS — D0471 Carcinoma in situ of skin of right lower limb, including hip: Secondary | ICD-10-CM | POA: Diagnosis not present

## 2015-10-30 ENCOUNTER — Encounter: Payer: Self-pay | Admitting: Emergency Medicine

## 2015-10-30 ENCOUNTER — Inpatient Hospital Stay
Admission: EM | Admit: 2015-10-30 | Discharge: 2015-11-02 | DRG: 872 | Disposition: A | Discharge status: Home / Self Care | Payer: Commercial Managed Care - HMO | Attending: Internal Medicine | Admitting: Internal Medicine

## 2015-10-30 ENCOUNTER — Emergency Department: Payer: Commercial Managed Care - HMO

## 2015-10-30 DIAGNOSIS — F319 Bipolar disorder, unspecified: Secondary | ICD-10-CM | POA: Diagnosis not present

## 2015-10-30 DIAGNOSIS — Z7952 Long term (current) use of systemic steroids: Secondary | ICD-10-CM | POA: Diagnosis not present

## 2015-10-30 DIAGNOSIS — R1032 Left lower quadrant pain: Secondary | ICD-10-CM | POA: Diagnosis not present

## 2015-10-30 DIAGNOSIS — E119 Type 2 diabetes mellitus without complications: Secondary | ICD-10-CM | POA: Diagnosis not present

## 2015-10-30 DIAGNOSIS — E1122 Type 2 diabetes mellitus with diabetic chronic kidney disease: Secondary | ICD-10-CM | POA: Diagnosis present

## 2015-10-30 DIAGNOSIS — Z96642 Presence of left artificial hip joint: Secondary | ICD-10-CM | POA: Diagnosis present

## 2015-10-30 DIAGNOSIS — Z90710 Acquired absence of both cervix and uterus: Secondary | ICD-10-CM | POA: Diagnosis not present

## 2015-10-30 DIAGNOSIS — Z9049 Acquired absence of other specified parts of digestive tract: Secondary | ICD-10-CM | POA: Diagnosis not present

## 2015-10-30 DIAGNOSIS — N189 Chronic kidney disease, unspecified: Secondary | ICD-10-CM | POA: Diagnosis present

## 2015-10-30 DIAGNOSIS — Z888 Allergy status to other drugs, medicaments and biological substances status: Secondary | ICD-10-CM | POA: Diagnosis not present

## 2015-10-30 DIAGNOSIS — R262 Difficulty in walking, not elsewhere classified: Secondary | ICD-10-CM

## 2015-10-30 DIAGNOSIS — R140 Abdominal distension (gaseous): Secondary | ICD-10-CM

## 2015-10-30 DIAGNOSIS — E7800 Pure hypercholesterolemia, unspecified: Secondary | ICD-10-CM | POA: Diagnosis not present

## 2015-10-30 DIAGNOSIS — Z23 Encounter for immunization: Secondary | ICD-10-CM | POA: Diagnosis not present

## 2015-10-30 DIAGNOSIS — Z885 Allergy status to narcotic agent status: Secondary | ICD-10-CM

## 2015-10-30 DIAGNOSIS — A419 Sepsis, unspecified organism: Principal | ICD-10-CM | POA: Diagnosis present

## 2015-10-30 DIAGNOSIS — R55 Syncope and collapse: Secondary | ICD-10-CM

## 2015-10-30 DIAGNOSIS — G8929 Other chronic pain: Secondary | ICD-10-CM | POA: Diagnosis present

## 2015-10-30 DIAGNOSIS — K922 Gastrointestinal hemorrhage, unspecified: Secondary | ICD-10-CM

## 2015-10-30 DIAGNOSIS — I129 Hypertensive chronic kidney disease with stage 1 through stage 4 chronic kidney disease, or unspecified chronic kidney disease: Secondary | ICD-10-CM | POA: Diagnosis present

## 2015-10-30 DIAGNOSIS — K449 Diaphragmatic hernia without obstruction or gangrene: Secondary | ICD-10-CM | POA: Diagnosis present

## 2015-10-30 DIAGNOSIS — M549 Dorsalgia, unspecified: Secondary | ICD-10-CM | POA: Diagnosis present

## 2015-10-30 DIAGNOSIS — Z86718 Personal history of other venous thrombosis and embolism: Secondary | ICD-10-CM

## 2015-10-30 DIAGNOSIS — K529 Noninfective gastroenteritis and colitis, unspecified: Secondary | ICD-10-CM | POA: Diagnosis not present

## 2015-10-30 DIAGNOSIS — E271 Primary adrenocortical insufficiency: Secondary | ICD-10-CM | POA: Diagnosis present

## 2015-10-30 DIAGNOSIS — Z794 Long term (current) use of insulin: Secondary | ICD-10-CM

## 2015-10-30 DIAGNOSIS — E785 Hyperlipidemia, unspecified: Secondary | ICD-10-CM | POA: Diagnosis not present

## 2015-10-30 DIAGNOSIS — K219 Gastro-esophageal reflux disease without esophagitis: Secondary | ICD-10-CM | POA: Diagnosis present

## 2015-10-30 HISTORY — DX: Malignant (primary) neoplasm, unspecified: C80.1

## 2015-10-30 LAB — CBC
HCT: 42.3 % (ref 35.0–47.0)
HEMATOCRIT: 35.3 % (ref 35.0–47.0)
HEMATOCRIT: 34.1 % — AB (ref 35.0–47.0)
HEMOGLOBIN: 11.5 g/dL — AB (ref 12.0–16.0)
HEMOGLOBIN: 10.9 g/dL — AB (ref 12.0–16.0)
Hemoglobin: 13.2 g/dL (ref 12.0–16.0)
MCH: 25.5 pg — ABNORMAL LOW (ref 26.0–34.0)
MCH: 26.1 pg (ref 26.0–34.0)
MCH: 25.6 pg — AB (ref 26.0–34.0)
MCHC: 31.3 g/dL — AB (ref 32.0–36.0)
MCHC: 32.5 g/dL (ref 32.0–36.0)
MCHC: 32 g/dL (ref 32.0–36.0)
MCV: 81.6 fL (ref 80.0–100.0)
MCV: 80.3 fL (ref 80.0–100.0)
MCV: 80.2 fL (ref 80.0–100.0)
PLATELETS: 384 10*3/uL (ref 150–440)
Platelets: 287 10*3/uL (ref 150–440)
Platelets: 289 10*3/uL (ref 150–440)
RBC: 5.19 MIL/uL (ref 3.80–5.20)
RBC: 4.4 MIL/uL (ref 3.80–5.20)
RBC: 4.25 MIL/uL (ref 3.80–5.20)
RDW: 18.1 % — AB (ref 11.5–14.5)
RDW: 17.8 % — ABNORMAL HIGH (ref 11.5–14.5)
RDW: 18.1 % — ABNORMAL HIGH (ref 11.5–14.5)
WBC: 16.4 10*3/uL — AB (ref 3.6–11.0)
WBC: 12.1 10*3/uL — AB (ref 3.6–11.0)
WBC: 12.1 10*3/uL — ABNORMAL HIGH (ref 3.6–11.0)

## 2015-10-30 LAB — COMPREHENSIVE METABOLIC PANEL
ALT: 12 U/L — AB (ref 14–54)
AST: 19 U/L (ref 15–41)
Albumin: 4 g/dL (ref 3.5–5.0)
Alkaline Phosphatase: 84 U/L (ref 38–126)
Anion gap: 12 (ref 5–15)
BILIRUBIN TOTAL: 0.6 mg/dL (ref 0.3–1.2)
BUN: 18 mg/dL (ref 6–20)
CHLORIDE: 104 mmol/L (ref 101–111)
CO2: 23 mmol/L (ref 22–32)
CREATININE: 1.22 mg/dL — AB (ref 0.44–1.00)
Calcium: 9.7 mg/dL (ref 8.9–10.3)
GFR calc Af Amer: 50 mL/min — ABNORMAL LOW (ref >60)
GFR, EST NON AFRICAN AMERICAN: 43 mL/min — AB (ref >60)
GLUCOSE: 231 mg/dL — AB (ref 65–99)
Potassium: 3.4 mmol/L — ABNORMAL LOW (ref 3.5–5.1)
Sodium: 139 mmol/L (ref 135–145)
Total Protein: 7.5 g/dL (ref 6.5–8.1)

## 2015-10-30 LAB — GLUCOSE, CAPILLARY
Glucose-Capillary: 183 mg/dL — ABNORMAL HIGH (ref 65–99)
Glucose-Capillary: 175 mg/dL — ABNORMAL HIGH (ref 65–99)

## 2015-10-30 LAB — TYPE AND SCREEN
ABO/RH(D): O POS
Antibody Screen: NEGATIVE

## 2015-10-30 LAB — TROPONIN I: Troponin I: <0.03 ng/mL (ref <0.03)

## 2015-10-30 LAB — LIPASE, BLOOD: Lipase: 27 U/L (ref 11–51)

## 2015-10-30 MED ORDER — CIPROFLOXACIN IN D5W 400 MG/200ML IV SOLN
400.0000 mg | Freq: Two times a day (BID) | INTRAVENOUS | Status: DC
  Administered 2015-10-30 – 2015-10-31 (×2): 400 mg via INTRAVENOUS
  Filled 2015-10-30 (×3): qty 200

## 2015-10-30 MED ORDER — METRONIDAZOLE IN NACL 5-0.79 MG/ML-% IV SOLN
500.0000 mg | Freq: Three times a day (TID) | INTRAVENOUS | Status: DC
Start: 2015-10-30 — End: 2015-10-31
  Administered 2015-10-31 (×2): 500 mg via INTRAVENOUS
  Filled 2015-10-30 (×5): qty 100

## 2015-10-30 MED ORDER — POLYETHYL GLYCOL-PROPYL GLYCOL 0.4-0.3 % OP GEL: Freq: Every day | OPHTHALMIC | Status: DC | PRN

## 2015-10-30 MED ORDER — SODIUM CHLORIDE 0.9 % IV BOLUS (SEPSIS)
1000.0000 mL | Freq: Once | INTRAVENOUS | Status: AC
  Administered 2015-10-30: 1000 mL via INTRAVENOUS

## 2015-10-30 MED ORDER — AMLODIPINE BESYLATE 10 MG PO TABS
10.0000 mg | ORAL_TABLET | Freq: Every day | ORAL | Status: DC
  Administered 2015-10-30 – 2015-11-02 (×3): 10 mg via ORAL
  Filled 2015-10-30 (×4): qty 1

## 2015-10-30 MED ORDER — MORPHINE SULFATE (PF) 2 MG/ML IV SOLN
4.0000 mg | Freq: Once | INTRAVENOUS | Status: AC
  Administered 2015-10-30: 4 mg via INTRAVENOUS
  Filled 2015-10-30: qty 2

## 2015-10-30 MED ORDER — LISINOPRIL 20 MG PO TABS
20.0000 mg | ORAL_TABLET | Freq: Two times a day (BID) | ORAL | Status: DC
  Administered 2015-10-30 – 2015-11-02 (×5): 20 mg via ORAL
  Filled 2015-10-30 (×6): qty 1

## 2015-10-30 MED ORDER — OXYCODONE HCL 5 MG PO TABS
5.0000 mg | ORAL_TABLET | ORAL | Status: DC | PRN
  Administered 2015-10-30 – 2015-11-01 (×8): 5 mg via ORAL
  Filled 2015-10-30 (×8): qty 1

## 2015-10-30 MED ORDER — DULOXETINE HCL 60 MG PO CPEP
60.0000 mg | ORAL_CAPSULE | Freq: Every day | ORAL | Status: DC
  Administered 2015-10-30 – 2015-11-02 (×4): 60 mg via ORAL
  Filled 2015-10-30 (×4): qty 1

## 2015-10-30 MED ORDER — CIPROFLOXACIN IN D5W 400 MG/200ML IV SOLN
400.0000 mg | Freq: Once | INTRAVENOUS | Status: AC
  Administered 2015-10-30: 400 mg via INTRAVENOUS
  Filled 2015-10-30: qty 200

## 2015-10-30 MED ORDER — POLYVINYL ALCOHOL 1.4 % OP SOLN
1.0000 [drp] | OPHTHALMIC | Status: DC | PRN
  Filled 2015-10-30: qty 15

## 2015-10-30 MED ORDER — HYDROCORTISONE 10 MG PO TABS
20.0000 mg | ORAL_TABLET | Freq: Every morning | ORAL | Status: DC
  Administered 2015-10-31 – 2015-11-02 (×3): 20 mg via ORAL
  Filled 2015-10-30: qty 1
  Filled 2015-10-30 (×3): qty 2

## 2015-10-30 MED ORDER — IOPAMIDOL (ISOVUE-300) INJECTION 61%
75.0000 mL | Freq: Once | INTRAVENOUS | Status: AC | PRN
  Administered 2015-10-30: 75 mL via INTRAVENOUS

## 2015-10-30 MED ORDER — ACETAMINOPHEN 650 MG RE SUPP: 650.0000 mg | Freq: Four times a day (QID) | RECTAL | Status: DC | PRN

## 2015-10-30 MED ORDER — HYDROCORTISONE 10 MG PO TABS: 10.0000 mg | ORAL_TABLET | Freq: Two times a day (BID) | ORAL | Status: DC

## 2015-10-30 MED ORDER — ONDANSETRON HCL 4 MG PO TABS
4.0000 mg | ORAL_TABLET | Freq: Four times a day (QID) | ORAL | Status: DC | PRN
  Administered 2015-10-31: 4 mg via ORAL
  Filled 2015-10-30: qty 1

## 2015-10-30 MED ORDER — PANTOPRAZOLE SODIUM 40 MG PO TBEC
40.0000 mg | DELAYED_RELEASE_TABLET | Freq: Two times a day (BID) | ORAL | Status: DC
  Administered 2015-10-30 – 2015-11-02 (×6): 40 mg via ORAL
  Filled 2015-10-30 (×6): qty 1

## 2015-10-30 MED ORDER — INSULIN GLARGINE 100 UNIT/ML ~~LOC~~ SOLN
10.0000 [IU] | Freq: Every day | SUBCUTANEOUS | Status: DC
  Administered 2015-10-30 – 2015-11-01 (×3): 10 [IU] via SUBCUTANEOUS
  Filled 2015-10-30 (×4): qty 0.1

## 2015-10-30 MED ORDER — ACETAMINOPHEN 325 MG PO TABS: 650.0000 mg | ORAL_TABLET | Freq: Four times a day (QID) | ORAL | Status: DC | PRN

## 2015-10-30 MED ORDER — ONDANSETRON HCL 4 MG/2ML IJ SOLN
4.0000 mg | Freq: Once | INTRAMUSCULAR | Status: AC
  Administered 2015-10-30: 4 mg via INTRAVENOUS
  Filled 2015-10-30: qty 2

## 2015-10-30 MED ORDER — PRAVASTATIN SODIUM 40 MG PO TABS
40.0000 mg | ORAL_TABLET | Freq: Every day | ORAL | Status: DC
  Administered 2015-10-30 – 2015-11-01 (×3): 40 mg via ORAL
  Filled 2015-10-30 (×3): qty 1

## 2015-10-30 MED ORDER — INFLUENZA VAC SPLIT QUAD 0.5 ML IM SUSY
0.5000 mL | PREFILLED_SYRINGE | INTRAMUSCULAR | Status: AC
  Administered 2015-10-31: 0.5 mL via INTRAMUSCULAR
  Filled 2015-10-30: qty 0.5

## 2015-10-30 MED ORDER — SODIUM CHLORIDE 0.9% FLUSH
3.0000 mL | Freq: Two times a day (BID) | INTRAVENOUS | Status: DC
  Administered 2015-10-31 – 2015-11-02 (×5): 3 mL via INTRAVENOUS

## 2015-10-30 MED ORDER — INSULIN GLARGINE 100 UNITS/ML SOLOSTAR PEN
10.0000 [IU] | PEN_INJECTOR | Freq: Every day | SUBCUTANEOUS | Status: DC
  Filled 2015-10-30: qty 3

## 2015-10-30 MED ORDER — HYDROCORTISONE 10 MG PO TABS
10.0000 mg | ORAL_TABLET | Freq: Every day | ORAL | Status: DC
  Administered 2015-10-30 – 2015-11-01 (×3): 10 mg via ORAL
  Filled 2015-10-30 (×3): qty 1

## 2015-10-30 MED ORDER — INSULIN ASPART 100 UNIT/ML ~~LOC~~ SOLN: 0.0000 [IU] | Freq: Every day | SUBCUTANEOUS | Status: DC

## 2015-10-30 MED ORDER — INSULIN ASPART 100 UNIT/ML ~~LOC~~ SOLN
0.0000 [IU] | Freq: Three times a day (TID) | SUBCUTANEOUS | Status: DC
  Administered 2015-10-30 – 2015-10-31 (×4): 2 [IU] via SUBCUTANEOUS
  Administered 2015-11-01: 1 [IU] via SUBCUTANEOUS
  Administered 2015-11-01: 2 [IU] via SUBCUTANEOUS
  Administered 2015-11-01 – 2015-11-02 (×3): 1 [IU] via SUBCUTANEOUS
  Filled 2015-10-30 (×3): qty 2
  Filled 2015-10-30 (×4): qty 1
  Filled 2015-10-30: qty 2
  Filled 2015-10-30: qty 1
  Filled 2015-10-30: qty 2

## 2015-10-30 MED ORDER — IOPAMIDOL (ISOVUE-300) INJECTION 61%
30.0000 mL | Freq: Once | INTRAVENOUS | Status: AC
  Administered 2015-10-30: 30 mL via ORAL

## 2015-10-30 MED ORDER — SODIUM CHLORIDE 0.9 % IV SOLN
INTRAVENOUS | Status: DC
Start: 2015-10-30 — End: 2015-11-02
  Administered 2015-10-30 – 2015-11-02 (×5): via INTRAVENOUS

## 2015-10-30 MED ORDER — METRONIDAZOLE IN NACL 5-0.79 MG/ML-% IV SOLN
500.0000 mg | Freq: Once | INTRAVENOUS | Status: AC
  Administered 2015-10-30: 500 mg via INTRAVENOUS
  Filled 2015-10-30: qty 100

## 2015-10-30 MED ORDER — ONDANSETRON HCL 4 MG/2ML IJ SOLN
4.0000 mg | Freq: Four times a day (QID) | INTRAMUSCULAR | Status: DC | PRN
  Administered 2015-11-02: 4 mg via INTRAVENOUS
  Filled 2015-10-30 (×2): qty 2

## 2015-10-30 MED ORDER — FLUDROCORTISONE ACETATE 0.1 MG PO TABS
0.1000 mg | ORAL_TABLET | Freq: Every day | ORAL | Status: DC
  Administered 2015-10-30 – 2015-11-02 (×4): 0.1 mg via ORAL
  Filled 2015-10-30 (×5): qty 1

## 2015-10-30 NOTE — H&P (Addendum)
[place] [facility] [place] NAME: Jordan Brewer    MR#:  [id number]  DATE OF BIRTH:  November 19, 1940   DATE OF ADMISSION:  10/30/2015  PRIMARY CARE PHYSICIAN: Tracie Harrier, MD   REQUESTING/REFERRING PHYSICIAN: [place]  CHIEF COMPLAINT:   Chief Complaint  Patient presents with  . Rectal Bleeding  . Loss of Consciousness    HISTORY OF PRESENT ILLNESS:  Jordan Brewer  is a 75 y.o. female with a known history of Type 2 diabetes insulin requiring who is presenting with abdominal pain and bright red blood per rectum. She describes one day duration intermittent abdominal pain left lower quadrant crampy in quality 6/10 intensity and no worsening or relieving factors associated bright red blood per rectum 1 She states while walking to the bathroom made it to the toilet and suffered a syncopal episode while sitting on the toilet. PAST MEDICAL HISTORY:   Past Medical History:  Diagnosis Date  . Addison's disease ([place])   . Ankle fracture, left 02/2012  . Arthritis    "all over my body" (04/10/2012)  . Bipolar depression ([place])   . Cancer ([place])   . Chronic back pain    "since my hip OR in 09/2010"  . Depression   . DVT, lower extremity ([place]) 09/2010   "left; after hip replacement" (04/10/2012)  . Fall at home 02/2012   'foot got caught on heating pad cord; broke left ankle" (04/10/2012)  . GERD (gastroesophageal reflux disease)   . History of blood transfusion   . Hypercholesteremia   . Hypertension   . Syncope and collapse 04/10/2012   "w/loss of consciousness" (04/10/2012)  . Type II diabetes mellitus ([place])     PAST SURGICAL HISTORY:   Past Surgical History:  Procedure Laterality Date  . APPENDECTOMY  ~ 1949  . AUGMENTATION MAMMAPLASTY Bilateral 1970  . CATARACT EXTRACTION W/ INTRAOCULAR LENS  IMPLANT, BILATERAL Bilateral ~ 2010  . COLOSTOMY  ~ 2010  . COLOSTOMY REVERSAL  ~ 2010   "wore a bag for ~ 6 months" (04/10/2012)  . DILATION AND  CURETTAGE OF UTERUS  ~ 1964   "after a miscarriage" (04/10/2012)  . JOINT REPLACEMENT    . NASAL HEMORRHAGE CONTROL  ~ 2009   "had to have blood transfusion" (04/10/2012)  . TONSILLECTOMY  ~ 1949  . TOTAL HIP ARTHROPLASTY Left 09/2010  . VAGINAL HYSTERECTOMY  1970's?    SOCIAL HISTORY:   Social History  Substance Use Topics  . Smoking status: Never Smoker  . Smokeless tobacco: Never Used  . Alcohol use No    FAMILY HISTORY:   Family History  Problem Relation Age of Onset  . Hypertension Mother   . Diabetes Mellitus II Mother   . Heart attack Mother   . Lung cancer Father     DRUG ALLERGIES:   Allergies  Allergen Reactions  . Codeine Nausea And Vomiting  . 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 Promethazine Nausea And Vomiting  . Lidoderm [Lidocaine] Rash    REVIEW OF SYSTEMS:  REVIEW OF SYSTEMS:  CONSTITUTIONAL: Denies fevers, chills, fatigue, weakness.  EYES: Denies blurred vision, double vision, or eye pain.  EARS, NOSE, THROAT: Denies tinnitus, ear pain, hearing loss.  RESPIRATORY: denies cough, shortness of breath, wheezing  CARDIOVASCULAR: Denies chest pain, palpitations, edema.  GASTROINTESTINAL: Positive nausea, , Bright red blood per rectum, abdominal pain. Denies vomiting GENITOURINARY: Denies dysuria, hematuria.  ENDOCRINE: Denies nocturia or  thyroid problems. HEMATOLOGIC AND LYMPHATIC: Denies easy bruising or bleeding.  SKIN: Denies rash or lesions.  MUSCULOSKELETAL: Denies pain in neck, back, shoulder, knees, hips, or further arthritic symptoms.  NEUROLOGIC: Denies paralysis, paresthesias.  PSYCHIATRIC: Denies anxiety or depressive symptoms. Otherwise full review of systems performed by me is negative.   MEDICATIONS AT HOME:   Prior to Admission medications   Medication Sig Start Date End Date Taking? Authorizing Provider  amLODipine (NORVASC) 10 MG tablet Take 10 mg by mouth daily.   Yes Historical Provider, MD    docusate sodium (COLACE) 100 MG capsule Take 1 capsule (100 mg total) by mouth 2 (two) times daily. Patient taking differently: Take 100 mg by mouth 2 (two) times daily as needed for moderate constipation.  11/16/14  Yes Gladstone Lighter, MD  DULoxetine (CYMBALTA) 60 MG capsule Take 60 mg by mouth daily.   Yes Historical Provider, MD  fludrocortisone (FLORINEF) 0.1 MG tablet Take 0.1 mg by mouth daily.   Yes Historical Provider, MD  hydrocortisone (CORTEF) 10 MG tablet Take 10-20 mg by mouth 2 (two) times daily. 20 mg every morning and 10 mg at bedtime   Yes Historical Provider, MD  insulin aspart (NOVOLOG) 100 UNIT/ML injection Inject into the skin 3 (three) times daily before meals. Per sliding scale   Yes Historical Provider, MD  insulin glargine (LANTUS) 100 unit/mL SOPN Inject 10 Units into the skin at bedtime.   Yes Historical Provider, MD  lisinopril (PRINIVIL,ZESTRIL) 20 MG tablet Take 20 mg by mouth 2 (two) times daily.   Yes Historical Provider, MD  lovastatin (MEVACOR) 40 MG tablet Take 40 mg by mouth at bedtime. 08/20/14  Yes Historical Provider, MD  omeprazole (PRILOSEC) 40 MG capsule Take 40 mg by mouth 2 (two) times daily.   Yes Historical Provider, MD  oxyCODONE-acetaminophen (PERCOCET/ROXICET) 5-325 MG tablet Take 1 tablet by mouth daily as needed for severe pain.    Yes Historical Provider, MD  Polyethyl Glycol-Propyl Glycol (SYSTANE OP) Apply 1 drop to eye daily as needed. For burning/irritated eyes   Yes Historical Provider, MD  TRULICITY [id number] SOPN Inject 0.5 mLs into the skin every Wednesday.   Yes Historical Provider, MD      VITAL SIGNS:  Blood pressure 129/75, pulse (!) 116, temperature 98 F (36.7 C), resp. rate (!) 23, height 5\' 4"  (1.626 m), weight 74.8 kg (165 lb), SpO2 100 %.  PHYSICAL EXAMINATION:  VITAL SIGNS: Vitals:   10/30/15 1101 10/30/15 1130  BP: 128/70 129/75  Pulse:  (!) 116  Resp:  (!) 23  Temp:     GENERAL:75 y.o.female currently in no  acute distress.  HEAD: Normocephalic, atraumatic.  EYES: Pupils equal, round, reactive to light. Extraocular muscles intact. No scleral icterus.  MOUTH: Moist mucosal membrane. Dentition intact. No abscess noted.  EAR, NOSE, THROAT: Clear without exudates. No external lesions.  NECK: Supple. No thyromegaly. No nodules. No JVD.  PULMONARY: Clear to ascultation, without wheeze rails or rhonci. No use of accessory muscles, Good respiratory effort. good air entry bilaterally CHEST: Nontender to palpation.  CARDIOVASCULAR: S1 and S2. Regular rate and rhythm. No murmurs, rubs, or gallops. No edema. Pedal pulses 2+ bilaterally.  GASTROINTESTINAL: Soft, Tender left lower quadrant without rebound or guarding, nondistended. No masses. Positive bowel sounds. No hepatosplenomegaly.  MUSCULOSKELETAL: No swelling, clubbing, or edema. Range of motion full in all extremities.  NEUROLOGIC: Cranial nerves II through XII are intact. No gross focal neurological deficits. Sensation intact. Reflexes intact.  SKIN:  No ulceration, lesions, rashes, or cyanosis. Skin warm and dry. Turgor intact.  PSYCHIATRIC: Mood, affect within normal limits. The patient is awake, alert and oriented x 3. Insight, judgment intact.    LABORATORY PANEL:   CBC  Recent Labs Lab 10/30/15 1103  WBC 16.4*  HGB 13.2  HCT 42.3  PLT 384   ------------------------------------------------------------------------------------------------------------------  Chemistries   Recent Labs Lab 10/30/15 1103  NA 139  K 3.4*  CL 104  CO2 23  GLUCOSE 231*  BUN 18  CREATININE 1.22*  CALCIUM 9.7  AST 19  ALT 12*  ALKPHOS 84  BILITOT 0.6   ------------------------------------------------------------------------------------------------------------------  Cardiac Enzymes  Recent Labs Lab 10/30/15 1103  TROPONINI <0.03    ------------------------------------------------------------------------------------------------------------------  RADIOLOGY:  Ct Abdomen Pelvis W Contrast  Result Date: 10/30/2015 CLINICAL DATA:  Left lower quadrant pain, bloody stool this morning, status post colostomy reversal EXAM: CT ABDOMEN AND PELVIS WITH CONTRAST TECHNIQUE: Multidetector CT imaging of the abdomen and pelvis was performed using the standard protocol following bolus administration of intravenous contrast. CONTRAST:  19mL ISOVUE-300 IOPAMIDOL (ISOVUE-300) INJECTION 61% COMPARISON:  08/20/2006 FINDINGS: Lower chest: The lung bases are unremarkable.  Small hiatal hernia Hepatobiliary: Mild fatty infiltration of the liver. No focal hepatic mass. No calcified gallstones are noted within gallbladder. Pancreas: Enhanced pancreas is unremarkable. Main pancreatic duct measures 2.7 mm in diameter. Spleen: Enhanced spleen is unremarkable. Adrenals/Urinary Tract: No adrenal gland mass. Enhanced kidneys are symmetrical in size. No hydronephrosis or hydroureter. Stomach/Bowel: There is no small bowel obstruction. No thickened or dilated small bowel loops. The terminal ileum is unremarkable. Moderate stool noted within cecum. The patient is status post appendectomy. No pericecal inflammation. Colonic diverticula are noted in descending colon. There is abnormal thickening of colonic wall in splenic flexure of the colon and descending colon. There is stranding of pericolonic fat. Findings are consistent with segmental acute colitis. No definite evidence of pericolonic abscess or perforation. No extraluminal air. Again noted postsurgical changes distal colon. There is no evidence of anastomotic stricture. Vascular/Lymphatic: Atherosclerotic calcifications of abdominal aorta and iliac arteries. No aortic aneurysm. Reproductive: The patient is status post hysterectomy. No pelvic masses noted. Other: There are extensive metallic artifact from left hip  prosthesis. Urinary bladder is under distended limiting its assessment. Musculoskeletal: There is interval fracture deformity of right pubic ramus adjacent to pubic symphysis. Sagittal images of the spine shows osteopenia and a degenerative changes thoracolumbar spine. There are degenerative changes bilateral SI joints. IMPRESSION: 1. There is abnormal thickening of colonic wall in splenic flexure of the colon and descending colon. Findings are consistent with acute segmental colitis. There is stranding of pericolonic fat. No pericolonic abscess or definite evidence of perforation. 2. Stable postsurgical changes in distal colon within pelvis without evidence of anastomotic stricture. 3. Moderate stool noted within cecum. No pericecal inflammation. Status post appendectomy. No small bowel obstruction. 4. Status post hysterectomy. 5. Degenerative changes thoracolumbar spine. 6. Small hiatal hernia. 7. Interval fracture deformity of the right pubic bones adjacent to pubic symphysis of indeterminate age. Clinical correlation is necessary. Electronically Signed   By: Lahoma Crocker M.D.   On: 10/30/2015 12:51    EKG:   Orders placed or performed during the hospital encounter of 11/13/14  . ED EKG  . ED EKG  . EKG 12-Lead  . EKG 12-Lead    IMPRESSION AND PLAN:   75 year old Caucasian female history of type 2 diabetes insulin requiring presenting after an episode of bright red blood per rectum with  abdominal pain  1. Sepsis present on admission secondary to Colitis: HR, WBC, Initiate antibiotic Cipro Flagyl transition to oral when she is able to tolerate, follow CBC and transfuse hemoglobin less than 7 2. Syncope: Situational, check orthostatic vital signs , fluid hydration 2. Type 2 diabetes insulin requiring: Continue basal insulin and sliding scale coverage 3. Essential hypertension: Norvasc, lisinopril 4. GERD without esophagitis PPI therapy 5. Hyperlipidemia unspecified: Statin therapy     All  the records are reviewed and case discussed with ED provider. Management plans discussed with the patient, family and they are in agreement.  CODE STATUS: Full  TOTAL TIME TAKING CARE OF THIS PATIENT: 33 minutes.    Velva Molinari,  Karenann Cai.D on 10/30/2015 at 1:31 PM  Between 7am to 6pm - Pager - [phone]  After 6pm: House Pager: - [phone]  [facility]  Office  [phone]  CC: Primary care physician; Tracie Harrier, MD

## 2015-10-30 NOTE — Progress Notes (Signed)
Dr. Jannifer Franklin notified about patient decrease in O2 sat and SOB; order initiated for O2; Patient remains on 2L/; denies SOB. No stool at this time. Will continue to monitor.

## 2015-10-30 NOTE — ED Provider Notes (Signed)
[facility] Emergency Department Provider Note   ____________________________________________   First MD Initiated Contact with Patient 10/30/15 1122     (approximate)  I have reviewed the triage vital signs and the nursing notes.   HISTORY  Chief Complaint Rectal Bleeding and Loss of Consciousness   HPI Jordan Brewer is a 75 y.o. female with a history of colostomy, now reversed, secondary to a perfect diverticula who is presenting to the emergency department with several days of left lower quadrant pain and now with bright red blood per rectum. The patient says that the pain is cramping and a 6-8 out of 10 to the left lower quadrant. Denies any nausea or vomiting. Said that she moved her bowels this morning and had a lot of bright red blood in the toilet bowel. She said that when she stood up she also passed gas and had another episode of bright red bleeding. She said that she began to feel dizzy seen after and passed out. She is not sure exactly how long she was unconscious. She denies any chest pain or shortness of breath. Denies any headache or neck pain. She says that she is not on any blood thinners at this time.   Past Medical History:  Diagnosis Date  . Addison's disease ([place])   . Ankle fracture, left 02/2012  . Arthritis    "all over my body" (04/10/2012)  . Bipolar depression ([place])   . Chronic back pain    "since my hip OR in 09/2010"  . Depression   . DVT, lower extremity ([place]) 09/2010   "left; after hip replacement" (04/10/2012)  . Fall at home 02/2012   'foot got caught on heating pad cord; broke left ankle" (04/10/2012)  . GERD (gastroesophageal reflux disease)   . History of blood transfusion   . Hypercholesteremia   . Hypertension   . Syncope and collapse 04/10/2012   "w/loss of consciousness" (04/10/2012)  . Type II diabetes mellitus [facility])     Patient Active Problem List   Diagnosis Date Noted  . Pelvic fracture, closed, initial  encounter 11/13/2014  . Syncope and collapse 04/10/2012  . Type II or unspecified type diabetes mellitus without mention of complication, not stated as uncontrolled 04/10/2012  . Unspecified essential hypertension 04/10/2012  . Bipolar I disorder, most recent episode (or current) unspecified 04/10/2012  . Addison's disease ([place]) 04/10/2012  . Hypoglycemia 04/10/2012  . Chronic kidney disease 04/10/2012  . SAH (subarachnoid hemorrhage) ([place]) 04/10/2012    Past Surgical History:  Procedure Laterality Date  . APPENDECTOMY  ~ 1949  . AUGMENTATION MAMMAPLASTY Bilateral 1970  . CATARACT EXTRACTION W/ INTRAOCULAR LENS  IMPLANT, BILATERAL Bilateral ~ 2010  . COLOSTOMY  ~ 2010  . COLOSTOMY REVERSAL  ~ 2010   "wore a bag for ~ 6 months" (04/10/2012)  . DILATION AND CURETTAGE OF UTERUS  ~ 1964   "after a miscarriage" (04/10/2012)  . JOINT REPLACEMENT    . NASAL HEMORRHAGE CONTROL  ~ 2009   "had to have blood transfusion" (04/10/2012)  . TONSILLECTOMY  ~ 1949  . TOTAL HIP ARTHROPLASTY Left 09/2010  . VAGINAL HYSTERECTOMY  1970's?    Prior to Admission medications   Medication Sig Start Date End Date Taking? Authorizing Provider  ALPRAZolam Duanne Moron) 0.5 MG tablet Take 1 tablet (0.5 mg total) by mouth 3 (three) times daily as needed for anxiety. 11/16/14   Gladstone Lighter, MD  amLODipine (NORVASC) 5 MG tablet Take 1 tablet (5 mg total)  by mouth daily. 11/16/14   Gladstone Lighter, MD  clotrimazole-betamethasone (LOTRISONE) cream Apply 1 application topically at bedtime as needed. For dry itchy skin. 09/14/14   Historical Provider, MD  cyclobenzaprine (FLEXERIL) 10 MG tablet Take 1 tablet (10 mg total) by mouth 3 (three) times daily as needed for muscle spasms. 11/16/14   Gladstone Lighter, MD  docusate sodium (COLACE) 100 MG capsule Take 1 capsule (100 mg total) by mouth 2 (two) times daily. 11/16/14   Gladstone Lighter, MD  DULoxetine (CYMBALTA) 60 MG capsule Take 60 mg by mouth daily.    Historical  Provider, MD  fludrocortisone (FLORINEF) 0.1 MG tablet Take 0.1 mg by mouth daily.    Historical Provider, MD  HUMALOG KWIKPEN 100 UNIT/ML KiwkPen 3 (three) times daily before meals. Inject as directed per sliding scale 08/20/14   Historical Provider, MD  hydrocortisone (CORTEF) 10 MG tablet Take 10-20 mg by mouth See admin instructions. Take 2 tablets (20mg ) orally every morning. Take 1 tablet orally once a day (at bedtime).    Historical Provider, MD  HYDROmorphone (DILAUDID) 2 MG tablet Take 1 tablet (2 mg total) by mouth every 4 (four) hours as needed for severe pain. 11/16/14   Gladstone Lighter, MD  insulin glargine (LANTUS) 100 unit/mL SOPN Inject 10 Units into the skin at bedtime.    Historical Provider, MD  lovastatin (MEVACOR) 40 MG tablet Take 40 mg by mouth at bedtime. 08/20/14   Historical Provider, MD  omeprazole (PRILOSEC) 40 MG capsule Take 40 mg by mouth 2 (two) times daily.    Historical Provider, MD  Polyethyl Glycol-Propyl Glycol (SYSTANE OP) Apply 1 drop to eye daily as needed. For burning/irritated eyes    Historical Provider, MD    Allergies Codeine; Prednisone; Promethazine; and Lidoderm [lidocaine]  Family History  Problem Relation Age of Onset  . Hypertension Mother   . Diabetes Mellitus II Mother   . Heart attack Mother   . Lung cancer Father     Social History Social History  Substance Use Topics  . Smoking status: Never Smoker  . Smokeless tobacco: Never Used  . Alcohol use No    Review of Systems Constitutional: No fever/chills Eyes: No visual changes. ENT: No sore throat. Cardiovascular: Denies chest pain. Respiratory: Denies shortness of breath. Gastrointestinal:No nausea, no vomiting.  No diarrhea.  No constipation. Genitourinary: Negative for dysuria. Musculoskeletal: Negative for back pain. Skin: Negative for rash. Neurological: Negative for headaches, focal weakness or numbness.  10-point ROS otherwise  negative.  ____________________________________________   PHYSICAL EXAM:  VITAL SIGNS: ED Triage Vitals  Enc Vitals Group     BP 10/30/15 1101 128/70     Pulse Rate 10/30/15 1100 (!) 130     Resp 10/30/15 1059 18     Temp 10/30/15 1059 98 F (36.7 C)     Temp src --      SpO2 10/30/15 1100 100 %     Weight 10/30/15 1100 165 lb (74.8 kg)     Height 10/30/15 1100 5\' 4"  (1.626 m)     Head Circumference --      Peak Flow --      Pain Score 10/30/15 1100 9     Pain Loc --      Pain Edu? --      Excl. in [place]? --     Constitutional: Alert and oriented. Well appearing and in no acute distress. Eyes: Conjunctivae are normal. PERRL. EOMI. Head: Atraumatic. Nose: No congestion/rhinnorhea. Mouth/Throat: Mucous membranes are  moist.   Neck: No stridor.   Cardiovascular: Tachycardic, regular rhythm. Grossly normal heart sounds.   Respiratory: Normal respiratory effort.  No retractions. Lungs CTAB. Gastrointestinal: Soft with mild to moderate left lower quadrant abdominal tenderness to palpation without rebound or guarding. No distention. Musculoskeletal: No lower extremity tenderness nor edema.  No joint effusions. Neurologic:  Normal speech and language. No gross focal neurologic deficits are appreciated.  Skin:  Skin is warm, dry and intact. No rash noted. Psychiatric: Mood and affect are normal. Speech and behavior are normal.  ____________________________________________   LABS (all labs ordered are listed, but only abnormal results are displayed)  Labs Reviewed  COMPREHENSIVE METABOLIC PANEL - Abnormal; Notable for the following:       Result Value   Potassium 3.4 (*)    Glucose, Bld 231 (*)    Creatinine, Ser 1.22 (*)    ALT 12 (*)    GFR calc non Af Amer 43 (*)    GFR calc Af Amer 50 (*)    All other components within normal limits  CBC - Abnormal; Notable for the following:    WBC 16.4 (*)    MCH 25.5 (*)    MCHC 31.3 (*)    RDW 18.1 (*)    All other components  within normal limits  LIPASE, BLOOD  TROPONIN I  POC OCCULT BLOOD, ED  TYPE AND SCREEN   ____________________________________________  EKG  ED ECG REPORT I, Doran Stabler, the attending physician, personally viewed and interpreted this ECG.   Date: 10/30/2015  EKG Time: 1318  Rate: 108  Rhythm: sinus tachycardia  Axis: Normal  Intervals:none  ST&T Change: No ST segment elevation or depression. No abnormal T-wave inversion.  ____________________________________________  RADIOLOGY  CT Abdomen Pelvis W Contrast (Accession [id number]) (Order [id number])  Imaging  Date: 10/30/2015 Department: [facility] EMERGENCY DEPARTMENT Released By/Authorizing: Orbie Pyo, MD (auto-released)  Exam Information   Status Exam Begun  Exam Ended   Final [99] 10/30/2015 12:14 PM 10/30/2015 12:35 PM  PACS Images   Show images for CT Abdomen Pelvis W Contrast  Study Result   CLINICAL DATA:  Left lower quadrant pain, bloody stool this morning, status post colostomy reversal  EXAM: CT ABDOMEN AND PELVIS WITH CONTRAST  TECHNIQUE: Multidetector CT imaging of the abdomen and pelvis was performed using the standard protocol following bolus administration of intravenous contrast.  CONTRAST:  68mL ISOVUE-300 IOPAMIDOL (ISOVUE-300) INJECTION 61%  COMPARISON:  08/20/2006  FINDINGS: Lower chest: The lung bases are unremarkable.  Small hiatal hernia  Hepatobiliary: Mild fatty infiltration of the liver. No focal hepatic mass. No calcified gallstones are noted within gallbladder.  Pancreas: Enhanced pancreas is unremarkable. Main pancreatic duct measures 2.7 mm in diameter.  Spleen: Enhanced spleen is unremarkable.  Adrenals/Urinary Tract: No adrenal gland mass. Enhanced kidneys are symmetrical in size. No hydronephrosis or hydroureter.  Stomach/Bowel: There is no small bowel obstruction. No thickened or dilated small bowel loops. The  terminal ileum is unremarkable. Moderate stool noted within cecum. The patient is status post appendectomy. No pericecal inflammation.  Colonic diverticula are noted in descending colon. There is abnormal thickening of colonic wall in splenic flexure of the colon and descending colon. There is stranding of pericolonic fat. Findings are consistent with segmental acute colitis. No definite evidence of pericolonic abscess or perforation. No extraluminal air. Again noted postsurgical changes distal colon. There is no evidence of anastomotic stricture.  Vascular/Lymphatic: Atherosclerotic calcifications of abdominal aorta and iliac  arteries. No aortic aneurysm.  Reproductive: The patient is status post hysterectomy. No pelvic masses noted.  Other: There are extensive metallic artifact from left hip prosthesis. Urinary bladder is under distended limiting its assessment.  Musculoskeletal: There is interval fracture deformity of right pubic ramus adjacent to pubic symphysis.  Sagittal images of the spine shows osteopenia and a degenerative changes thoracolumbar spine. There are degenerative changes bilateral SI joints.  IMPRESSION: 1. There is abnormal thickening of colonic wall in splenic flexure of the colon and descending colon. Findings are consistent with acute segmental colitis. There is stranding of pericolonic fat. No pericolonic abscess or definite evidence of perforation. 2. Stable postsurgical changes in distal colon within pelvis without evidence of anastomotic stricture. 3. Moderate stool noted within cecum. No pericecal inflammation. Status post appendectomy. No small bowel obstruction. 4. Status post hysterectomy. 5. Degenerative changes thoracolumbar spine. 6. Small hiatal hernia. 7. Interval fracture deformity of the right pubic bones adjacent to pubic symphysis of indeterminate age. Clinical correlation is necessary.   Electronically Signed   By:  Lahoma Crocker M.D.   On: 10/30/2015 12:51     ____________________________________________   PROCEDURES  Procedure(s) performed:   Procedures  Critical Care performed:   ____________________________________________   INITIAL IMPRESSION / ASSESSMENT AND PLAN / ED COURSE  Pertinent labs & imaging results that were available during my care of the patient were reviewed by me and considered in my medical decision making (see chart for details).    Clinical Course   ----------------------------------------- 1:16 PM on 10/30/2015 -----------------------------------------  Patient still having cramping. No further episodes of bleeding per rectum. Still tachycardic to the 100s. We'll be admitting to the hospital on IV antibiotics. Inform the patient about the need for admission. I feel she is not appropriate for discharge to home because of her red blood per rectum with tachycardia as well as syncope. Signed out to Dr. Lavetta Nielsen. The patient is understanding of the plan and willing to comply.  ____________________________________________   FINAL CLINICAL IMPRESSION(S) / ED DIAGNOSES  Colitis. Acute GI bleeding. Syncope.    NEW MEDICATIONS STARTED DURING THIS VISIT:  New Prescriptions   No medications on file     Note:  This document was prepared using Dragon voice recognition software and may include unintentional dictation errors.    Orbie Pyo, MD 10/30/15 [phone]

## 2015-10-30 NOTE — ED Triage Notes (Signed)
States started to pass blood through bowels this morning.  Woke up this morning and used the bathroom and saw blood in PJ's and sheets.  Patient states she had a syncopal episode while on the toilet.  Patient c/o feeling sick and dizzy.

## 2015-10-31 LAB — CBC
HEMATOCRIT: 32.5 % — AB (ref 35.0–47.0)
HEMATOCRIT: 34 % — AB (ref 35.0–47.0)
HEMATOCRIT: 34.8 % — AB (ref 35.0–47.0)
HEMOGLOBIN: 11.2 g/dL — AB (ref 12.0–16.0)
Hemoglobin: 10.5 g/dL — ABNORMAL LOW (ref 12.0–16.0)
Hemoglobin: 10.9 g/dL — ABNORMAL LOW (ref 12.0–16.0)
MCH: 25.9 pg — ABNORMAL LOW (ref 26.0–34.0)
MCH: 26.1 pg (ref 26.0–34.0)
MCH: 26.2 pg (ref 26.0–34.0)
MCHC: 32.2 g/dL (ref 32.0–36.0)
MCHC: 31.9 g/dL — ABNORMAL LOW (ref 32.0–36.0)
MCHC: 32.3 g/dL (ref 32.0–36.0)
MCV: 80.5 fL (ref 80.0–100.0)
MCV: 81.7 fL (ref 80.0–100.0)
MCV: 81 fL (ref 80.0–100.0)
Platelets: 261 10*3/uL (ref 150–440)
Platelets: 255 10*3/uL (ref 150–440)
Platelets: 260 10*3/uL (ref 150–440)
RBC: 4.04 MIL/uL (ref 3.80–5.20)
RBC: 4.16 MIL/uL (ref 3.80–5.20)
RBC: 4.29 MIL/uL (ref 3.80–5.20)
RDW: 17.9 % — AB (ref 11.5–14.5)
RDW: 17.9 % — AB (ref 11.5–14.5)
RDW: 18 % — ABNORMAL HIGH (ref 11.5–14.5)
WBC: 11.8 10*3/uL — ABNORMAL HIGH (ref 3.6–11.0)
WBC: 13.2 10*3/uL — AB (ref 3.6–11.0)
WBC: 16.6 10*3/uL — ABNORMAL HIGH (ref 3.6–11.0)

## 2015-10-31 LAB — BASIC METABOLIC PANEL
Anion gap: 5 (ref 5–15)
BUN: 9 mg/dL (ref 6–20)
CALCIUM: 8.2 mg/dL — AB (ref 8.9–10.3)
CO2: 25 mmol/L (ref 22–32)
CREATININE: 0.98 mg/dL (ref 0.44–1.00)
Chloride: 106 mmol/L (ref 101–111)
GFR calc Af Amer: >60 mL/min (ref >60)
GFR calc non Af Amer: 56 mL/min — ABNORMAL LOW (ref >60)
GLUCOSE: 186 mg/dL — AB (ref 65–99)
Potassium: 3.7 mmol/L (ref 3.5–5.1)
Sodium: 136 mmol/L (ref 135–145)

## 2015-10-31 LAB — GLUCOSE, CAPILLARY
Glucose-Capillary: 169 mg/dL — ABNORMAL HIGH (ref 65–99)
Glucose-Capillary: 176 mg/dL — ABNORMAL HIGH (ref 65–99)
Glucose-Capillary: 179 mg/dL — ABNORMAL HIGH (ref 65–99)
Glucose-Capillary: 175 mg/dL — ABNORMAL HIGH (ref 65–99)

## 2015-10-31 MED ORDER — OXYCODONE HCL 5 MG PO TABS
5.0000 mg | ORAL_TABLET | Freq: Once | ORAL | Status: AC
  Administered 2015-10-31: 5 mg via ORAL
  Filled 2015-10-31: qty 1

## 2015-10-31 MED ORDER — METRONIDAZOLE 500 MG PO TABS
500.0000 mg | ORAL_TABLET | Freq: Three times a day (TID) | ORAL | Status: DC
  Administered 2015-10-31 – 2015-11-02 (×6): 500 mg via ORAL
  Filled 2015-10-31 (×6): qty 1

## 2015-10-31 MED ORDER — CIPROFLOXACIN HCL 500 MG PO TABS
500.0000 mg | ORAL_TABLET | Freq: Two times a day (BID) | ORAL | Status: DC
  Administered 2015-10-31 – 2015-11-02 (×4): 500 mg via ORAL
  Filled 2015-10-31 (×5): qty 1

## 2015-10-31 NOTE — Progress Notes (Signed)
[place] [facility] [place] NAME: Jordan Brewer    MR#:  [id number]  DATE OF BIRTH:  10-31-1940  SUBJECTIVE:  CHIEF COMPLAINT:   Chief Complaint  Patient presents with  . Rectal Bleeding  . Loss of Consciousness    REVIEW OF SYSTEMS:  CONSTITUTIONAL: No fever, fatigue or weakness.  EYES: No blurred or double vision.  EARS, NOSE, AND THROAT: No tinnitus or ear pain.  RESPIRATORY: No cough, shortness of breath, wheezing or hemoptysis.  CARDIOVASCULAR: No chest pain, orthopnea, edema.  GASTROINTESTINAL: No nausea, vomiting, diarrhea or abdominal pain.  GENITOURINARY: No dysuria, hematuria.  ENDOCRINE: No polyuria, nocturia,  HEMATOLOGY: No anemia, easy bruising or bleeding SKIN: No rash or lesion. MUSCULOSKELETAL: No joint pain or arthritis.   NEUROLOGIC: No tingling, numbness, weakness.  PSYCHIATRY: No anxiety or depression.   ROS  DRUG ALLERGIES:   Allergies  Allergen Reactions  . Codeine Nausea And Vomiting  . 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 Promethazine Nausea And Vomiting  . Lidoderm [Lidocaine] Rash    VITALS:  Blood pressure (!) 95/55, pulse 91, temperature 98.3 F (36.8 C), temperature source Oral, resp. rate 18, height 5\' 4"  (1.626 m), weight 74.8 kg (165 lb), SpO2 98 %.  PHYSICAL EXAMINATION:  GENERAL:  75 y.o.-year-old patient lying in the bed with no acute distress.  EYES: Pupils equal, round, reactive to light and accommodation. No scleral icterus. Extraocular muscles intact.  HEENT: Head atraumatic, normocephalic. Oropharynx and nasopharynx clear.  NECK:  Supple, no jugular venous distention. No thyroid enlargement, no tenderness.  LUNGS: Normal breath sounds bilaterally, no wheezing, rales,rhonchi or crepitation. No use of accessory muscles of respiration.  CARDIOVASCULAR: S1, S2 normal. No murmurs, rubs, or gallops.  ABDOMEN: Soft, nontender, nondistended. Bowel sounds  present. No organomegaly or mass.  EXTREMITIES: No pedal edema, cyanosis, or clubbing.  NEUROLOGIC: Cranial nerves II through XII are intact. Muscle strength 5/5 in all extremities. Sensation intact. Gait not checked.  PSYCHIATRIC: The patient is alert and oriented x 3.  SKIN: No obvious rash, lesion, or ulcer.   Physical Exam LABORATORY PANEL:   CBC  Recent Labs Lab 10/31/15 1458  WBC 16.6*  HGB 11.2*  HCT 34.8*  PLT 260   ------------------------------------------------------------------------------------------------------------------  Chemistries   Recent Labs Lab 10/30/15 1103 10/31/15 0249  NA 139 136  K 3.4* 3.7  CL 104 106  CO2 23 25  GLUCOSE 231* 186*  BUN 18 9  CREATININE 1.22* 0.98  CALCIUM 9.7 8.2*  AST 19  --   ALT 12*  --   ALKPHOS 84  --   BILITOT 0.6  --    ------------------------------------------------------------------------------------------------------------------  Cardiac Enzymes  Recent Labs Lab 10/30/15 1103  TROPONINI <0.03   ------------------------------------------------------------------------------------------------------------------  RADIOLOGY:  Ct Abdomen Pelvis W Contrast  Result Date: 10/30/2015 CLINICAL DATA:  Left lower quadrant pain, bloody stool this morning, status post colostomy reversal EXAM: CT ABDOMEN AND PELVIS WITH CONTRAST TECHNIQUE: Multidetector CT imaging of the abdomen and pelvis was performed using the standard protocol following bolus administration of intravenous contrast. CONTRAST:  11mL ISOVUE-300 IOPAMIDOL (ISOVUE-300) INJECTION 61% COMPARISON:  08/20/2006 FINDINGS: Lower chest: The lung bases are unremarkable.  Small hiatal hernia Hepatobiliary: Mild fatty infiltration of the liver. No focal hepatic mass. No calcified gallstones are noted within gallbladder. Pancreas: Enhanced pancreas is unremarkable. Main pancreatic duct measures 2.7 mm in diameter. Spleen: Enhanced spleen is unremarkable.  Adrenals/Urinary Tract: No  adrenal gland mass. Enhanced kidneys are symmetrical in size. No hydronephrosis or hydroureter. Stomach/Bowel: There is no small bowel obstruction. No thickened or dilated small bowel loops. The terminal ileum is unremarkable. Moderate stool noted within cecum. The patient is status post appendectomy. No pericecal inflammation. Colonic diverticula are noted in descending colon. There is abnormal thickening of colonic wall in splenic flexure of the colon and descending colon. There is stranding of pericolonic fat. Findings are consistent with segmental acute colitis. No definite evidence of pericolonic abscess or perforation. No extraluminal air. Again noted postsurgical changes distal colon. There is no evidence of anastomotic stricture. Vascular/Lymphatic: Atherosclerotic calcifications of abdominal aorta and iliac arteries. No aortic aneurysm. Reproductive: The patient is status post hysterectomy. No pelvic masses noted. Other: There are extensive metallic artifact from left hip prosthesis. Urinary bladder is under distended limiting its assessment. Musculoskeletal: There is interval fracture deformity of right pubic ramus adjacent to pubic symphysis. Sagittal images of the spine shows osteopenia and a degenerative changes thoracolumbar spine. There are degenerative changes bilateral SI joints. IMPRESSION: 1. There is abnormal thickening of colonic wall in splenic flexure of the colon and descending colon. Findings are consistent with acute segmental colitis. There is stranding of pericolonic fat. No pericolonic abscess or definite evidence of perforation. 2. Stable postsurgical changes in distal colon within pelvis without evidence of anastomotic stricture. 3. Moderate stool noted within cecum. No pericecal inflammation. Status post appendectomy. No small bowel obstruction. 4. Status post hysterectomy. 5. Degenerative changes thoracolumbar spine. 6. Small hiatal hernia. 7. Interval  fracture deformity of the right pubic bones adjacent to pubic symphysis of indeterminate age. Clinical correlation is necessary. Electronically Signed   By: Lahoma Crocker M.D.   On: 10/30/2015 12:51    ASSESSMENT AND PLAN:   Active Problems:   Colitis   Sepsis ([place])  1. Sepsis present on admission secondary to Colitis: HR, WBC,     Cipro Flagyl transition to oral , follow CBC and transfuse hemoglobin less than 7    Waiting to collect GI panel and C diff- pt did not have any BM since yesterday evening. 2. Syncope: Situational, checked orthostatic vital signs - severe rise in HR on standing , fluid hydration 2. Type 2 diabetes insulin requiring: Continue basal insulin and sliding scale coverage 3. Essential hypertension: Norvasc, lisinopril 4. GERD without esophagitis PPI therapy 5. Hyperlipidemia unspecified: Statin therapy    All the records are reviewed and case discussed with Care Management/Social Workerr. Management plans discussed with the patient, family and they are in agreement.  CODE STATUS: full.  TOTAL TIME TAKING CARE OF THIS PATIENT: 35 minutes.    POSSIBLE D/C IN 1-2 DAYS, DEPENDING ON CLINICAL CONDITION.   Vaughan Basta M.D on 10/31/2015   Between 7am to 6pm - Pager - [phone]  After 6pm go to [email or web address] - password [email or web address] [facility]  Office  [phone]  CC: Primary care physician; Tracie Harrier, MD  Note: This dictation was prepared with Dragon dictation along with smaller phrase technology. Any transcriptional errors that result from this process are unintentional.

## 2015-10-31 NOTE — Progress Notes (Signed)
Called Dr. Jannifer Franklin regarding pain medication per patient request.  Appropriate orders were placed.  Jordan Brewer  10/31/2015  11:25 PM

## 2015-11-01 ENCOUNTER — Inpatient Hospital Stay: Payer: Commercial Managed Care - HMO

## 2015-11-01 LAB — CBC
HCT: 33.1 % — ABNORMAL LOW (ref 35.0–47.0)
Hemoglobin: 10.7 g/dL — ABNORMAL LOW (ref 12.0–16.0)
MCH: 26.3 pg (ref 26.0–34.0)
MCHC: 32.3 g/dL (ref 32.0–36.0)
MCV: 81.3 fL (ref 80.0–100.0)
PLATELETS: 275 10*3/uL (ref 150–440)
RBC: 4.07 MIL/uL (ref 3.80–5.20)
RDW: 17.7 % — ABNORMAL HIGH (ref 11.5–14.5)
WBC: 10.5 10*3/uL (ref 3.6–11.0)

## 2015-11-01 LAB — GLUCOSE, CAPILLARY
GLUCOSE-CAPILLARY: 149 mg/dL — AB (ref 65–99)
GLUCOSE-CAPILLARY: 142 mg/dL — AB (ref 65–99)
GLUCOSE-CAPILLARY: 165 mg/dL — AB (ref 65–99)
GLUCOSE-CAPILLARY: 134 mg/dL — AB (ref 65–99)

## 2015-11-01 LAB — BASIC METABOLIC PANEL
ANION GAP: 6 (ref 5–15)
BUN: 8 mg/dL (ref 6–20)
CO2: 24 mmol/L (ref 22–32)
Calcium: 8.6 mg/dL — ABNORMAL LOW (ref 8.9–10.3)
Chloride: 109 mmol/L (ref 101–111)
Creatinine, Ser: 0.93 mg/dL (ref 0.44–1.00)
GFR, EST NON AFRICAN AMERICAN: 60 mL/min — AB (ref >60)
GLUCOSE: 162 mg/dL — AB (ref 65–99)
POTASSIUM: 4.1 mmol/L (ref 3.5–5.1)
Sodium: 139 mmol/L (ref 135–145)

## 2015-11-01 NOTE — Progress Notes (Signed)
Pt was alert. Gave brief answers to a few probing questions. No prayer requested.   11/01/15 1100  Clinical Encounter Type  Visited With Patient  Visit Type Initial  Referral From Nurse  Spiritual Encounters  Spiritual Needs Emotional  Stress Factors  Patient Stress Factors Exhausted

## 2015-11-01 NOTE — Evaluation (Signed)
Physical Therapy Evaluation Patient Details Name: Genni Carlock MRN: [id number] DOB: [birth year] Today's Date: 11/01/2015   History of Present Illness  Pt is a 75 y/o F who presented with abdominal pain and bright red blood per rectum.  Pt ambulated to the bathroom after discovering this and experienced a syncopal episode and sliding off the toilet.  Pt's PMH includes type II diabetes, chronic back pain, Addison's disease, L ankle fracture, cancer, Bipolar depression, DVT, L THA.      Clinical Impression  Pt admitted with above diagnosis. Pt currently with functional limitations due to the deficits listed below (see PT Problem List). Ms. Holstine presents with limiting abdominal pain.  She was agreeable to transfer to chair and to sit in chair today but refuses on multiple occasions to ambulate in room or hallway due to abdominal pain, despite extensive education provided on the benefits of mobility and potential consequences of delaying mobility.  She was Ind using quad cane and RW PTA and lives alone.  Anticipate that once pt's pain is well controlled she will progress quickly with mobility.  Pt would benefit from OPPT at d/c to address imbalance and R ankle pain.  Pt will benefit from skilled PT to increase their independence and safety with mobility to allow discharge to the venue listed below.      Follow Up Recommendations Outpatient PT    Equipment Recommendations  None recommended by PT    Recommendations for Other Services       Precautions / Restrictions Precautions Precautions: Fall;Other (comment) Precaution Comments: Monitor O2 Restrictions Weight Bearing Restrictions: No      Mobility  Bed Mobility Overal bed mobility: Modified Independent             General bed mobility comments: Cues for log roll and pt requires increased time  Transfers Overall transfer level: Needs assistance Equipment used: Rolling walker (2 wheeled) Transfers: Sit to/from Stand Sit  to Stand: Supervision         General transfer comment: Supervision for safety and cues for hand placement.  Ambulation/Gait Ambulation/Gait assistance: Modified independent (Device/Increase time) Ambulation Distance (Feet): 3 Feet Assistive device: Rolling walker (2 wheeled) Gait Pattern/deviations: Step-through pattern;Decreased stride length;Trunk flexed Gait velocity: decreased   General Gait Details: Trunk flexed due to abdominal pain and pt takes a few steps to chair.  She declines ambulating in room or hallway on multiple occasions despite education provided on importance of mobility.  Stairs            Wheelchair Mobility    Modified Rankin (Stroke Patients Only)       Balance Overall balance assessment: History of Falls;Needs assistance (fall due to syncope PTA and fall and pelvic fx in 02/2015) Sitting-balance support: No upper extremity supported;Feet supported Sitting balance-Leahy Scale: Good     Standing balance support: No upper extremity supported;During functional activity Standing balance-Leahy Scale: Fair Standing balance comment: Pt able to statically stand without UE support                             Pertinent Vitals/Pain Pain Assessment: 0-10 Pain Score: 9  Pain Location: abdomen Pain Descriptors / Indicators: Grimacing;Guarding Pain Intervention(s): Limited activity within patient's tolerance;Monitored during session;Repositioned    Home Living Family/patient expects to be discharged to:: Private residence Living Arrangements: Alone Available Help at Discharge: Friend(s);Family;Available PRN/intermittently Type of Home: House Home Access: Stairs to enter Entrance Stairs-Rails: Can reach both;Left;Right Entrance Stairs-Number  of Steps: 3 Home Layout: One level Home Equipment: [place] - 2 wheels;Cane - quad;Shower seat;Bedside commode      Prior Function Level of Independence: Independent with assistive device(s)          Comments: Ambulates with quad cane inside home and RW in community.      Hand Dominance        Extremity/Trunk Assessment   Upper Extremity Assessment: RUE deficits/detail;LUE deficits/detail RUE Deficits / Details: shoulder F/Abd AROM limited to ~90 deg due to pain, pt reports MD told her she has Bil torn RTC   RUE Sensation:  (WNL) LUE Deficits / Details: shoulder F/Abd AROM limited to ~90 deg due to pain, pt reports MD told her she has Bil torn RTC   Lower Extremity Assessment: RLE deficits/detail;LLE deficits/detail RLE Deficits / Details: Edema R ankle>L, pt reports tendinitis R ankle.  Pain with R ankle DF AROM and TTP anterolateral ankle.  Negative screen for ankle sprain.   LLE Deficits / Details: Min edema L ankle  Cervical / Trunk Assessment: Kyphotic (due to abdominal pain)  Communication   Communication: No difficulties  Cognition Arousal/Alertness: Awake/alert Behavior During Therapy: WFL for tasks assessed/performed Overall Cognitive Status: Within Functional Limits for tasks assessed                      General Comments General comments (skin integrity, edema, etc.): SpO2 drops to 87% on RA, reapplied O2 at 2L via [place] with SpO2 rebounding quickly to 93%    Exercises General Exercises - Upper Extremity Elbow Flexion: AROM;Both;10 reps;Seated General Exercises - Lower Extremity Ankle Circles/Pumps: AROM;Both;20 reps;Seated Long Arc Quad: AROM;Both;20 reps;Seated Hip Flexion/Marching: Both;15 reps;Seated   Assessment/Plan    PT Assessment Patient needs continued PT services  PT Problem List Decreased strength;Decreased activity tolerance;Decreased balance;Decreased knowledge of use of DME;Decreased safety awareness;Cardiopulmonary status limiting activity;Pain          PT Treatment Interventions DME instruction;Gait training;Stair training;Functional mobility training;Therapeutic activities;Therapeutic exercise;Balance training;Neuromuscular  re-education;Patient/family education    PT Goals (Current goals can be found in the Care Plan section)  Acute Rehab PT Goals Patient Stated Goal: decreased pain PT Goal Formulation: With patient Time For Goal Achievement: 11/08/15 Potential to Achieve Goals: Good    Frequency Min 2X/week   Barriers to discharge        Co-evaluation               End of Session Equipment Utilized During Treatment: Gait belt;Oxygen Activity Tolerance: Patient limited by pain Patient left: in chair;with call bell/phone within reach;with chair alarm set Nurse Communication: Mobility status;Other (comment) (SpO2)         Time: [id number] PT Time Calculation (min) (ACUTE ONLY): 40 min   Charges:   PT Evaluation $PT Eval Low Complexity: 1 Procedure PT Treatments $Therapeutic Exercise: 8-22 mins $Therapeutic Activity: 8-22 mins   PT G Codes:       Collie Siad PT, DPT 11/01/2015, 10:54 AM

## 2015-11-01 NOTE — Progress Notes (Signed)
[facility] [place] NAME: Jordan Brewer    MR#:  [id number]  DATE OF BIRTH:  04/24/1940  SUBJECTIVE:  CHIEF COMPLAINT:   Chief Complaint  Patient presents with  . Rectal Bleeding  . Loss of Consciousness     No more BM, have some nausea and pain in abdomen. Not eating much due to this.  REVIEW OF SYSTEMS:  CONSTITUTIONAL: No fever, fatigue or weakness.  EYES: No blurred or double vision.  EARS, NOSE, AND THROAT: No tinnitus or ear pain.  RESPIRATORY: No cough, shortness of breath, wheezing or hemoptysis.  CARDIOVASCULAR: No chest pain, orthopnea, edema.  GASTROINTESTINAL: No nausea, vomiting, diarrhea or abdominal pain.  GENITOURINARY: No dysuria, hematuria.  ENDOCRINE: No polyuria, nocturia,  HEMATOLOGY: No anemia, easy bruising or bleeding SKIN: No rash or lesion. MUSCULOSKELETAL: No joint pain or arthritis.   NEUROLOGIC: No tingling, numbness, weakness.  PSYCHIATRY: No anxiety or depression.   ROS  DRUG ALLERGIES:   Allergies  Allergen Reactions  . Codeine Nausea And Vomiting  . 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 Promethazine Nausea And Vomiting  . Lidoderm [Lidocaine] Rash    VITALS:  Blood pressure 134/60, pulse (!) 105, temperature 98.1 F (36.7 C), temperature source Oral, resp. rate 16, height 5\' 4"  (1.626 m), weight 74.8 kg (165 lb), SpO2 98 %.  PHYSICAL EXAMINATION:  GENERAL:  75 y.o.-year-old patient lying in the bed with no acute distress.  EYES: Pupils equal, round, reactive to light and accommodation. No scleral icterus. Extraocular muscles intact.  HEENT: Head atraumatic, normocephalic. Oropharynx and nasopharynx clear.  NECK:  Supple, no jugular venous distention. No thyroid enlargement, no tenderness.  LUNGS: Normal breath sounds bilaterally, no wheezing, rales,rhonchi or crepitation. No use of accessory muscles of respiration.  CARDIOVASCULAR: S1, S2 normal. No  murmurs, rubs, or gallops.  ABDOMEN: Soft, nontender, distended. Bowel sounds present. No organomegaly or mass.  EXTREMITIES: No pedal edema, cyanosis, or clubbing.  NEUROLOGIC: Cranial nerves II through XII are intact. Muscle strength 5/5 in all extremities. Sensation intact. Gait not checked.  PSYCHIATRIC: The patient is alert and oriented x 3.  SKIN: No obvious rash, lesion, or ulcer.   Physical Exam LABORATORY PANEL:   CBC  Recent Labs Lab 11/01/15 0444  WBC 10.5  HGB 10.7*  HCT 33.1*  PLT 275   ------------------------------------------------------------------------------------------------------------------  Chemistries   Recent Labs Lab 10/30/15 1103  11/01/15 0444  NA 139  < > 139  K 3.4*  < > 4.1  CL 104  < > 109  CO2 23  < > 24  GLUCOSE 231*  < > 162*  BUN 18  < > 8  CREATININE 1.22*  < > 0.93  CALCIUM 9.7  < > 8.6*  AST 19  --   --   ALT 12*  --   --   ALKPHOS 84  --   --   BILITOT 0.6  --   --   < > = values in this interval not displayed. ------------------------------------------------------------------------------------------------------------------  Cardiac Enzymes  Recent Labs Lab 10/30/15 1103  TROPONINI <0.03   ------------------------------------------------------------------------------------------------------------------  RADIOLOGY:  Dg Abd 1 View  Result Date: 11/01/2015 CLINICAL DATA:  Abdominal distension EXAM: ABDOMEN - 1 VIEW COMPARISON:  None. FINDINGS: There is normal small bowel gas pattern. Contrast material noted in right colon. Moderate gas noted within redundant transverse colon. IMPRESSION: Normal small bowel gas pattern. Moderate gas noted within  redundant transverse colon. Electronically Signed   By: Lahoma Crocker M.D.   On: 11/01/2015 12:27    ASSESSMENT AND PLAN:   Active Problems:   Colitis   Sepsis (Progress)  1. Sepsis present on admission secondary to Colitis: HR, WBC,     Cipro Flagyl transition to oral , follow CBC  and transfuse hemoglobin less than 7    Waiting to collect GI panel and C diff- pt did not have any BM since yesterday , so d/c c diff precaution.   Still have c/o abdominal pain and nausea, so get Xray KUB and monitor. 2. Syncope: Situational, checked orthostatic vital signs - severe rise in HR on standing , fluid hydration 2. Type 2 diabetes insulin requiring: Continue basal insulin and sliding scale coverage 3. Essential hypertension: Norvasc, lisinopril 4. GERD without esophagitis PPI therapy 5. Hyperlipidemia unspecified: Statin therapy  PT suggest out pt follow up.  All the records are reviewed and case discussed with Care Management/Social Workerr. Management plans discussed with the patient, family and they are in agreement.  CODE STATUS: full.  TOTAL TIME TAKING CARE OF THIS PATIENT: 35 minutes.    POSSIBLE D/C IN 1-2 DAYS, DEPENDING ON CLINICAL CONDITION.   Vaughan Basta M.D on 11/01/2015   Between 7am to 6pm - Pager - [phone]  After 6pm go to [email or web address] - password [email or web address] [facility]  Office  [phone]  CC: Primary care physician; Tracie Harrier, MD  Note: This dictation was prepared with Dragon dictation along with smaller phrase technology. Any transcriptional errors that result from this process are unintentional.

## 2015-11-02 DIAGNOSIS — Z23 Encounter for immunization: Secondary | ICD-10-CM | POA: Diagnosis not present

## 2015-11-02 LAB — GLUCOSE, CAPILLARY
GLUCOSE-CAPILLARY: 138 mg/dL — AB (ref 65–99)
Glucose-Capillary: 146 mg/dL — ABNORMAL HIGH (ref 65–99)

## 2015-11-02 MED ORDER — ALUM & MAG HYDROXIDE-SIMETH 200-200-20 MG/5ML PO SUSP: 30.0000 mL | Freq: Four times a day (QID) | ORAL | 0 refills | Status: DC | PRN

## 2015-11-02 MED ORDER — ALUM & MAG HYDROXIDE-SIMETH 200-200-20 MG/5ML PO SUSP
30.0000 mL | Freq: Four times a day (QID) | ORAL | Status: DC | PRN
  Administered 2015-11-02: 30 mL via ORAL
  Filled 2015-11-02: qty 30

## 2015-11-02 MED ORDER — CIPROFLOXACIN HCL 500 MG PO TABS: 500.0000 mg | ORAL_TABLET | Freq: Two times a day (BID) | ORAL | 0 refills | Status: DC

## 2015-11-02 MED ORDER — METRONIDAZOLE 500 MG PO TABS: 500.0000 mg | ORAL_TABLET | Freq: Three times a day (TID) | ORAL | 0 refills | Status: DC

## 2015-11-02 NOTE — Discharge Summary (Signed)
[facility] [place] NAME: Jordan Brewer    MR#:  [id number]  DATE OF BIRTH:  1940-07-10  DATE OF ADMISSION:  10/30/2015 ADMITTING PHYSICIAN: Lytle Butte, MD  DATE OF DISCHARGE: 11/02/2015  PRIMARY CARE PHYSICIAN: Tracie Harrier, MD    ADMISSION DIAGNOSIS:  Syncope and collapse [R55] Colitis [K52.9] Lower GI bleed [K92.2]  DISCHARGE DIAGNOSIS:  Active Problems:   Colitis   Sepsis ([place])   SECONDARY DIAGNOSIS:   Past Medical History:  Diagnosis Date  . Addison's disease ([place])   . Ankle fracture, left 02/2012  . Arthritis    "all over my body" (04/10/2012)  . Bipolar depression ([place])   . Cancer ([place])   . Chronic back pain    "since my hip OR in 09/2010"  . Depression   . DVT, lower extremity ([place]) 09/2010   "left; after hip replacement" (04/10/2012)  . Fall at home 02/2012   'foot got caught on heating pad cord; broke left ankle" (04/10/2012)  . GERD (gastroesophageal reflux disease)   . History of blood transfusion   . Hypercholesteremia   . Hypertension   . Syncope and collapse 04/10/2012   "w/loss of consciousness" (04/10/2012)  . Type II diabetes mellitus ([place])     HOSPITAL COURSE:   1. Sepsis present on admission secondary to Colitis: HR, WBC,    Cipro Flagyl transition to oral , follow CBC and stable Hgb    Waiting to collect GI panel and C diff- pt did not have any BM , so d/c c diff precaution.    Diarrhea resolved, but she had some c/o nausea and abdominal discomfort, Xray KUB without any acute fondings- likely now the complain is due to Abx use. Give maalox. 2. Syncope:  Orthostatic changes in vitals, Situational, checked orthostatic vital signs - severe rise in HR on standing , fluid hydration, improved 2. Type 2 diabetes insulin requiring: Continue basal insulin and sliding scale coverage 3. Essential hypertension: Norvasc, lisinopril 4. GERD without esophagitis PPI therapy 5. Hyperlipidemia unspecified:  Statin therapy  DISCHARGE CONDITIONS:   Stable.  CONSULTS OBTAINED:  Treatment Team:  Lytle Butte, MD  DRUG ALLERGIES:   Allergies  Allergen Reactions  . Codeine Nausea And Vomiting  . 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 Promethazine Nausea And Vomiting  . Lidoderm [Lidocaine] Rash    DISCHARGE MEDICATIONS:   Current Discharge Medication List    START taking these medications   Details  alum & mag hydroxide-simeth (MAALOX/MYLANTA) 200-200-20 MG/5ML suspension Take 30 mLs by mouth every 6 (six) hours as needed for indigestion or heartburn. Qty: 355 mL, Refills: 0    ciprofloxacin (CIPRO) 500 MG tablet Take 1 tablet (500 mg total) by mouth 2 (two) times daily. For 4 days Qty: 8 tablet, Refills: 0    metroNIDAZOLE (FLAGYL) 500 MG tablet Take 1 tablet (500 mg total) by mouth every 8 (eight) hours. Qty: 15 tablet, Refills: 0      CONTINUE these medications which have NOT CHANGED   Details  amLODipine (NORVASC) 10 MG tablet Take 10 mg by mouth daily.    docusate sodium (COLACE) 100 MG capsule Take 1 capsule (100 mg total) by mouth 2 (two) times daily. Qty: 10 capsule, Refills: 0    DULoxetine (CYMBALTA) 60 MG capsule Take 60 mg by mouth daily.    fludrocortisone (FLORINEF) 0.1 MG tablet Take 0.1 mg by mouth daily.  hydrocortisone (CORTEF) 10 MG tablet Take 10-20 mg by mouth 2 (two) times daily. 20 mg every morning and 10 mg at bedtime    insulin aspart (NOVOLOG) 100 UNIT/ML injection Inject into the skin 3 (three) times daily before meals. Per sliding scale    insulin glargine (LANTUS) 100 unit/mL SOPN Inject 10 Units into the skin at bedtime.    lisinopril (PRINIVIL,ZESTRIL) 20 MG tablet Take 20 mg by mouth 2 (two) times daily.    lovastatin (MEVACOR) 40 MG tablet Take 40 mg by mouth at bedtime.    omeprazole (PRILOSEC) 40 MG capsule Take 40 mg by mouth 2 (two) times daily.    oxyCODONE-acetaminophen (PERCOCET/ROXICET)  5-325 MG tablet Take 1 tablet by mouth daily as needed for severe pain.     Polyethyl Glycol-Propyl Glycol (SYSTANE OP) Apply 1 drop to eye daily as needed. For burning/irritated eyes    TRULICITY [id number] SOPN Inject 0.5 mLs into the skin every Wednesday.         DISCHARGE INSTRUCTIONS:    Follow with PMD in 1-2 weeks.  If you experience worsening of your admission symptoms, develop shortness of breath, life threatening emergency, suicidal or homicidal thoughts you must seek medical attention immediately by calling 911 or calling your MD immediately  if symptoms less severe.  You Must read complete instructions/literature along with all the possible adverse reactions/side effects for all the Medicines you take and that have been prescribed to you. Take any new Medicines after you have completely understood and accept all the possible adverse reactions/side effects.   Please note  You were cared for by a hospitalist during your hospital stay. If you have any questions about your discharge medications or the care you received while you were in the hospital after you are discharged, you can call the unit and asked to speak with the hospitalist on call if the hospitalist that took care of you is not available. Once you are discharged, your primary care physician will handle any further medical issues. Please note that NO REFILLS for any discharge medications will be authorized once you are discharged, as it is imperative that you return to your primary care physician (or establish a relationship with a primary care physician if you do not have one) for your aftercare needs so that they can reassess your need for medications and monitor your lab values.    Today   CHIEF COMPLAINT:   Chief Complaint  Patient presents with  . Rectal Bleeding  . Loss of Consciousness    HISTORY OF PRESENT ILLNESS:  Jordan Brewer  is a 75 y.o. female with a known history of Type 2 diabetes insulin  requiring who is presenting with abdominal pain and bright red blood per rectum. She describes one day duration intermittent abdominal pain left lower quadrant crampy in quality 6/10 intensity and no worsening or relieving factors associated bright red blood per rectum 1 She states while walking to the bathroom made it to the toilet and suffered a syncopal episode while sitting on the toilet.   VITAL SIGNS:  Blood pressure 120/68, pulse (!) 107, temperature 98.4 F (36.9 C), temperature source Oral, resp. rate (!) 21, height 5\' 4"  (1.626 m), weight 82.8 kg (182 lb 9.6 oz), SpO2 96 %.  I/O:   Intake/Output Summary (Last 24 hours) at 11/02/15 0951 Last data filed at 11/02/15 0750  Gross per 24 hour  Intake             1470 ml  Output             1950 ml  Net             -480 ml    PHYSICAL EXAMINATION:   GENERAL:  75 y.o.-year-old patient lying in the bed with no acute distress.  EYES: Pupils equal, round, reactive to light and accommodation. No scleral icterus. Extraocular muscles intact.  HEENT: Head atraumatic, normocephalic. Oropharynx and nasopharynx clear.  NECK:  Supple, no jugular venous distention. No thyroid enlargement, no tenderness.  LUNGS: Normal breath sounds bilaterally, no wheezing, rales,rhonchi or crepitation. No use of accessory muscles of respiration.  CARDIOVASCULAR: S1, S2 normal. No murmurs, rubs, or gallops.  ABDOMEN: Soft, nontender, distended. Bowel sounds present. No organomegaly or mass.  EXTREMITIES: No pedal edema, cyanosis, or clubbing.  NEUROLOGIC: Cranial nerves II through XII are intact. Muscle strength 5/5 in all extremities. Sensation intact. Gait not checked.  PSYCHIATRIC: The patient is alert and oriented x 3.  SKIN: No obvious rash, lesion, or ulcer.   DATA REVIEW:   CBC  Recent Labs Lab 11/01/15 0444  WBC 10.5  HGB 10.7*  HCT 33.1*  PLT 275    Chemistries   Recent Labs Lab 10/30/15 1103  11/01/15 0444  NA 139  < > 139  K 3.4*   < > 4.1  CL 104  < > 109  CO2 23  < > 24  GLUCOSE 231*  < > 162*  BUN 18  < > 8  CREATININE 1.22*  < > 0.93  CALCIUM 9.7  < > 8.6*  AST 19  --   --   ALT 12*  --   --   ALKPHOS 84  --   --   BILITOT 0.6  --   --   < > = values in this interval not displayed.  Cardiac Enzymes  Recent Labs Lab 10/30/15 1103  TROPONINI <0.03    Microbiology Results  Results for orders placed or performed during the hospital encounter of 10/30/15  CULTURE, BLOOD (ROUTINE X 2) w Reflex to ID Panel     Status: None (Preliminary result)   Collection Time: 10/30/15  1:53 PM  Result Value Ref Range Status   Specimen Description BLOOD LEFT ASSIST CONTROL  Final   Special Requests BOTTLES DRAWN AEROBIC AND ANAEROBIC 5CC  Final   Culture NO GROWTH 3 DAYS  Final   Report Status PENDING  Incomplete  CULTURE, BLOOD (ROUTINE X 2) w Reflex to ID Panel     Status: None (Preliminary result)   Collection Time: 10/30/15  1:53 PM  Result Value Ref Range Status   Specimen Description BLOOD LEFT HAND  Final   Special Requests BOTTLES DRAWN AEROBIC AND ANAEROBIC 5CC  Final   Culture NO GROWTH 3 DAYS  Final   Report Status PENDING  Incomplete    RADIOLOGY:  Dg Abd 1 View  Result Date: 11/01/2015 CLINICAL DATA:  Abdominal distension EXAM: ABDOMEN - 1 VIEW COMPARISON:  None. FINDINGS: There is normal small bowel gas pattern. Contrast material noted in right colon. Moderate gas noted within redundant transverse colon. IMPRESSION: Normal small bowel gas pattern. Moderate gas noted within redundant transverse colon. Electronically Signed   By: Lahoma Crocker M.D.   On: 11/01/2015 12:27    EKG:   Orders placed or performed during the hospital encounter of 11/13/14  . ED EKG  . ED EKG  . EKG 12-Lead  . EKG 12-Lead      Management  plans discussed with the patient, family and they are in agreement.  CODE STATUS:     Code Status Orders        Start     Ordered   10/30/15 1319  Full code  Continuous      10/30/15 1320    Code Status History    Date Active Date Inactive Code Status Order ID Comments User Context   11/13/2014  5:38 PM 11/16/2014  7:20 PM Full Code [id number]  Nicholes Mango, MD Inpatient   04/10/2012  6:13 PM 04/11/2012  9:57 PM Full Code [id number]  Charlynne Cousins, MD Inpatient      TOTAL TIME TAKING CARE OF THIS PATIENT: 35 minutes.    Vaughan Basta M.D on 11/02/2015 at 9:51 AM  Between 7am to 6pm - Pager - [phone]  After 6pm go to [email or web address] - password [email or web address] [facility]  Office  [phone]  CC: Primary care physician; Tracie Harrier, MD   Note: This dictation was prepared with Dragon dictation along with smaller phrase technology. Any transcriptional errors that result from this process are unintentional.

## 2015-11-02 NOTE — Discharge Instructions (Signed)
°  Colitis °Colitis is inflammation of the colon. Colitis may last a short time (acute) or it may last a long time (chronic). °CAUSES °This condition may be caused by: °· Viruses. °· Bacteria. °· Reactions to medicine. °· Certain autoimmune diseases, such as Crohn disease or ulcerative colitis. °SYMPTOMS °Symptoms of this condition include: °· Diarrhea. °· Passing bloody or tarry stool. °· Pain. °· Fever. °· Vomiting. °· Tiredness (fatigue). °· Weight loss. °· Bloating. °· Sudden increase in abdominal pain. °· Having fewer bowel movements than usual. °DIAGNOSIS °This condition is diagnosed with a stool test or a blood test. You may also have other tests, including X-rays, a CT scan, or a colonoscopy. °TREATMENT °Treatment may include: °· Resting the bowel. This involves not eating or drinking for a period of time. °· Fluids that are given through an IV tube. °· Medicine for pain and diarrhea. °· Antibiotic medicines. °· Cortisone medicines. °· Surgery. °HOME CARE INSTRUCTIONS °Eating and Drinking °· Follow instructions from your health care provider about eating or drinking restrictions. °· Drink enough fluid to keep your urine clear or pale yellow. °· Work with a dietitian to determine which foods cause your condition to flare up. °· Avoid foods that cause flare-ups. °· Eat a well-balanced diet. °Medicines °· Take over-the-counter and prescription medicines only as told by your health care provider. °· If you were prescribed an antibiotic medicine, take it as told by your health care provider. Do not stop taking the antibiotic even if you start to feel better. °General Instructions °· Keep all follow-up visits as told by your health care provider. This is important. °SEEK MEDICAL CARE IF: °· Your symptoms do not go away. °· You develop new symptoms. °SEEK IMMEDIATE MEDICAL CARE IF: °· You have a fever that does not go away with treatment. °· You develop chills. °· You have extreme weakness, fainting, or  dehydration. °· You have repeated vomiting. °· You develop severe pain in your abdomen. °· You pass bloody or tarry stool. °  °This information is not intended to replace advice given to you by your health care provider. Make sure you discuss any questions you have with your health care provider. °  °Document Released: 02/02/2004 Document Revised: 09/15/2014 Document Reviewed: 04/19/2014 °Elsevier Interactive Patient Education ©2016 [employer]. ° °

## 2015-11-02 NOTE — Progress Notes (Signed)
Patient has orders to ambulate in hallway.  I have encouraged patient to ambulate several times throughout the night but patient refuses because she wants to sleep and doesn't want people watching her ambulate.  Christene Slates  11/02/2015  6:28 AM

## 2015-11-02 NOTE — Progress Notes (Signed)
11/02/2015 2:12 PM  Ulyess Blossom to be D/C'd Home per MD order.  Discussed prescriptions and follow up appointments with the patient. Prescriptions given to patient, medication list explained in detail. Pt verbalized understanding.    Medication List    TAKE these medications   alum & mag hydroxide-simeth 200-200-20 MG/5ML suspension Commonly known as:  MAALOX/MYLANTA Take 30 mLs by mouth every 6 (six) hours as needed for indigestion or heartburn.   amLODipine 10 MG tablet Commonly known as:  NORVASC Take 10 mg by mouth daily.   ciprofloxacin 500 MG tablet Commonly known as:  CIPRO Take 1 tablet (500 mg total) by mouth 2 (two) times daily. For 4 days   docusate sodium 100 MG capsule Commonly known as:  COLACE Take 1 capsule (100 mg total) by mouth 2 (two) times daily. What changed:  when to take this  reasons to take this   DULoxetine 60 MG capsule Commonly known as:  CYMBALTA Take 60 mg by mouth daily.   fludrocortisone 0.1 MG tablet Commonly known as:  FLORINEF Take 0.1 mg by mouth daily.   hydrocortisone 10 MG tablet Commonly known as:  CORTEF Take 10-20 mg by mouth 2 (two) times daily. 20 mg every morning and 10 mg at bedtime   insulin aspart 100 UNIT/ML injection Commonly known as:  novoLOG Inject into the skin 3 (three) times daily before meals. Per sliding scale   insulin glargine 100 unit/mL Sopn Commonly known as:  LANTUS Inject 10 Units into the skin at bedtime.   lisinopril 20 MG tablet Commonly known as:  PRINIVIL,ZESTRIL Take 20 mg by mouth 2 (two) times daily.   lovastatin 40 MG tablet Commonly known as:  MEVACOR Take 40 mg by mouth at bedtime.   metroNIDAZOLE 500 MG tablet Commonly known as:  FLAGYL Take 1 tablet (500 mg total) by mouth every 8 (eight) hours.   omeprazole 40 MG capsule Commonly known as:  PRILOSEC Take 40 mg by mouth 2 (two) times daily.   oxyCODONE-acetaminophen 5-325 MG tablet Commonly known as:   PERCOCET/ROXICET Take 1 tablet by mouth daily as needed for severe pain.   SYSTANE OP Apply 1 drop to eye daily as needed. For burning/irritated eyes   TRULICITY [id number] Sopn Generic drug:  Dulaglutide Inject 0.5 mLs into the skin every Wednesday.       Vitals:   11/02/15 0423 11/02/15 1343  BP: 120/68 (!) 142/83  Pulse: (!) 107 (!) 120  Resp: (!) 21 16  Temp: 98.4 F (36.9 C) 98.3 F (36.8 C)    Skin clean, dry and intact without evidence of skin break down, no evidence of skin tears noted. IV catheter discontinued intact. Site without signs and symptoms of complications. Dressing and pressure applied. Pt denies pain at this time. No complaints noted.  An After Visit Summary was printed and given to the patient. Patient escorted via [place], and D/C home via private auto.  Dola Argyle

## 2015-11-02 NOTE — Care Management Important Message (Signed)
Important Message  Patient Details  Name: Jordan Brewer MRN: [id number] Date of Birth: December 02, 1940   Medicare Important Message Given:  Yes    Beverly Sessions, RN 11/02/2015, 10:51 AM

## 2015-11-02 NOTE — Progress Notes (Signed)
PT Cancellation Note  Patient Details Name: [name] MRN: [id number] DOB: [birth year]   Cancelled Treatment:    Reason Eval/Treat Not Completed: Patient declined, no reason specified.  Pt declines working with therapy saying, "I really don't feel well today".  Pt continues to refuse therapy despite education provided on benefits of mobility and potential consequences of delaying mobility.  Will attempt to see pt again later today, schedule permitting.   Collie Siad PT, DPT 11/02/2015, 11:40 AM

## 2015-11-04 LAB — CULTURE, BLOOD (ROUTINE X 2)
Culture: NO GROWTH
Culture: NO GROWTH

## 2015-11-07 ENCOUNTER — Emergency Department: Payer: Commercial Managed Care - HMO

## 2015-11-07 ENCOUNTER — Encounter: Payer: Self-pay | Admitting: Emergency Medicine

## 2015-11-07 ENCOUNTER — Observation Stay: Payer: Commercial Managed Care - HMO

## 2015-11-07 ENCOUNTER — Inpatient Hospital Stay
Admit: 2015-11-07 | Discharge: 2015-11-07 | Disposition: A | Discharge status: Home / Self Care | Payer: Commercial Managed Care - HMO | Attending: Internal Medicine | Admitting: Internal Medicine

## 2015-11-07 ENCOUNTER — Observation Stay
Admission: EM | Admit: 2015-11-07 | Discharge: 2015-11-11 | Disposition: A | Discharge status: Home / Self Care | Payer: Commercial Managed Care - HMO | Attending: Internal Medicine | Admitting: Internal Medicine

## 2015-11-07 DIAGNOSIS — B370 Candidal stomatitis: Secondary | ICD-10-CM | POA: Insufficient documentation

## 2015-11-07 DIAGNOSIS — S0990XA Unspecified injury of head, initial encounter: Secondary | ICD-10-CM | POA: Diagnosis not present

## 2015-11-07 DIAGNOSIS — M549 Dorsalgia, unspecified: Secondary | ICD-10-CM | POA: Insufficient documentation

## 2015-11-07 DIAGNOSIS — Y92002 Bathroom of unspecified non-institutional (private) residence single-family (private) house as the place of occurrence of the external cause: Secondary | ICD-10-CM | POA: Insufficient documentation

## 2015-11-07 DIAGNOSIS — K219 Gastro-esophageal reflux disease without esophagitis: Secondary | ICD-10-CM | POA: Insufficient documentation

## 2015-11-07 DIAGNOSIS — E876 Hypokalemia: Secondary | ICD-10-CM | POA: Insufficient documentation

## 2015-11-07 DIAGNOSIS — I129 Hypertensive chronic kidney disease with stage 1 through stage 4 chronic kidney disease, or unspecified chronic kidney disease: Secondary | ICD-10-CM | POA: Diagnosis not present

## 2015-11-07 DIAGNOSIS — I6523 Occlusion and stenosis of bilateral carotid arteries: Secondary | ICD-10-CM | POA: Diagnosis not present

## 2015-11-07 DIAGNOSIS — Z801 Family history of malignant neoplasm of trachea, bronchus and lung: Secondary | ICD-10-CM | POA: Insufficient documentation

## 2015-11-07 DIAGNOSIS — E7800 Pure hypercholesterolemia, unspecified: Secondary | ICD-10-CM | POA: Insufficient documentation

## 2015-11-07 DIAGNOSIS — W182XXA Fall in (into) shower or empty bathtub, initial encounter: Secondary | ICD-10-CM | POA: Diagnosis not present

## 2015-11-07 DIAGNOSIS — S2231XA Fracture of one rib, right side, initial encounter for closed fracture: Secondary | ICD-10-CM | POA: Diagnosis not present

## 2015-11-07 DIAGNOSIS — I071 Rheumatic tricuspid insufficiency: Secondary | ICD-10-CM | POA: Insufficient documentation

## 2015-11-07 DIAGNOSIS — G8929 Other chronic pain: Secondary | ICD-10-CM | POA: Insufficient documentation

## 2015-11-07 DIAGNOSIS — E878 Other disorders of electrolyte and fluid balance, not elsewhere classified: Secondary | ICD-10-CM | POA: Insufficient documentation

## 2015-11-07 DIAGNOSIS — Q2546 Tortuous aortic arch: Secondary | ICD-10-CM | POA: Diagnosis not present

## 2015-11-07 DIAGNOSIS — R55 Syncope and collapse: Principal | ICD-10-CM | POA: Diagnosis present

## 2015-11-07 DIAGNOSIS — Z884 Allergy status to anesthetic agent status: Secondary | ICD-10-CM | POA: Insufficient documentation

## 2015-11-07 DIAGNOSIS — Z96642 Presence of left artificial hip joint: Secondary | ICD-10-CM | POA: Insufficient documentation

## 2015-11-07 DIAGNOSIS — Z794 Long term (current) use of insulin: Secondary | ICD-10-CM | POA: Insufficient documentation

## 2015-11-07 DIAGNOSIS — N189 Chronic kidney disease, unspecified: Secondary | ICD-10-CM | POA: Insufficient documentation

## 2015-11-07 DIAGNOSIS — S0101XA Laceration without foreign body of scalp, initial encounter: Secondary | ICD-10-CM | POA: Insufficient documentation

## 2015-11-07 DIAGNOSIS — Y998 Other external cause status: Secondary | ICD-10-CM | POA: Diagnosis not present

## 2015-11-07 DIAGNOSIS — I700 Atherosclerosis of aorta: Secondary | ICD-10-CM | POA: Insufficient documentation

## 2015-11-07 DIAGNOSIS — S199XXA Unspecified injury of neck, initial encounter: Secondary | ICD-10-CM | POA: Diagnosis not present

## 2015-11-07 DIAGNOSIS — S2239XA Fracture of one rib, unspecified side, initial encounter for closed fracture: Secondary | ICD-10-CM | POA: Diagnosis not present

## 2015-11-07 DIAGNOSIS — Z66 Do not resuscitate: Secondary | ICD-10-CM | POA: Insufficient documentation

## 2015-11-07 DIAGNOSIS — E1122 Type 2 diabetes mellitus with diabetic chronic kidney disease: Secondary | ICD-10-CM | POA: Diagnosis not present

## 2015-11-07 DIAGNOSIS — E271 Primary adrenocortical insufficiency: Secondary | ICD-10-CM | POA: Diagnosis not present

## 2015-11-07 DIAGNOSIS — Y93E1 Activity, personal bathing and showering: Secondary | ICD-10-CM | POA: Insufficient documentation

## 2015-11-07 DIAGNOSIS — Z833 Family history of diabetes mellitus: Secondary | ICD-10-CM | POA: Insufficient documentation

## 2015-11-07 DIAGNOSIS — R51 Headache: Secondary | ICD-10-CM | POA: Diagnosis not present

## 2015-11-07 DIAGNOSIS — S2222XA Fracture of body of sternum, initial encounter for closed fracture: Secondary | ICD-10-CM

## 2015-11-07 DIAGNOSIS — I10 Essential (primary) hypertension: Secondary | ICD-10-CM | POA: Diagnosis not present

## 2015-11-07 DIAGNOSIS — Z8249 Family history of ischemic heart disease and other diseases of the circulatory system: Secondary | ICD-10-CM | POA: Insufficient documentation

## 2015-11-07 DIAGNOSIS — W1830XA Fall on same level, unspecified, initial encounter: Secondary | ICD-10-CM | POA: Diagnosis not present

## 2015-11-07 DIAGNOSIS — R262 Difficulty in walking, not elsewhere classified: Secondary | ICD-10-CM

## 2015-11-07 DIAGNOSIS — M1389 Other specified arthritis, multiple sites: Secondary | ICD-10-CM | POA: Diagnosis not present

## 2015-11-07 DIAGNOSIS — Q331 Accessory lobe of lung: Secondary | ICD-10-CM | POA: Insufficient documentation

## 2015-11-07 DIAGNOSIS — F319 Bipolar disorder, unspecified: Secondary | ICD-10-CM | POA: Insufficient documentation

## 2015-11-07 DIAGNOSIS — E860 Dehydration: Secondary | ICD-10-CM | POA: Diagnosis not present

## 2015-11-07 DIAGNOSIS — Z888 Allergy status to other drugs, medicaments and biological substances status: Secondary | ICD-10-CM | POA: Insufficient documentation

## 2015-11-07 DIAGNOSIS — I2510 Atherosclerotic heart disease of native coronary artery without angina pectoris: Secondary | ICD-10-CM | POA: Diagnosis not present

## 2015-11-07 DIAGNOSIS — Z90710 Acquired absence of both cervix and uterus: Secondary | ICD-10-CM | POA: Insufficient documentation

## 2015-11-07 DIAGNOSIS — M542 Cervicalgia: Secondary | ICD-10-CM | POA: Diagnosis not present

## 2015-11-07 DIAGNOSIS — R079 Chest pain, unspecified: Secondary | ICD-10-CM | POA: Diagnosis not present

## 2015-11-07 DIAGNOSIS — M6281 Muscle weakness (generalized): Secondary | ICD-10-CM

## 2015-11-07 DIAGNOSIS — Z885 Allergy status to narcotic agent status: Secondary | ICD-10-CM | POA: Insufficient documentation

## 2015-11-07 LAB — CBC WITH DIFFERENTIAL/PLATELET
BASOS ABS: 0.1 10*3/uL (ref 0–0.1)
Basophils Relative: 1 %
EOS ABS: 0.1 10*3/uL (ref 0–0.7)
EOS PCT: 1 %
HCT: 38.3 % (ref 35.0–47.0)
HEMOGLOBIN: 12.5 g/dL (ref 12.0–16.0)
LYMPHS PCT: 19 %
Lymphs Abs: 1.9 10*3/uL (ref 1.0–3.6)
MCH: 25.5 pg — ABNORMAL LOW (ref 26.0–34.0)
MCHC: 32.5 g/dL (ref 32.0–36.0)
MCV: 78.4 fL — ABNORMAL LOW (ref 80.0–100.0)
Monocytes Absolute: 0.8 10*3/uL (ref 0.2–0.9)
Monocytes Relative: 8 %
NEUTROS PCT: 71 %
Neutro Abs: 7.3 10*3/uL — ABNORMAL HIGH (ref 1.4–6.5)
PLATELETS: 452 10*3/uL — AB (ref 150–440)
RBC: 4.88 MIL/uL (ref 3.80–5.20)
RDW: 18.2 % — ABNORMAL HIGH (ref 11.5–14.5)
WBC: 10.1 10*3/uL (ref 3.6–11.0)

## 2015-11-07 LAB — URINALYSIS COMPLETE WITH MICROSCOPIC (ARMC ONLY)
BACTERIA UA: NONE SEEN
Bilirubin Urine: NEGATIVE
Glucose, UA: >500 mg/dL — AB
Hgb urine dipstick: NEGATIVE
Nitrite: NEGATIVE
PH: 6 (ref 5.0–8.0)
PROTEIN: NEGATIVE mg/dL
Specific Gravity, Urine: >1.06 — ABNORMAL HIGH (ref 1.005–1.030)

## 2015-11-07 LAB — COMPREHENSIVE METABOLIC PANEL
ALT: 13 U/L — AB (ref 14–54)
AST: 25 U/L (ref 15–41)
Albumin: 3.8 g/dL (ref 3.5–5.0)
Alkaline Phosphatase: 71 U/L (ref 38–126)
Anion gap: 11 (ref 5–15)
BUN: 8 mg/dL (ref 6–20)
CHLORIDE: 102 mmol/L (ref 101–111)
CO2: 24 mmol/L (ref 22–32)
CREATININE: 0.91 mg/dL (ref 0.44–1.00)
Calcium: 8.9 mg/dL (ref 8.9–10.3)
GFR calc Af Amer: >60 mL/min (ref >60)
GFR calc non Af Amer: >60 mL/min (ref >60)
GLUCOSE: 179 mg/dL — AB (ref 65–99)
Potassium: 2.9 mmol/L — ABNORMAL LOW (ref 3.5–5.1)
SODIUM: 137 mmol/L (ref 135–145)
Total Bilirubin: 0.4 mg/dL (ref 0.3–1.2)
Total Protein: 7 g/dL (ref 6.5–8.1)

## 2015-11-07 LAB — GLUCOSE, CAPILLARY
GLUCOSE-CAPILLARY: 209 mg/dL — AB (ref 65–99)
Glucose-Capillary: 214 mg/dL — ABNORMAL HIGH (ref 65–99)
Glucose-Capillary: 165 mg/dL — ABNORMAL HIGH (ref 65–99)

## 2015-11-07 LAB — MAGNESIUM: MAGNESIUM: 1.4 mg/dL — AB (ref 1.7–2.4)

## 2015-11-07 LAB — TROPONIN I: Troponin I: <0.03 ng/mL (ref <0.03)

## 2015-11-07 MED ORDER — ENOXAPARIN SODIUM 40 MG/0.4ML ~~LOC~~ SOLN
40.0000 mg | SUBCUTANEOUS | Status: DC
  Administered 2015-11-07 – 2015-11-10 (×4): 40 mg via SUBCUTANEOUS
  Filled 2015-11-07 (×4): qty 0.4

## 2015-11-07 MED ORDER — ALUM & MAG HYDROXIDE-SIMETH 200-200-20 MG/5ML PO SUSP: 30.0000 mL | Freq: Four times a day (QID) | ORAL | Status: DC | PRN

## 2015-11-07 MED ORDER — MORPHINE SULFATE (PF) 2 MG/ML IV SOLN
4.0000 mg | Freq: Once | INTRAVENOUS | Status: AC
  Administered 2015-11-07: 4 mg via INTRAVENOUS

## 2015-11-07 MED ORDER — DULAGLUTIDE 0.75 MG/0.5ML ~~LOC~~ SOAJ: 0.5000 mL | SUBCUTANEOUS | Status: DC

## 2015-11-07 MED ORDER — ACETAMINOPHEN 325 MG PO TABS: 650.0000 mg | ORAL_TABLET | Freq: Four times a day (QID) | ORAL | Status: DC | PRN

## 2015-11-07 MED ORDER — HYDROCORTISONE 10 MG PO TABS
10.0000 mg | ORAL_TABLET | Freq: Every day | ORAL | Status: DC
  Administered 2015-11-07 – 2015-11-10 (×4): 10 mg via ORAL
  Filled 2015-11-07 (×4): qty 1

## 2015-11-07 MED ORDER — KETOROLAC TROMETHAMINE 30 MG/ML IJ SOLN
15.0000 mg | Freq: Once | INTRAMUSCULAR | Status: AC
  Administered 2015-11-07: 15 mg via INTRAVENOUS
  Filled 2015-11-07: qty 1

## 2015-11-07 MED ORDER — MORPHINE SULFATE (PF) 2 MG/ML IV SOLN
4.0000 mg | Freq: Once | INTRAVENOUS | Status: AC
  Administered 2015-11-07: 4 mg via INTRAVENOUS
  Filled 2015-11-07: qty 2

## 2015-11-07 MED ORDER — POTASSIUM CHLORIDE IN NACL 20-0.9 MEQ/L-% IV SOLN
INTRAVENOUS | Status: DC
  Administered 2015-11-07: 15:00:00 via INTRAVENOUS
  Filled 2015-11-07 (×2): qty 1000

## 2015-11-07 MED ORDER — ALBUTEROL SULFATE (2.5 MG/3ML) 0.083% IN NEBU: 2.5000 mg | INHALATION_SOLUTION | RESPIRATORY_TRACT | Status: DC | PRN

## 2015-11-07 MED ORDER — DULOXETINE HCL 60 MG PO CPEP
60.0000 mg | ORAL_CAPSULE | Freq: Every day | ORAL | Status: DC
  Administered 2015-11-07 – 2015-11-11 (×5): 60 mg via ORAL
  Filled 2015-11-07 (×5): qty 1

## 2015-11-07 MED ORDER — FLUDROCORTISONE ACETATE 0.1 MG PO TABS
0.1000 mg | ORAL_TABLET | Freq: Every day | ORAL | Status: DC
  Administered 2015-11-07 – 2015-11-11 (×5): 0.1 mg via ORAL
  Filled 2015-11-07 (×5): qty 1

## 2015-11-07 MED ORDER — ACETAMINOPHEN 650 MG RE SUPP: 650.0000 mg | Freq: Four times a day (QID) | RECTAL | Status: DC | PRN

## 2015-11-07 MED ORDER — SODIUM CHLORIDE 0.9 % IV SOLN
Freq: Once | INTRAVENOUS | Status: AC
  Administered 2015-11-07: 11:00:00 via INTRAVENOUS

## 2015-11-07 MED ORDER — ONDANSETRON HCL 4 MG PO TABS
4.0000 mg | ORAL_TABLET | Freq: Four times a day (QID) | ORAL | Status: DC | PRN
Start: 2015-11-07 — End: 2015-11-11

## 2015-11-07 MED ORDER — ONDANSETRON HCL 4 MG/2ML IJ SOLN: 4.0000 mg | Freq: Four times a day (QID) | INTRAMUSCULAR | Status: DC | PRN

## 2015-11-07 MED ORDER — SODIUM CHLORIDE 0.9% FLUSH
3.0000 mL | Freq: Two times a day (BID) | INTRAVENOUS | Status: DC
  Administered 2015-11-07 – 2015-11-11 (×9): 3 mL via INTRAVENOUS

## 2015-11-07 MED ORDER — PRAVASTATIN SODIUM 40 MG PO TABS
40.0000 mg | ORAL_TABLET | Freq: Every day | ORAL | Status: DC
  Administered 2015-11-07 – 2015-11-10 (×4): 40 mg via ORAL
  Filled 2015-11-07 (×4): qty 1

## 2015-11-07 MED ORDER — ONDANSETRON HCL 4 MG/2ML IJ SOLN
4.0000 mg | Freq: Once | INTRAMUSCULAR | Status: AC
  Administered 2015-11-07: 4 mg via INTRAVENOUS
  Filled 2015-11-07: qty 2

## 2015-11-07 MED ORDER — MAGNESIUM SULFATE 4 GM/100ML IV SOLN
4.0000 g | Freq: Once | INTRAVENOUS | Status: AC
  Administered 2015-11-07: 4 g via INTRAVENOUS
  Filled 2015-11-07 (×2): qty 100

## 2015-11-07 MED ORDER — LISINOPRIL 20 MG PO TABS
20.0000 mg | ORAL_TABLET | Freq: Two times a day (BID) | ORAL | Status: DC
  Administered 2015-11-07 – 2015-11-10 (×8): 20 mg via ORAL
  Filled 2015-11-07 (×8): qty 1

## 2015-11-07 MED ORDER — HYDROMORPHONE HCL 1 MG/ML IJ SOLN
0.5000 mg | Freq: Once | INTRAMUSCULAR | Status: AC
  Administered 2015-11-07: 0.5 mg via INTRAVENOUS
  Filled 2015-11-07: qty 1

## 2015-11-07 MED ORDER — AMLODIPINE BESYLATE 10 MG PO TABS
10.0000 mg | ORAL_TABLET | Freq: Every day | ORAL | Status: DC
  Administered 2015-11-07 – 2015-11-11 (×5): 10 mg via ORAL
  Filled 2015-11-07 (×5): qty 1

## 2015-11-07 MED ORDER — OXYCODONE-ACETAMINOPHEN 5-325 MG PO TABS
1.0000 | ORAL_TABLET | Freq: Every day | ORAL | Status: DC | PRN
  Administered 2015-11-07: 1 via ORAL
  Filled 2015-11-07: qty 1

## 2015-11-07 MED ORDER — POTASSIUM CHLORIDE CRYS ER 20 MEQ PO TBCR
40.0000 meq | EXTENDED_RELEASE_TABLET | Freq: Once | ORAL | Status: AC
  Administered 2015-11-07: 40 meq via ORAL
  Filled 2015-11-07: qty 2

## 2015-11-07 MED ORDER — INSULIN ASPART 100 UNIT/ML ~~LOC~~ SOLN
0.0000 [IU] | Freq: Three times a day (TID) | SUBCUTANEOUS | Status: DC
Start: 2015-11-07 — End: 2015-11-11
  Administered 2015-11-07: 3 [IU] via SUBCUTANEOUS
  Administered 2015-11-08: 1 [IU] via SUBCUTANEOUS
  Administered 2015-11-08: 5 [IU] via SUBCUTANEOUS
  Administered 2015-11-08 – 2015-11-09 (×2): 3 [IU] via SUBCUTANEOUS
  Administered 2015-11-09: 1 [IU] via SUBCUTANEOUS
  Administered 2015-11-09: 2 [IU] via SUBCUTANEOUS
  Administered 2015-11-10: 3 [IU] via SUBCUTANEOUS
  Administered 2015-11-10: 2 [IU] via SUBCUTANEOUS
  Administered 2015-11-11: 1 [IU] via SUBCUTANEOUS
  Administered 2015-11-11: 2 [IU] via SUBCUTANEOUS
  Filled 2015-11-07: qty 1
  Filled 2015-11-07: qty 3
  Filled 2015-11-07: qty 2
  Filled 2015-11-07: qty 5
  Filled 2015-11-07: qty 2
  Filled 2015-11-07: qty 3
  Filled 2015-11-07: qty 1
  Filled 2015-11-07: qty 2
  Filled 2015-11-07: qty 1
  Filled 2015-11-07 (×2): qty 3

## 2015-11-07 MED ORDER — KETOROLAC TROMETHAMINE 15 MG/ML IJ SOLN
15.0000 mg | Freq: Four times a day (QID) | INTRAMUSCULAR | Status: DC | PRN
  Administered 2015-11-07 – 2015-11-11 (×5): 15 mg via INTRAVENOUS
  Filled 2015-11-07 (×5): qty 1

## 2015-11-07 MED ORDER — MORPHINE SULFATE (PF) 2 MG/ML IV SOLN
4.0000 mg | Freq: Once | INTRAVENOUS | Status: DC
  Filled 2015-11-07: qty 2

## 2015-11-07 MED ORDER — INSULIN GLARGINE 100 UNIT/ML ~~LOC~~ SOLN
10.0000 [IU] | Freq: Every day | SUBCUTANEOUS | Status: DC
  Administered 2015-11-07 – 2015-11-10 (×4): 10 [IU] via SUBCUTANEOUS
  Filled 2015-11-07 (×5): qty 0.1

## 2015-11-07 MED ORDER — DOCUSATE SODIUM 100 MG PO CAPS: 100.0000 mg | ORAL_CAPSULE | Freq: Two times a day (BID) | ORAL | Status: DC | PRN

## 2015-11-07 MED ORDER — HYDROCORTISONE 10 MG PO TABS
20.0000 mg | ORAL_TABLET | Freq: Every morning | ORAL | Status: DC
  Administered 2015-11-08 – 2015-11-11 (×4): 20 mg via ORAL
  Filled 2015-11-07 (×6): qty 2

## 2015-11-07 MED ORDER — PANTOPRAZOLE SODIUM 40 MG PO TBEC
40.0000 mg | DELAYED_RELEASE_TABLET | Freq: Every day | ORAL | Status: DC
  Administered 2015-11-07 – 2015-11-11 (×5): 40 mg via ORAL
  Filled 2015-11-07 (×5): qty 1

## 2015-11-07 MED ORDER — MORPHINE SULFATE (PF) 2 MG/ML IV SOLN
INTRAVENOUS | Status: AC
  Administered 2015-11-07: 4 mg via INTRAVENOUS
  Filled 2015-11-07: qty 2

## 2015-11-07 MED ORDER — IOPAMIDOL (ISOVUE-370) INJECTION 76%
75.0000 mL | Freq: Once | INTRAVENOUS | Status: AC | PRN
  Administered 2015-11-07: 75 mL via INTRAVENOUS

## 2015-11-07 MED ORDER — INSULIN ASPART 100 UNIT/ML ~~LOC~~ SOLN: 0.0000 [IU] | Freq: Every day | SUBCUTANEOUS | Status: DC

## 2015-11-07 NOTE — ED Provider Notes (Signed)
[facility] Emergency Department Provider Note        Time seen: ----------------------------------------- 8:42 AM on 11/07/2015 -----------------------------------------    I have reviewed the triage vital signs and the nursing notes.   HISTORY  Chief Complaint Chest Pain    HPI Jordan Brewer is a 75 y.o. female who presents to the ER after falling in the shower this morning. She does report loss of consciousness for an unknown period of time. Patient reports severe chest pain is worse with palpation. She also states pain is worse whenever she moves. She does note that she sustained a laceration to the back of her scalp. She also has neck pain and chronic back pain. She denies any recent illness. She was recently admitted the hospital after a fall with rectal bleeding. Currently she is not having rectal bleeding.   Past Medical History:  Diagnosis Date  . Addison's disease ([place])   . Ankle fracture, left 02/2012  . Arthritis    "all over my body" (04/10/2012)  . Bipolar depression ([place])   . Cancer ([place])   . Chronic back pain    "since my hip OR in 09/2010"  . Depression   . DVT, lower extremity ([place]) 09/2010   "left; after hip replacement" (04/10/2012)  . Fall at home 02/2012   'foot got caught on heating pad cord; broke left ankle" (04/10/2012)  . GERD (gastroesophageal reflux disease)   . History of blood transfusion   . Hypercholesteremia   . Hypertension   . Syncope and collapse 04/10/2012   "w/loss of consciousness" (04/10/2012)  . Type II diabetes mellitus [facility])     Patient Active Problem List   Diagnosis Date Noted  . Colitis 10/30/2015  . Sepsis ([place]) 10/30/2015  . Type II or unspecified type diabetes mellitus without mention of complication, not stated as uncontrolled 04/10/2012  . Unspecified essential hypertension 04/10/2012  . Bipolar I disorder, most recent episode (or current) unspecified 04/10/2012  . Addison's disease ([place])  04/10/2012  . Chronic kidney disease 04/10/2012  . SAH (subarachnoid hemorrhage) ([place]) 04/10/2012    Past Surgical History:  Procedure Laterality Date  . APPENDECTOMY  ~ 1949  . AUGMENTATION MAMMAPLASTY Bilateral 1970  . CATARACT EXTRACTION W/ INTRAOCULAR LENS  IMPLANT, BILATERAL Bilateral ~ 2010  . COLOSTOMY  ~ 2010  . COLOSTOMY REVERSAL  ~ 2010   "wore a bag for ~ 6 months" (04/10/2012)  . DILATION AND CURETTAGE OF UTERUS  ~ 1964   "after a miscarriage" (04/10/2012)  . JOINT REPLACEMENT    . NASAL HEMORRHAGE CONTROL  ~ 2009   "had to have blood transfusion" (04/10/2012)  . TONSILLECTOMY  ~ 1949  . TOTAL HIP ARTHROPLASTY Left 09/2010  . VAGINAL HYSTERECTOMY  1970's?    Allergies Codeine; Prednisone; Promethazine; and Lidoderm [lidocaine]  Social History Social History  Substance Use Topics  . Smoking status: Never Smoker  . Smokeless tobacco: Never Used  . Alcohol use No    Review of Systems Constitutional: Negative for fever. Cardiovascular: Positive for chest pain Respiratory: Negative for shortness of breath. Gastrointestinal: Negative for abdominal pain, vomiting and diarrhea. Genitourinary: Negative for dysuria. Musculoskeletal: Positive for neck pain, back pain Skin: Negative for rash. Neurological: Positive for headache  10-point ROS otherwise negative.  ____________________________________________   PHYSICAL EXAM:  VITAL SIGNS: ED Triage Vitals  Enc Vitals Group     BP      Pulse      Resp  Temp      Temp src      SpO2      Weight      Height      Head Circumference      Peak Flow      Pain Score      Pain Loc      Pain Edu?      Excl. in [place]?     Constitutional: Alert and oriented. Mild distress Eyes: Conjunctivae are normal. PERRL. Normal extraocular movements. ENT   Head: Normocephalic, Posterior scalp bleeding   Nose: No congestion/rhinnorhea.   Mouth/Throat: Mucous membranes are moist.   Neck: No  stridor. Cardiovascular: Normal rate, regular rhythm. No murmurs, rubs, or gallops. Respiratory: Normal respiratory effort without tachypnea nor retractions. Breath sounds are clear and equal bilaterally. No wheezes/rales/rhonchi. Gastrointestinal: Soft and nontender. Normal bowel sounds Musculoskeletal: Nontender with normal range of motion in all extremities. No lower extremity tenderness nor edema. Severe sternal tenderness. Neurologic:  Normal speech and language. No gross focal neurologic deficits are appreciated.  Skin:  Skin is warm, dry and intact. No rash noted. Psychiatric: Mood and affect are normal. Speech and behavior are normal.  ____________________________________________  EKG: Interpreted by me. Sinus tachycardia with a rate of 117 bpm, normal PR interval, normal QRS size, normal QT. Left axis deviation. Likely inferior infarct age indeterminate  ____________________________________________  ED COURSE:  Pertinent labs & imaging results that were available during my care of the patient were reviewed by me and considered in my medical decision making (see chart for details). Clinical Course  Patient presents after a fall in the shower. We'll assess with labs and imaging.  Procedures ____________________________________________   LABS (pertinent positives/negatives)  Labs Reviewed  CBC WITH DIFFERENTIAL/PLATELET - Abnormal; Notable for the following:       Result Value   MCV 78.4 (*)    MCH 25.5 (*)    RDW 18.2 (*)    Platelets 452 (*)    Neutro Abs 7.3 (*)    All other components within normal limits  COMPREHENSIVE METABOLIC PANEL - Abnormal; Notable for the following:    Potassium 2.9 (*)    Glucose, Bld 179 (*)    ALT 13 (*)    All other components within normal limits  TROPONIN I  URINALYSIS COMPLETEWITH MICROSCOPIC ([facility] ONLY)    RADIOLOGY Images were viewed by me  CT head, C-spine, chest x-ray IMPRESSION: Stable brain atrophy and extensive white  matter microvascular ischemic changes. No acute intracranial abnormality.  Diffuse moderate degenerative spondylosis of the cervical spine with C3-C7 with mild kyphotic curvature and multilevel facet arthropathy. No definite acute osseous finding or fracture.  IMPRESSION: Study is negative for pulmonary emboli.  Nondisplaced right anterior sixth rib fracture.  Questionable nondisplaced sternal fracture of the anterior cortex, best seen on sagittal reformatted images. No comparison available. Recommend correlation with a history of trauma of the anterior chest as well as point tenderness at this location.  Aortic atherosclerosis. Associated coronary artery disease.  Architectural distortions/chronic scarring of the left upper lobe.   ____________________________________________  FINAL ASSESSMENT AND PLAN  Syncope, chest pain, minor head injury, rib fracture, sternal fracture  Plan: Patient with labs and imaging as dictated above. Patient presents to the ER with severe pain from a fall. Patient states she passed out. She was just admitted for syncope which was thought to be due to GI bleed. Currently she is not having GI bleeding so the etiology of syncope is unclear. She  does have intractable pain, has received several doses of IV narcotics and Toradol for pain without improvement. I will discuss with the hospitalist for observation.   Earleen Newport, MD   Note: This dictation was prepared with Dragon dictation. Any transcriptional errors that result from this process are unintentional    Earleen Newport, MD 11/07/15 1136

## 2015-11-07 NOTE — ED Notes (Signed)
Pt is aware that a urine sample is needed - she verbalized that she would let this nurse know when she has to void

## 2015-11-07 NOTE — Progress Notes (Signed)
Patient stated that she fell on Wednesday and she woke up in hospital. She was enthusiastic about prayer as she asked chaplain to pray for her. She said that she lives alone, but she has a cat called Bambobee who is the source of her joy. She said that she can't wait to get well and go home to see Bambobee. Chaplain prayed with patient before he left the room.

## 2015-11-07 NOTE — ED Triage Notes (Signed)
Pt reports falling into shower this AM, reports +LOC with unknown time. Pt reports pain to chest with arm movement or body movement and pain to back of head; dried blood present at this time.

## 2015-11-07 NOTE — Plan of Care (Signed)
Problem: Safety: Goal: Ability to remain free from injury will improve Outcome: Progressing Bed alarm on. Patient educated on safety. Agrees to call before trying to get out of bed.

## 2015-11-07 NOTE — Progress Notes (Addendum)
Patient admitted to unit. Oriented to room, call bell, and staff. Bed in lowest position. Fall safety plan reviewed. Full assessment to Epic. Skin assessment verified with Earlyne Iba. RN. Telemetry box verification with tele clerk- Box#: [id number]. Will continue to monitor. Order for orthostatics vitals - patient refusing to sit on edge of bed or stand at this time. Will try later.  Also spoke to Dr. Bridgett Larsson regarding pain medication regimen and percocet only showing as PRN daily. - Per MD this is how he wants her pain to be managed due to her allergies. Patient agreeable at this time.

## 2015-11-07 NOTE — ED Notes (Signed)
Pt with intermittent desat in room, MD notified. CTA chest ordered.

## 2015-11-07 NOTE — Care Management Obs Status (Signed)
[place] NOTIFICATION   Patient Details  Name: Shaunee Perotti MRN: [id number] Date of Birth: 09/21/1940   Medicare Observation Status Notification Given:  Yes    Beau Fanny, RN 11/07/2015, 12:14 PM

## 2015-11-07 NOTE — ED Notes (Signed)
20 gauge Iv saline lock placed in left side of chest / shoulder

## 2015-11-07 NOTE — H&P (Addendum)
[facility] [place] NAME: Jordan Brewer    MR#:  [id number]  DATE OF BIRTH:  September 23, 1940  DATE OF ADMISSION:  11/07/2015  PRIMARY CARE PHYSICIAN: Tracie Harrier, MD   REQUESTING/REFERRING PHYSICIAN: Earleen Newport, MD  CHIEF COMPLAINT:   Chief Complaint  Patient presents with  . Chest Pain   Syncope today. HISTORY OF PRESENT ILLNESS:  Jordan Brewer  is a 75 y.o. female with a known history of Hypertension, diabetes, hyperlipidemia and chronic back pain. The patient is alert, awake and oriented now. She said she was taking shower this morning, she passed out for unknown period of time. After she woke up, she had severe chest pain which is worse with palpation. She also noticed that she has a laceration on the back of her scalp. She has generalized weakness. The patient had GI bleeding recently. CT angiogram of chest didn't show any PE, Nondisplaced right anterior sixth rib fracture. Questionable nondisplaced sternal fracture of the anterior cortex,  PAST MEDICAL HISTORY:   Past Medical History:  Diagnosis Date  . Addison's disease ([place])   . Ankle fracture, left 02/2012  . Arthritis    "all over my body" (04/10/2012)  . Bipolar depression ([place])   . Cancer ([place])   . Chronic back pain    "since my hip OR in 09/2010"  . Depression   . DVT, lower extremity ([place]) 09/2010   "left; after hip replacement" (04/10/2012)  . Fall at home 02/2012   'foot got caught on heating pad cord; broke left ankle" (04/10/2012)  . GERD (gastroesophageal reflux disease)   . History of blood transfusion   . Hypercholesteremia   . Hypertension   . Syncope and collapse 04/10/2012   "w/loss of consciousness" (04/10/2012)  . Type II diabetes mellitus ([place])     PAST SURGICAL HISTORY:   Past Surgical History:  Procedure Laterality Date  . APPENDECTOMY  ~ 1949  . AUGMENTATION MAMMAPLASTY Bilateral 1970  . CATARACT EXTRACTION W/ INTRAOCULAR LENS  IMPLANT,  BILATERAL Bilateral ~ 2010  . COLOSTOMY  ~ 2010  . COLOSTOMY REVERSAL  ~ 2010   "wore a bag for ~ 6 months" (04/10/2012)  . DILATION AND CURETTAGE OF UTERUS  ~ 1964   "after a miscarriage" (04/10/2012)  . JOINT REPLACEMENT    . NASAL HEMORRHAGE CONTROL  ~ 2009   "had to have blood transfusion" (04/10/2012)  . TONSILLECTOMY  ~ 1949  . TOTAL HIP ARTHROPLASTY Left 09/2010  . VAGINAL HYSTERECTOMY  1970's?    SOCIAL HISTORY:   Social History  Substance Use Topics  . Smoking status: Never Smoker  . Smokeless tobacco: Never Used  . Alcohol use No    FAMILY HISTORY:   Family History  Problem Relation Age of Onset  . Hypertension Mother   . Diabetes Mellitus II Mother   . Heart attack Mother   . Lung cancer Father     DRUG ALLERGIES:   Allergies  Allergen Reactions  . Codeine Nausea And Vomiting  . 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 Promethazine Nausea And Vomiting  . Lidoderm [Lidocaine] Rash    REVIEW OF SYSTEMS:   Review of Systems  Constitutional: Positive for malaise/fatigue. Negative for chills and fever.  Eyes: Negative for blurred vision and double vision.  Respiratory: Negative for cough, shortness of breath and stridor.   Cardiovascular: Positive for chest pain. Negative for leg swelling.  Gastrointestinal:  Negative for abdominal pain, diarrhea, nausea and vomiting.  Genitourinary: Negative for dysuria and hematuria.  Musculoskeletal:       Substernal and rib pain  Skin: Negative for itching and rash.  Neurological: Positive for loss of consciousness and weakness. Negative for dizziness, focal weakness and headaches.  Psychiatric/Behavioral: Negative for depression. The patient is not nervous/anxious.     MEDICATIONS AT HOME:   Prior to Admission medications   Medication Sig Start Date End Date Taking? Authorizing Provider  alum & mag hydroxide-simeth (MAALOX/MYLANTA) 200-200-20 MG/5ML suspension Take 30 mLs by mouth  every 6 (six) hours as needed for indigestion or heartburn. 11/02/15  Yes Vaughan Basta, MD  amLODipine (NORVASC) 10 MG tablet Take 10 mg by mouth daily.   Yes Historical Provider, MD  docusate sodium (COLACE) 100 MG capsule Take 1 capsule (100 mg total) by mouth 2 (two) times daily. Patient taking differently: Take 100 mg by mouth 2 (two) times daily as needed for moderate constipation.  11/16/14  Yes Gladstone Lighter, MD  DULoxetine (CYMBALTA) 60 MG capsule Take 60 mg by mouth daily.   Yes Historical Provider, MD  fludrocortisone (FLORINEF) 0.1 MG tablet Take 0.1 mg by mouth daily.   Yes Historical Provider, MD  hydrocortisone (CORTEF) 10 MG tablet Take 10-20 mg by mouth 2 (two) times daily. 20 mg every morning and 10 mg at bedtime   Yes Historical Provider, MD  insulin aspart (NOVOLOG) 100 UNIT/ML injection Inject into the skin 3 (three) times daily before meals. Per sliding scale 8 units am,10 units noon, 12 units pm   Yes Historical Provider, MD  insulin glargine (LANTUS) 100 unit/mL SOPN Inject 10 Units into the skin at bedtime.   Yes Historical Provider, MD  lisinopril (PRINIVIL,ZESTRIL) 20 MG tablet Take 20 mg by mouth 2 (two) times daily.   Yes Historical Provider, MD  lovastatin (MEVACOR) 40 MG tablet Take 40 mg by mouth at bedtime. 08/20/14  Yes Historical Provider, MD  omeprazole (PRILOSEC) 40 MG capsule Take 40 mg by mouth 2 (two) times daily.   Yes Historical Provider, MD  oxyCODONE-acetaminophen (PERCOCET/ROXICET) 5-325 MG tablet Take 1 tablet by mouth daily as needed for severe pain.    Yes Historical Provider, MD  Polyethyl Glycol-Propyl Glycol (SYSTANE OP) Apply 1 drop to eye daily as needed. For burning/irritated eyes   Yes Historical Provider, MD  TRULICITY [id number] SOPN Inject 0.5 mLs into the skin every Wednesday.   Yes Historical Provider, MD      VITAL SIGNS:  Blood pressure (!) 126/59, pulse (!) 102, temperature 98.2 F (36.8 C), temperature source Oral, resp.  rate 17, height 5\' 4"  (1.626 m), weight 170 lb 1.6 oz (77.2 kg), SpO2 96 %.  PHYSICAL EXAMINATION:  Physical Exam  Constitutional: She is oriented to person, place, and time and well-developed, well-nourished, and in no distress.  HENT:  Head: Normocephalic.  Mouth/Throat: Oropharynx is clear and moist.  Eyes: Conjunctivae and EOM are normal. Pupils are equal, round, and reactive to light.  Neck: Normal range of motion. Neck supple. No JVD present. No tracheal deviation present.  Cardiovascular: Normal rate, regular rhythm and normal heart sounds.  Exam reveals no gallop.   No murmur heard. Pulmonary/Chest: Effort normal and breath sounds normal. No respiratory distress. She has no wheezes. She has no rales. She exhibits tenderness.  Tenderness in substernal area with palpation. Chest tenderness while moving body.  Abdominal: Soft. Bowel sounds are normal. She exhibits no distension. There is no tenderness.  Musculoskeletal: Normal range of motion. She exhibits no edema or tenderness.  Neurological: She is alert and oriented to person, place, and time. No cranial nerve deficit.  Skin: No rash noted. No erythema.  Psychiatric: Affect normal.   LABORATORY PANEL:   CBC  Recent Labs Lab 11/07/15 0842  WBC 10.1  HGB 12.5  HCT 38.3  PLT 452*   ------------------------------------------------------------------------------------------------------------------  Chemistries   Recent Labs Lab 11/07/15 0842  NA 137  K 2.9*  CL 102  CO2 24  GLUCOSE 179*  BUN 8  CREATININE 0.91  CALCIUM 8.9  AST 25  ALT 13*  ALKPHOS 71  BILITOT 0.4   ------------------------------------------------------------------------------------------------------------------  Cardiac Enzymes  Recent Labs Lab 11/07/15 0842  TROPONINI <0.03   ------------------------------------------------------------------------------------------------------------------  RADIOLOGY:  Dg Chest 2 View  Result  Date: 11/07/2015 CLINICAL DATA:  Chest pain, status post fall EXAM: CHEST  2 VIEW COMPARISON:  07/13/2013 FINDINGS: Lungs are essentially clear. No focal consolidation. No pleural effusion or pneumothorax. The heart is top-normal in size. Mild degenerative changes of the visualized thoracolumbar spine. Bilateral breast augmentation. IMPRESSION: No evidence of acute cardiopulmonary disease. Electronically Signed   By: Julian Hy M.D.   On: 11/07/2015 09:15   Ct Head Wo Contrast  Result Date: 11/07/2015 CLINICAL DATA:  Fall in shower this morning with loss of consciousness. Headache and neck pain. EXAM: CT HEAD WITHOUT CONTRAST CT CERVICAL SPINE WITHOUT CONTRAST TECHNIQUE: Multidetector CT imaging of the head and cervical spine was performed following the standard protocol without intravenous contrast. Multiplanar CT image reconstructions of the cervical spine were also generated. COMPARISON:  05/19/2012 FINDINGS: CT HEAD FINDINGS Brain: Stable brain atrophy pattern with extensive diffuse white matter microvascular ischemic changes throughout both cerebral hemispheres. No acute intracranial hemorrhage, mass lesion, definite new infarction, midline shift, herniation, hydrocephalus, or extra-axial fluid collection. Cisterns are patent. No cerebellar abnormality. Vascular: No hyperdense vessel or unexpected calcification. Skull: Normal. Negative for fracture or focal lesion. Sinuses/Orbits: No acute finding. Other: None. CT CERVICAL SPINE FINDINGS Alignment: Diffuse mild kyphotic curvature of the cervical spine may be related to positioning and diffuse multilevel facet arthropathy. Intact odontoid. Multilevel diffuse spondylosis from C3 - C7 with disc space narrowing, sclerosis and endplate osteophytes. Minor grade 1 anterolisthesis of C3 upon C4 appearing secondary to facet arthropathy. No acute osseous finding or definite fracture. Preserved vertebral body heights. No subluxation or dislocation. Scarring  in the lung apices noted. Azygos fissure in the right upper lobe. No soft tissue swelling or hematoma appreciated. Normal prevertebral soft tissues. Skull base and vertebrae: No acute fracture. No primary bone lesion or focal pathologic process. Soft tissues and spinal canal: No prevertebral fluid or swelling. No visible canal hematoma. Disc levels:  As above Upper chest: As above Other: None. IMPRESSION: Stable brain atrophy and extensive white matter microvascular ischemic changes. No acute intracranial abnormality. Diffuse moderate degenerative spondylosis of the cervical spine with C3-C7 with mild kyphotic curvature and multilevel facet arthropathy. No definite acute osseous finding or fracture. Electronically Signed   By: Jerilynn Mages.  Shick M.D.   On: 11/07/2015 09:42   Ct Angio Chest Pe W And/or Wo Contrast  Result Date: 11/07/2015 CLINICAL DATA:  75 year old female with a history of syncope. Chest pain. EXAM: CT ANGIOGRAPHY CHEST WITH CONTRAST TECHNIQUE: Multidetector CT imaging of the chest was performed using the standard protocol during bolus administration of intravenous contrast. Multiplanar CT image reconstructions and MIPs were obtained to evaluate the vascular anatomy. CONTRAST:  75 cc Isovue 3 7  COMPARISON:  CT chest 07/27/2010, CT abdomen 10/30/2015 FINDINGS: Cardiovascular: Heart: No cardiomegaly. No pericardial fluid/thickening. Calcifications of the left main, left anterior descending, circumflex coronary arteries. Aorta: Tortuous aortic arch and branch vessels. No aneurysm. No dissection. No periaortic fluid. Calcifications of the descending thoracic aorta. The left common carotid artery tortuosity results in compression of the left brachiocephalic vein. There is resulting multiple chest wall collaterals draining the in coming contrast bolus. Pulmonary arteries: No central, lobar, segmental, or proximal subsegmental filling defects. Mediastinum/Nodes: Mediastinal lymph nodes are present, none of  which are enlarged by CT size criteria. Unremarkable appearance of the thoracic esophagus. Unremarkable appearance of the thoracic inlet and thyroid. Lungs/Pleura: Azygos lobe. Architectural distortion of the left upper lobe anteriorly. No confluent airspace disease. No pleural effusion or pneumothorax. Upper Abdomen: Similar appearance of pancreatic ductal enlargement, unchanged from the CT of October 2017 and incompletely imaged. Chest wall: Surgical changes of bilateral breast reconstruction/augmentation. Musculoskeletal: Multilevel degenerative changes of the spine. Nondisplaced right sixth rib fracture anteriorly. There is a questionable nondisplaced fracture of the sternum on the sagittal reformatted images with anterior angulation of the cortex. Review of the MIP images confirms the above findings. IMPRESSION: Study is negative for pulmonary emboli. Nondisplaced right anterior sixth rib fracture. Questionable nondisplaced sternal fracture of the anterior cortex, best seen on sagittal reformatted images. No comparison available. Recommend correlation with a history of trauma of the anterior chest as well as point tenderness at this location. Aortic atherosclerosis.  Associated coronary artery disease. Architectural distortions/chronic scarring of the left upper lobe. Signed, Dulcy Fanny. Earleen Newport, DO [facility] Electronically Signed   By: Corrie Mckusick D.O.   On: 11/07/2015 10:59   Ct Cervical Spine Wo Contrast  Result Date: 11/07/2015 CLINICAL DATA:  Fall in shower this morning with loss of consciousness. Headache and neck pain. EXAM: CT HEAD WITHOUT CONTRAST CT CERVICAL SPINE WITHOUT CONTRAST TECHNIQUE: Multidetector CT imaging of the head and cervical spine was performed following the standard protocol without intravenous contrast. Multiplanar CT image reconstructions of the cervical spine were also generated. COMPARISON:  05/19/2012 FINDINGS: CT  HEAD FINDINGS Brain: Stable brain atrophy pattern with extensive diffuse white matter microvascular ischemic changes throughout both cerebral hemispheres. No acute intracranial hemorrhage, mass lesion, definite new infarction, midline shift, herniation, hydrocephalus, or extra-axial fluid collection. Cisterns are patent. No cerebellar abnormality. Vascular: No hyperdense vessel or unexpected calcification. Skull: Normal. Negative for fracture or focal lesion. Sinuses/Orbits: No acute finding. Other: None. CT CERVICAL SPINE FINDINGS Alignment: Diffuse mild kyphotic curvature of the cervical spine may be related to positioning and diffuse multilevel facet arthropathy. Intact odontoid. Multilevel diffuse spondylosis from C3 - C7 with disc space narrowing, sclerosis and endplate osteophytes. Minor grade 1 anterolisthesis of C3 upon C4 appearing secondary to facet arthropathy. No acute osseous finding or definite fracture. Preserved vertebral body heights. No subluxation or dislocation. Scarring in the lung apices noted. Azygos fissure in the right upper lobe. No soft tissue swelling or hematoma appreciated. Normal prevertebral soft tissues. Skull base and vertebrae: No acute fracture. No primary bone lesion or focal pathologic process. Soft tissues and spinal canal: No prevertebral fluid or swelling. No visible canal hematoma. Disc levels:  As above Upper chest: As above Other: None. IMPRESSION: Stable brain atrophy and extensive white matter microvascular ischemic changes. No acute intracranial abnormality. Diffuse moderate degenerative spondylosis of the cervical spine with C3-C7 with mild kyphotic curvature and multilevel facet arthropathy. No definite acute osseous finding or  fracture. Electronically Signed   By: Jerilynn Mages.  Shick M.D.   On: 11/07/2015 09:42      IMPRESSION AND PLAN:   Syncope, unclear etiology The patient will be placed for observation. Continue telemetry monitor, get carotid duplex and  echocardiogram.  Hypokalemia. Give potassium supplement and follow-up BMP and magnesium.  Rib fracture and substernal fracture.  Pain control.  Hypertension. Continue hypertension medication. Diabetes. Start sliding scale.  All the records are reviewed and case discussed with ED provider. Management plans discussed with the patient, family and they are in agreement. I discussed with the patient about CODE STATUS, she wants DO NOT RESUSCITATE.  CODE STATUS: DO NOT RESUSCITATE  TOTAL TIME TAKING CARE OF THIS PATIENT: 52 minutes.    Demetrios Loll M.D on 11/07/2015 at 1:43 PM  Between 7am to 6pm - Pager - [phone]  After 6pm go to [email or web address] - Proofreader  [facility]  Office  [phone]  CC: Primary care physician; Tracie Harrier, MD   Note: This dictation was prepared with Dragon dictation along with smaller phrase technology. Any transcriptional errors that result from this process are unintentional.

## 2015-11-08 DIAGNOSIS — I10 Essential (primary) hypertension: Secondary | ICD-10-CM | POA: Diagnosis not present

## 2015-11-08 DIAGNOSIS — S2239XA Fracture of one rib, unspecified side, initial encounter for closed fracture: Secondary | ICD-10-CM | POA: Diagnosis not present

## 2015-11-08 DIAGNOSIS — R55 Syncope and collapse: Secondary | ICD-10-CM | POA: Diagnosis not present

## 2015-11-08 DIAGNOSIS — E876 Hypokalemia: Secondary | ICD-10-CM | POA: Diagnosis not present

## 2015-11-08 LAB — MAGNESIUM: Magnesium: 2.2 mg/dL (ref 1.7–2.4)

## 2015-11-08 LAB — BASIC METABOLIC PANEL
Anion gap: 5 (ref 5–15)
BUN: 6 mg/dL (ref 6–20)
CHLORIDE: 108 mmol/L (ref 101–111)
CO2: 25 mmol/L (ref 22–32)
CREATININE: 0.81 mg/dL (ref 0.44–1.00)
Calcium: 7.7 mg/dL — ABNORMAL LOW (ref 8.9–10.3)
GFR calc Af Amer: >60 mL/min (ref >60)
GFR calc non Af Amer: >60 mL/min (ref >60)
GLUCOSE: 171 mg/dL — AB (ref 65–99)
Potassium: 3.5 mmol/L (ref 3.5–5.1)
Sodium: 138 mmol/L (ref 135–145)

## 2015-11-08 LAB — GLUCOSE, CAPILLARY
GLUCOSE-CAPILLARY: 202 mg/dL — AB (ref 65–99)
Glucose-Capillary: 137 mg/dL — ABNORMAL HIGH (ref 65–99)
Glucose-Capillary: 270 mg/dL — ABNORMAL HIGH (ref 65–99)
Glucose-Capillary: 133 mg/dL — ABNORMAL HIGH (ref 65–99)

## 2015-11-08 LAB — CBC
HCT: 32.2 % — ABNORMAL LOW (ref 35.0–47.0)
Hemoglobin: 10.4 g/dL — ABNORMAL LOW (ref 12.0–16.0)
MCH: 25.8 pg — AB (ref 26.0–34.0)
MCHC: 32.2 g/dL (ref 32.0–36.0)
MCV: 80.1 fL (ref 80.0–100.0)
PLATELETS: 341 10*3/uL (ref 150–440)
RBC: 4.02 MIL/uL (ref 3.80–5.20)
RDW: 18 % — ABNORMAL HIGH (ref 11.5–14.5)
WBC: 7.1 10*3/uL (ref 3.6–11.0)

## 2015-11-08 LAB — ECHOCARDIOGRAM COMPLETE
HEIGHTINCHES: 64 in
WEIGHTICAEL: 2740.8 [oz_av]

## 2015-11-08 LAB — TSH: TSH: 0.797 u[IU]/mL (ref 0.350–4.500)

## 2015-11-08 MED ORDER — NYSTATIN 100000 UNIT/ML MT SUSP
5.0000 mL | Freq: Four times a day (QID) | OROMUCOSAL | Status: DC
  Administered 2015-11-08 – 2015-11-11 (×11): 500000 [IU] via ORAL
  Filled 2015-11-08 (×16): qty 5

## 2015-11-08 MED ORDER — DOCUSATE SODIUM 100 MG PO CAPS
100.0000 mg | ORAL_CAPSULE | Freq: Two times a day (BID) | ORAL | Status: DC
  Administered 2015-11-08 – 2015-11-10 (×4): 100 mg via ORAL
  Filled 2015-11-08 (×4): qty 1

## 2015-11-08 MED ORDER — OXYCODONE-ACETAMINOPHEN 5-325 MG PO TABS
1.0000 | ORAL_TABLET | ORAL | Status: DC | PRN
  Administered 2015-11-08 – 2015-11-09 (×5): 1 via ORAL
  Filled 2015-11-08 (×5): qty 1

## 2015-11-08 NOTE — Progress Notes (Deleted)
Notified MD of pt no voiding per my shift. MD ordered In&Out Cath. Will continue to monitor pt.

## 2015-11-08 NOTE — Progress Notes (Signed)
[place] NAME: Jordan Brewer    MR#:  [id number]  DATE OF BIRTH:  08-04-1940  SUBJECTIVE:  CHIEF COMPLAINT:   Chief Complaint  Patient presents with  . Chest Pain   Continues to have pleuritic chest pain from her rib and sternal fracture. No shortness of breath. Feels weak.  REVIEW OF SYSTEMS:    Review of Systems  Constitutional: Positive for malaise/fatigue. Negative for chills and fever.  HENT: Negative for sore throat.   Eyes: Negative for blurred vision, double vision and pain.  Respiratory: Negative for cough, hemoptysis, shortness of breath and wheezing.   Cardiovascular: Positive for chest pain. Negative for palpitations, orthopnea and leg swelling.  Gastrointestinal: Negative for abdominal pain, constipation, diarrhea, heartburn, nausea and vomiting.  Genitourinary: Negative for dysuria and hematuria.  Musculoskeletal: Positive for joint pain. Negative for back pain.  Skin: Negative for rash.  Neurological: Positive for weakness. Negative for sensory change, speech change, focal weakness and headaches.  Endo/Heme/Allergies: Does not bruise/bleed easily.  Psychiatric/Behavioral: Negative for depression. The patient is not nervous/anxious.     DRUG ALLERGIES:   Allergies  Allergen Reactions  . Codeine Nausea And Vomiting  . 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 Promethazine Nausea And Vomiting  . Lidoderm [Lidocaine] Rash    VITALS:  Blood pressure 117/60, pulse (!) 110, temperature 98.5 F (36.9 C), temperature source Oral, resp. rate 18, height 5\' 4"  (1.626 m), weight 77.7 kg (171 lb 4.8 oz), SpO2 94 %.  PHYSICAL EXAMINATION:   Physical Exam  GENERAL:  75 y.o.-year-old patient lying in the bed with no acute distress.  EYES: Pupils equal, round, reactive to light and accommodation. No scleral icterus. Extraocular muscles intact.  HEENT: Head atraumatic, normocephalic.  Oropharynx and nasopharynx clear.  NECK:  Supple, no jugular venous distention. No thyroid enlargement, no tenderness.  LUNGS: Basilar crackles. Tenderness middle and right side of chest. CARDIOVASCULAR: S1, S2 normal. No murmurs, rubs, or gallops.  ABDOMEN: Soft, nontender, nondistended. Bowel sounds present. No organomegaly or mass.  EXTREMITIES: No cyanosis, clubbing or edema b/l.    NEUROLOGIC: Cranial nerves II through XII are intact. No focal Motor or sensory deficits b/l.   PSYCHIATRIC: The patient is alert and oriented x 3.  SKIN: No obvious rash, lesion, or ulcer.   LABORATORY PANEL:   CBC  Recent Labs Lab 11/08/15 0329  WBC 7.1  HGB 10.4*  HCT 32.2*  PLT 341   ------------------------------------------------------------------------------------------------------------------ Chemistries   Recent Labs Lab 11/07/15 0842 11/08/15 0329  NA 137 138  K 2.9* 3.5  CL 102 108  CO2 24 25  GLUCOSE 179* 171*  BUN 8 6  CREATININE 0.91 0.81  CALCIUM 8.9 7.7*  MG 1.4* 2.2  AST 25  --   ALT 13*  --   ALKPHOS 71  --   BILITOT 0.4  --    ------------------------------------------------------------------------------------------------------------------  Cardiac Enzymes  Recent Labs Lab 11/07/15 0842  TROPONINI <0.03   ------------------------------------------------------------------------------------------------------------------  RADIOLOGY:  Dg Chest 2 View  Result Date: 11/07/2015 CLINICAL DATA:  Chest pain, status post fall EXAM: CHEST  2 VIEW COMPARISON:  07/13/2013 FINDINGS: Lungs are essentially clear. No focal consolidation. No pleural effusion or pneumothorax. The heart is top-normal in size. Mild degenerative changes of the visualized thoracolumbar spine. Bilateral breast augmentation. IMPRESSION: No evidence of acute cardiopulmonary disease. Electronically Signed   By: Julian Hy M.D.   On:  11/07/2015 09:15   Ct Head Wo Contrast  Result Date:  11/07/2015 CLINICAL DATA:  Fall in shower this morning with loss of consciousness. Headache and neck pain. EXAM: CT HEAD WITHOUT CONTRAST CT CERVICAL SPINE WITHOUT CONTRAST TECHNIQUE: Multidetector CT imaging of the head and cervical spine was performed following the standard protocol without intravenous contrast. Multiplanar CT image reconstructions of the cervical spine were also generated. COMPARISON:  05/19/2012 FINDINGS: CT HEAD FINDINGS Brain: Stable brain atrophy pattern with extensive diffuse white matter microvascular ischemic changes throughout both cerebral hemispheres. No acute intracranial hemorrhage, mass lesion, definite new infarction, midline shift, herniation, hydrocephalus, or extra-axial fluid collection. Cisterns are patent. No cerebellar abnormality. Vascular: No hyperdense vessel or unexpected calcification. Skull: Normal. Negative for fracture or focal lesion. Sinuses/Orbits: No acute finding. Other: None. CT CERVICAL SPINE FINDINGS Alignment: Diffuse mild kyphotic curvature of the cervical spine may be related to positioning and diffuse multilevel facet arthropathy. Intact odontoid. Multilevel diffuse spondylosis from C3 - C7 with disc space narrowing, sclerosis and endplate osteophytes. Minor grade 1 anterolisthesis of C3 upon C4 appearing secondary to facet arthropathy. No acute osseous finding or definite fracture. Preserved vertebral body heights. No subluxation or dislocation. Scarring in the lung apices noted. Azygos fissure in the right upper lobe. No soft tissue swelling or hematoma appreciated. Normal prevertebral soft tissues. Skull base and vertebrae: No acute fracture. No primary bone lesion or focal pathologic process. Soft tissues and spinal canal: No prevertebral fluid or swelling. No visible canal hematoma. Disc levels:  As above Upper chest: As above Other: None. IMPRESSION: Stable brain atrophy and extensive white matter microvascular ischemic changes. No acute  intracranial abnormality. Diffuse moderate degenerative spondylosis of the cervical spine with C3-C7 with mild kyphotic curvature and multilevel facet arthropathy. No definite acute osseous finding or fracture. Electronically Signed   By: Jerilynn Mages.  Shick M.D.   On: 11/07/2015 09:42   Ct Angio Chest Pe W And/or Wo Contrast  Result Date: 11/07/2015 CLINICAL DATA:  75 year old female with a history of syncope. Chest pain. EXAM: CT ANGIOGRAPHY CHEST WITH CONTRAST TECHNIQUE: Multidetector CT imaging of the chest was performed using the standard protocol during bolus administration of intravenous contrast. Multiplanar CT image reconstructions and MIPs were obtained to evaluate the vascular anatomy. CONTRAST:  75 cc Isovue 3 7 COMPARISON:  CT chest 07/27/2010, CT abdomen 10/30/2015 FINDINGS: Cardiovascular: Heart: No cardiomegaly. No pericardial fluid/thickening. Calcifications of the left main, left anterior descending, circumflex coronary arteries. Aorta: Tortuous aortic arch and branch vessels. No aneurysm. No dissection. No periaortic fluid. Calcifications of the descending thoracic aorta. The left common carotid artery tortuosity results in compression of the left brachiocephalic vein. There is resulting multiple chest wall collaterals draining the in coming contrast bolus. Pulmonary arteries: No central, lobar, segmental, or proximal subsegmental filling defects. Mediastinum/Nodes: Mediastinal lymph nodes are present, none of which are enlarged by CT size criteria. Unremarkable appearance of the thoracic esophagus. Unremarkable appearance of the thoracic inlet and thyroid. Lungs/Pleura: Azygos lobe. Architectural distortion of the left upper lobe anteriorly. No confluent airspace disease. No pleural effusion or pneumothorax. Upper Abdomen: Similar appearance of pancreatic ductal enlargement, unchanged from the CT of October 2017 and incompletely imaged. Chest wall: Surgical changes of bilateral breast  reconstruction/augmentation. Musculoskeletal: Multilevel degenerative changes of the spine. Nondisplaced right sixth rib fracture anteriorly. There is a questionable nondisplaced fracture of the sternum on the sagittal reformatted images with anterior angulation of the cortex. Review of the MIP images confirms the above findings. IMPRESSION: Study  is negative for pulmonary emboli. Nondisplaced right anterior sixth rib fracture. Questionable nondisplaced sternal fracture of the anterior cortex, best seen on sagittal reformatted images. No comparison available. Recommend correlation with a history of trauma of the anterior chest as well as point tenderness at this location. Aortic atherosclerosis.  Associated coronary artery disease. Architectural distortions/chronic scarring of the left upper lobe. Signed, Dulcy Fanny. Earleen Newport, DO [facility] Electronically Signed   By: Corrie Mckusick D.O.   On: 11/07/2015 10:59   Ct Cervical Spine Wo Contrast  Result Date: 11/07/2015 CLINICAL DATA:  Fall in shower this morning with loss of consciousness. Headache and neck pain. EXAM: CT HEAD WITHOUT CONTRAST CT CERVICAL SPINE WITHOUT CONTRAST TECHNIQUE: Multidetector CT imaging of the head and cervical spine was performed following the standard protocol without intravenous contrast. Multiplanar CT image reconstructions of the cervical spine were also generated. COMPARISON:  05/19/2012 FINDINGS: CT HEAD FINDINGS Brain: Stable brain atrophy pattern with extensive diffuse white matter microvascular ischemic changes throughout both cerebral hemispheres. No acute intracranial hemorrhage, mass lesion, definite new infarction, midline shift, herniation, hydrocephalus, or extra-axial fluid collection. Cisterns are patent. No cerebellar abnormality. Vascular: No hyperdense vessel or unexpected calcification. Skull: Normal. Negative for fracture or focal lesion. Sinuses/Orbits: No  acute finding. Other: None. CT CERVICAL SPINE FINDINGS Alignment: Diffuse mild kyphotic curvature of the cervical spine may be related to positioning and diffuse multilevel facet arthropathy. Intact odontoid. Multilevel diffuse spondylosis from C3 - C7 with disc space narrowing, sclerosis and endplate osteophytes. Minor grade 1 anterolisthesis of C3 upon C4 appearing secondary to facet arthropathy. No acute osseous finding or definite fracture. Preserved vertebral body heights. No subluxation or dislocation. Scarring in the lung apices noted. Azygos fissure in the right upper lobe. No soft tissue swelling or hematoma appreciated. Normal prevertebral soft tissues. Skull base and vertebrae: No acute fracture. No primary bone lesion or focal pathologic process. Soft tissues and spinal canal: No prevertebral fluid or swelling. No visible canal hematoma. Disc levels:  As above Upper chest: As above Other: None. IMPRESSION: Stable brain atrophy and extensive white matter microvascular ischemic changes. No acute intracranial abnormality. Diffuse moderate degenerative spondylosis of the cervical spine with C3-C7 with mild kyphotic curvature and multilevel facet arthropathy. No definite acute osseous finding or fracture. Electronically Signed   By: Jerilynn Mages.  Shick M.D.   On: 11/07/2015 09:42   US Carotid Bilateral  Result Date: 11/07/2015 CLINICAL DATA:  Syncope EXAM: BILATERAL CAROTID DUPLEX ULTRASOUND TECHNIQUE: Pearline Cables scale imaging, color Doppler and duplex ultrasound were performed of bilateral carotid and vertebral arteries in the neck. COMPARISON:  None. FINDINGS: Criteria: Quantification of carotid stenosis is based on velocity parameters that correlate the residual internal carotid diameter with NASCET-based stenosis levels, using the diameter of the distal internal carotid lumen as the denominator for stenosis measurement. The following peak systolic velocity measurements were obtained: RIGHT ICA:  87 cm/sec CCA:  68  cm/sec SYSTOLIC ICA/CCA RATIO:  1.3 DIASTOLIC ICA/CCA RATIO:  1.1 ECA:  123 cm/sec LEFT ICA:  101 cm/sec CCA:  92 cm/sec SYSTOLIC ICA/CCA RATIO:  1.1 DIASTOLIC ICA/CCA RATIO:  1.3 ECA:  1.2 cm/sec RIGHT CAROTID ARTERY: There is mild calcification in the bifurcation on the right causing approximately 20% diameter stenosis. There is mild mucosal thickening at the origin of the internal carotid artery on the right without appreciable obstruction. Most stenosis approaching 50% diameter is appreciable on the right. RIGHT VERTEBRAL ARTERY:  Right vertebral artery flow is antegrade.  LEFT CAROTID ARTERY: There is atherosclerotic calcification in the left bifurcation region causing approximately 20% diameter stenosis. There is mild nearby mucosal thickening without appreciable obstruction in the left internal carotid artery. No stenosis approaching 50% diameter is seen on the left. LEFT VERTEBRAL ARTERY:  Left vertebral artery flow is antegrade. IMPRESSION: Mild calcified plaque in each bifurcation region. No hemodynamically significant obstruction is appreciated on either side by either real-time interrogation or bile waveform criteria. Flow in each vertebral artery is in the anatomic direction. Electronically Signed   By: Lowella Grip III M.D.   On: 11/07/2015 19:16     ASSESSMENT AND PLAN:   * Syncope Likely due to orthostatic syncope. Also had hypokalemia and hypomagnesemia which can cause arrhythmias. No abnormality on telemetry Echocardiogram is normal.  * Hypokalemia and Hypomagnesemia Replaced  * Right 6th rib and substernal fracture- nondisplaced No internal injuries on CT scan Pain control. Increase dose of pain medications Added incentive spirometer  * Hypertension. Continue hypertension medication.  * Diabetes. Start sliding scale.  All the records are reviewed and case discussed with Care Management/Social Workerr. Management plans discussed with the patient, family and they are in  agreement.  CODE STATUS: DNR  DVT Prophylaxis: SCDs  TOTAL TIME TAKING CARE OF THIS PATIENT: 35 minutes.   Likely discharge tomorrow once pain is improved with home health PT.  Hillary Bow R M.D on 11/08/2015 at 12:12 PM  Between 7am to 6pm - Pager - [phone]  After 6pm go to [email or web address] - password [email or web address] [facility]  Office  [phone]  CC: Primary care physician; Tracie Harrier, MD  Note: This dictation was prepared with Dragon dictation along with smaller phrase technology. Any transcriptional errors that result from this process are unintentional.

## 2015-11-08 NOTE — Evaluation (Signed)
Physical Therapy Evaluation Patient Details Name: Jordan Brewer MRN: [id number] DOB: 11-19-1940 Today's Date: 11/08/2015   History of Present Illness  presented to ER secondary to fall (passed out) with head laceration; admitted with syncopal episode (of unknown origin), non-displaced R 6th rib fracture and non-displaced sternal fracture.  Of note, recent hospitalization (approx one week prior) for rectal bleeding/GIB.  Clinical Impression  Upon evaluation, patient alert and oriented; eager for OOB activities.  Reports pain in mid-sternum (non-cardiac), but not a barrier to mobility efforts.  Educated in movement precautions and strategies (log-rolling, avoid pushing with bilat UEs) to protect sternum and minimize pain; patient very receptive and quickly integrates into movement strategies.  Currently requiring min assist for bed mobility; close sup for sit/stand, basic transfers and gait (29') with RW.  Slow, but steady, with good awareness of deficits and need for rest periods/activity pacing as appropriate.  SaO2 >89% with exertion on RA; HR elevated to 141 (peak) with exertion (asymptomatic). but quickly returns to baseline with seated rest. Would benefit from skilled PT to address above deficits and promote optimal return to PLOF; Recommend transition to [place] upon discharge from acute hospitalization.     Follow Up Recommendations Home health PT    Equipment Recommendations  Rolling walker with 5" wheels    Recommendations for Other Services       Precautions / Restrictions Precautions Precautions: Fall Restrictions Weight Bearing Restrictions: No      Mobility  Bed Mobility Overal bed mobility: Needs Assistance Bed Mobility: Supine to Sit     Supine to sit: Min assist     General bed mobility comments: educated in log rolling technique, cued to avoid pressure through bilat UEs to protect sternum  Transfers Overall transfer level: Needs assistance Equipment used:  Rolling walker (2 wheeled) Transfers: Sit to/from Stand Sit to Stand: Supervision         General transfer comment: Supervision for safety and cues for hand placement.  Ambulation/Gait Ambulation/Gait assistance: Supervision Ambulation Distance (Feet): 50 Feet Assistive device: Rolling walker (2 wheeled)     Gait velocity interpretation: <1.8 ft/sec, indicative of risk for recurrent falls General Gait Details: slow, but steady, cadence and overall gait speed. No buckling or LOB; no significant safety concerns.  Patient pleased with effort/performance; demonstrates good ability to pace activity and limit activity/initiate rest periods as needed.  SaO2 >89% with exertion on RA; HR elevated to 141 (peak) with exertion (asymptomatic). but quickly returns to baseline with seated rest.  Stairs            Wheelchair Mobility    Modified Rankin (Stroke Patients Only)       Balance Overall balance assessment: Needs assistance Sitting-balance support: No upper extremity supported;Feet supported Sitting balance-Leahy Scale: Good     Standing balance support: Bilateral upper extremity supported Standing balance-Leahy Scale: Good                               Pertinent Vitals/Pain Pain Assessment: Faces Faces Pain Scale: Hurts even more Pain Location: chest/sternum Pain Descriptors / Indicators: Aching;Grimacing;Guarding Pain Intervention(s): Monitored during session;Repositioned;Limited activity within patient's tolerance (educated in compensatory movement strategies)    Home Living Family/patient expects to be discharged to:: Private residence Living Arrangements: Alone Available Help at Discharge: Friend(s);Family;Available PRN/intermittently Type of Home: House Home Access: Stairs to enter Entrance Stairs-Rails: Can reach both;Left;Right Entrance Stairs-Number of Steps: 3 Home Layout: One level Home Equipment: Gilford Rile -  2 wheels;Cane - quad;Shower  seat;Bedside commode      Prior Function Level of Independence: Independent with assistive device(s)         Comments: Ambulates with quad cane inside home and RW in community; has experienced 2 falls within previous two weeks (both ending in hospitalization)     Hand Dominance   Dominant Hand: Right    Extremity/Trunk Assessment   Upper Extremity Assessment: Overall WFL for tasks assessed (bilat elevation limited to approx 90 degrees due to chronic RTC insufficiency)           Lower Extremity Assessment: Overall WFL for tasks assessed (grossly at least 4/5 throughout; no pain reported)         Communication   Communication: No difficulties  Cognition Arousal/Alertness: Awake/alert Behavior During Therapy: WFL for tasks assessed/performed Overall Cognitive Status: Within Functional Limits for tasks assessed                      General Comments      Exercises     Assessment/Plan    PT Assessment Patient needs continued PT services  PT Problem List Decreased strength;Decreased range of motion;Decreased balance;Decreased mobility;Decreased activity tolerance;Decreased knowledge of use of DME;Decreased knowledge of precautions;Cardiopulmonary status limiting activity;Pain          PT Treatment Interventions DME instruction;Gait training;Stair training;Functional mobility training;Therapeutic activities;Therapeutic exercise;Balance training;Patient/family education    PT Goals (Current goals can be found in the Care Plan section)  Acute Rehab PT Goals Patient Stated Goal: to get up and move around PT Goal Formulation: With patient Time For Goal Achievement: 11/22/15 Potential to Achieve Goals: Good    Frequency Min 2X/week   Barriers to discharge Decreased caregiver support      Co-evaluation               End of Session Equipment Utilized During Treatment: Gait belt Activity Tolerance: Patient tolerated treatment well Patient left: in  chair;with call bell/phone within reach;with chair alarm set Nurse Communication: Mobility status (O2 response to activity)    Functional Assessment Tool Used: clinical judgement Functional Limitation: Mobility: Walking and moving around Mobility: Walking and Moving Around Current Status [phone]): At least 20 percent but less than 40 percent impaired, limited or restricted Mobility: Walking and Moving Around Goal Status [phone]): At least 1 percent but less than 20 percent impaired, limited or restricted    Time: 0950-1015 PT Time Calculation (min) (ACUTE ONLY): 25 min   Charges:   PT Evaluation $PT Eval Low Complexity: 1 Procedure PT Treatments $Therapeutic Activity: 8-22 mins   PT G Codes:   PT G-Codes **NOT FOR INPATIENT CLASS** Functional Assessment Tool Used: clinical judgement Functional Limitation: Mobility: Walking and moving around Mobility: Walking and Moving Around Current Status [id number]): At least 20 percent but less than 40 percent impaired, limited or restricted Mobility: Walking and Moving Around Goal Status [phone]): At least 1 percent but less than 20 percent impaired, limited or restricted    Idalia Allbritton H. Owens Shark, PT, DPT, NCS 11/08/15, 11:01 AM [phone]

## 2015-11-08 NOTE — Care Management (Signed)
patient lives alone.  she had a fall last week at home, then fell in her bathroom 10.30, hitting the tub.  Says she lost her balance. has a walker at home. Until this occurrence, she was independent in her adls and drove short distances.  she does not have medica alert. Has four adult children- two she distances herself from because they use drug.  The other two - work and have their own families.  Patient is stating she has never been in such pain.  Verbalized understanding to use her incentive spirometer.  She would be agreeable to home health.

## 2015-11-09 DIAGNOSIS — R55 Syncope and collapse: Secondary | ICD-10-CM | POA: Diagnosis not present

## 2015-11-09 DIAGNOSIS — S2239XA Fracture of one rib, unspecified side, initial encounter for closed fracture: Secondary | ICD-10-CM | POA: Diagnosis not present

## 2015-11-09 DIAGNOSIS — E876 Hypokalemia: Secondary | ICD-10-CM | POA: Diagnosis not present

## 2015-11-09 DIAGNOSIS — I10 Essential (primary) hypertension: Secondary | ICD-10-CM | POA: Diagnosis not present

## 2015-11-09 LAB — GLUCOSE, CAPILLARY
GLUCOSE-CAPILLARY: 128 mg/dL — AB (ref 65–99)
GLUCOSE-CAPILLARY: 155 mg/dL — AB (ref 65–99)
GLUCOSE-CAPILLARY: 140 mg/dL — AB (ref 65–99)
Glucose-Capillary: 216 mg/dL — ABNORMAL HIGH (ref 65–99)

## 2015-11-09 MED ORDER — OXYCODONE HCL 5 MG PO TABS
15.0000 mg | ORAL_TABLET | ORAL | Status: DC | PRN
  Administered 2015-11-09 – 2015-11-11 (×10): 15 mg via ORAL
  Filled 2015-11-09 (×10): qty 3

## 2015-11-09 MED ORDER — INSULIN ASPART 100 UNIT/ML ~~LOC~~ SOLN
3.0000 [IU] | Freq: Three times a day (TID) | SUBCUTANEOUS | Status: DC
  Administered 2015-11-09 – 2015-11-11 (×6): 3 [IU] via SUBCUTANEOUS
  Filled 2015-11-09 (×6): qty 3

## 2015-11-09 NOTE — Progress Notes (Signed)
Inpatient Diabetes Program Recommendations  AACE/ADA: New Consensus Statement on Inpatient Glycemic Control (2015)  Target Ranges:  Prepandial:   less than 140 mg/dL      Peak postprandial:   less than 180 mg/dL (1-2 hours)      Critically ill patients:  140 - 180 mg/dL   Lab Results  Component Value Date   GLUCAP 128 (H) 11/09/2015   HGBA1C 10.4 (H) 11/13/2014    Review of Glycemic Control:  Results for CRIS, HOLVERSON (MRN [id number]) as of 11/09/2015 10:52  Ref. Range 11/08/2015 07:39 11/08/2015 12:01 11/08/2015 16:45 11/08/2015 20:03 11/09/2015 07:32  Glucose-Capillary Latest Ref Range: 65 - 99 mg/dL 137 (H) 202 (H) 270 (H) 133 (H) 128 (H)   Diabetes history: Type 2 diabetes Outpatient Diabetes medications: Lantus 10 units q HS, Novolog 8 units with breakfast, 10 units noon, and 12 units with supper Current orders for Inpatient glycemic control:  Lantus 10 units q HS, Novolog sensitive tid with meals and HS  Inpatient Diabetes Program Recommendations:   Please add Novolog meal coverage 5 units tid with meals.   Thanks, Adah Perl, RN, BC-ADM Inpatient Diabetes Coordinator Pager [phone] (8a-5p)

## 2015-11-09 NOTE — Progress Notes (Signed)
OT Cancellation Note  Patient Details Name: Jordan Brewer MRN: [id number] DOB: 12-Sep-1940   Cancelled Treatment:    Reason Eval/Treat Not Completed: Pain limiting ability to participate.  Order received and chart reviewed.  Pt lying in bed holding her chest and stated pain is 10/10 and declined any therapy until pain medication received and allowed time to work before she would get out of bed.  Will attempt again later today or tomorrow. Thank you for the referral.  Chrys Racer, OTR/L ascom [phone] 11/09/15, 10:50 AM

## 2015-11-09 NOTE — Progress Notes (Signed)
[place] [facility] [place] NAME: Jordan Brewer    MR#:  [id number]  DATE OF BIRTH:  05-31-1940  SUBJECTIVE:  CHIEF COMPLAINT:   Chief Complaint  Patient presents with  . Chest Pain   Continues to have severe pain in central chest area. Unable to take a breath. Lives alone. Barely able to walk.  REVIEW OF SYSTEMS:    Review of Systems  Constitutional: Positive for malaise/fatigue. Negative for chills and fever.  HENT: Negative for sore throat.   Eyes: Negative for blurred vision, double vision and pain.  Respiratory: Negative for cough, hemoptysis, shortness of breath and wheezing.   Cardiovascular: Positive for chest pain. Negative for palpitations, orthopnea and leg swelling.  Gastrointestinal: Negative for abdominal pain, constipation, diarrhea, heartburn, nausea and vomiting.  Genitourinary: Negative for dysuria and hematuria.  Musculoskeletal: Positive for joint pain. Negative for back pain.  Skin: Negative for rash.  Neurological: Positive for weakness. Negative for sensory change, speech change, focal weakness and headaches.  Endo/Heme/Allergies: Does not bruise/bleed easily.  Psychiatric/Behavioral: Negative for depression. The patient is not nervous/anxious.     DRUG ALLERGIES:   Allergies  Allergen Reactions  . Codeine Nausea And Vomiting  . 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 Promethazine Nausea And Vomiting  . Lidoderm [Lidocaine] Rash    VITALS:  Blood pressure 127/68, pulse (!) 105, temperature 98.5 F (36.9 C), temperature source Oral, resp. rate 17, height 5\' 4"  (1.626 m), weight 77.7 kg (171 lb 4.8 oz), SpO2 96 %.  PHYSICAL EXAMINATION:   Physical Exam  GENERAL:  75 y.o.-year-old patient lying in the bed with no acute distress.  EYES: Pupils equal, round, reactive to light and accommodation. No scleral icterus. Extraocular muscles intact.  HEENT: Head atraumatic,  normocephalic. Oropharynx and nasopharynx clear.  NECK:  Supple, no jugular venous distention. No thyroid enlargement, no tenderness.  LUNGS: Basilar crackles. Tenderness middle and right side of chest. CARDIOVASCULAR: S1, S2 normal. No murmurs, rubs, or gallops.  ABDOMEN: Soft, nontender, nondistended. Bowel sounds present. No organomegaly or mass.  EXTREMITIES: No cyanosis, clubbing or edema b/l.    NEUROLOGIC: Cranial nerves II through XII are intact. No focal Motor or sensory deficits b/l.   PSYCHIATRIC: The patient is alert and oriented x 3.  SKIN: No obvious rash, lesion, or ulcer.   LABORATORY PANEL:   CBC  Recent Labs Lab 11/08/15 0329  WBC 7.1  HGB 10.4*  HCT 32.2*  PLT 341   ------------------------------------------------------------------------------------------------------------------ Chemistries   Recent Labs Lab 11/07/15 0842 11/08/15 0329  NA 137 138  K 2.9* 3.5  CL 102 108  CO2 24 25  GLUCOSE 179* 171*  BUN 8 6  CREATININE 0.91 0.81  CALCIUM 8.9 7.7*  MG 1.4* 2.2  AST 25  --   ALT 13*  --   ALKPHOS 71  --   BILITOT 0.4  --    ------------------------------------------------------------------------------------------------------------------  Cardiac Enzymes  Recent Labs Lab 11/07/15 0842  TROPONINI <0.03   ------------------------------------------------------------------------------------------------------------------  RADIOLOGY:  US Carotid Bilateral  Result Date: 11/07/2015 CLINICAL DATA:  Syncope EXAM: BILATERAL CAROTID DUPLEX ULTRASOUND TECHNIQUE: Pearline Cables scale imaging, color Doppler and duplex ultrasound were performed of bilateral carotid and vertebral arteries in the neck. COMPARISON:  None. FINDINGS: Criteria: Quantification of carotid stenosis is based on velocity parameters that correlate the residual internal carotid diameter with NASCET-based stenosis levels, using the diameter of the distal internal carotid lumen as  the denominator  for stenosis measurement. The following peak systolic velocity measurements were obtained: RIGHT ICA:  87 cm/sec CCA:  68 cm/sec SYSTOLIC ICA/CCA RATIO:  1.3 DIASTOLIC ICA/CCA RATIO:  1.1 ECA:  123 cm/sec LEFT ICA:  101 cm/sec CCA:  92 cm/sec SYSTOLIC ICA/CCA RATIO:  1.1 DIASTOLIC ICA/CCA RATIO:  1.3 ECA:  1.2 cm/sec RIGHT CAROTID ARTERY: There is mild calcification in the bifurcation on the right causing approximately 20% diameter stenosis. There is mild mucosal thickening at the origin of the internal carotid artery on the right without appreciable obstruction. Most stenosis approaching 50% diameter is appreciable on the right. RIGHT VERTEBRAL ARTERY:  Right vertebral artery flow is antegrade. LEFT CAROTID ARTERY: There is atherosclerotic calcification in the left bifurcation region causing approximately 20% diameter stenosis. There is mild nearby mucosal thickening without appreciable obstruction in the left internal carotid artery. No stenosis approaching 50% diameter is seen on the left. LEFT VERTEBRAL ARTERY:  Left vertebral artery flow is antegrade. IMPRESSION: Mild calcified plaque in each bifurcation region. No hemodynamically significant obstruction is appreciated on either side by either real-time interrogation or bile waveform criteria. Flow in each vertebral artery is in the anatomic direction. Electronically Signed   By: Lowella Grip III M.D.   On: 11/07/2015 19:16     ASSESSMENT AND PLAN:   * Syncope Likely due to orthostatic syncope. Also had hypokalemia and hypomagnesemia which can cause arrhythmias. No abnormality on telemetry Echocardiogram is normal.  * Hypokalemia and Hypomagnesemia Replaced  * Right 6th rib and substernal fracture- nondisplaced No internal injuries on CT scan. Echocardiogram showed no cardiac abnormalities. No pulmonary contusion on CT scan. Will increase dose of oxycodone to 15 mg. We will discharge home if pain is improved later in the day today or  tomorrow. Added incentive spirometer  * Hypertension. Continue hypertension medication.  * Diabetes. Started sliding scale.  All the records are reviewed and case discussed with Care Management/Social Workerr. Management plans discussed with the patient, family and they are in agreement.  CODE STATUS: DNR  DVT Prophylaxis: SCDs  TOTAL TIME TAKING CARE OF THIS PATIENT: 35 minutes.   Likely discharge tomorrow once pain is improved with home health PT.  Hillary Bow R M.D on 11/09/2015 at 12:13 PM  Between 7am to 6pm - Pager - [phone]  After 6pm go to [email or web address] - password [email or web address] [facility]  Office  [phone]  CC: Primary care physician; Tracie Harrier, MD  Note: This dictation was prepared with Dragon dictation along with smaller phrase technology. Any transcriptional errors that result from this process are unintentional.

## 2015-11-10 DIAGNOSIS — S2239XA Fracture of one rib, unspecified side, initial encounter for closed fracture: Secondary | ICD-10-CM | POA: Diagnosis not present

## 2015-11-10 DIAGNOSIS — E876 Hypokalemia: Secondary | ICD-10-CM | POA: Diagnosis not present

## 2015-11-10 DIAGNOSIS — R55 Syncope and collapse: Secondary | ICD-10-CM | POA: Diagnosis not present

## 2015-11-10 DIAGNOSIS — I10 Essential (primary) hypertension: Secondary | ICD-10-CM | POA: Diagnosis not present

## 2015-11-10 LAB — GLUCOSE, CAPILLARY
Glucose-Capillary: 158 mg/dL — ABNORMAL HIGH (ref 65–99)
Glucose-Capillary: 98 mg/dL (ref 65–99)
Glucose-Capillary: 208 mg/dL — ABNORMAL HIGH (ref 65–99)
Glucose-Capillary: 138 mg/dL — ABNORMAL HIGH (ref 65–99)

## 2015-11-10 MED ORDER — METAXALONE 400 MG HALF TABLET
400.0000 mg | ORAL_TABLET | Freq: Three times a day (TID) | ORAL | Status: DC
  Administered 2015-11-10 – 2015-11-11 (×4): 400 mg via ORAL
  Filled 2015-11-10 (×7): qty 1

## 2015-11-10 MED ORDER — POLYETHYLENE GLYCOL 3350 17 G PO PACK: 17.0000 g | PACK | Freq: Every day | ORAL | Status: DC | PRN

## 2015-11-10 MED ORDER — SENNOSIDES-DOCUSATE SODIUM 8.6-50 MG PO TABS
2.0000 | ORAL_TABLET | Freq: Two times a day (BID) | ORAL | Status: DC
  Administered 2015-11-10 – 2015-11-11 (×3): 2 via ORAL
  Filled 2015-11-10 (×3): qty 2

## 2015-11-10 MED ORDER — FENTANYL 12 MCG/HR TD PT72
12.5000 ug | MEDICATED_PATCH | TRANSDERMAL | Status: DC
  Administered 2015-11-10: 12.5 ug via TRANSDERMAL
  Filled 2015-11-10: qty 1

## 2015-11-10 NOTE — Care Management (Signed)
Patient chose [facility] for services.  Heads up referral for SN PT OT Aide and social work.  made a call to [facility] and left VM for referral.  Patient has a walker and her son will transport her home.

## 2015-11-10 NOTE — Evaluation (Addendum)
Occupational Therapy Evaluation Patient Details Name: Jordan Brewer MRN: [id number] DOB: 26-Jul-1940 Today's Date: 11/10/2015    History of Present Illness Pt. is a 75 y.o. female who was admitted to [facility] after sustaining a right rib fracture, and sternal fracture following a fall. Pt. was recently hospitalized 1 week ago with rectal bleeding.   Clinical Impression   Pt. Is a 75 y.o. Female who was admitted to [facility] after sustaining a right rib Fracture, and sternal fracture. Pt. Presents with pain, limited Bilateral UE ROM, decreased functional mobility, and decreased activity tolerance which hinder her ability to complete ADL and IADL functioning. Pt. Could benefit from skilled OT services for ADL training, A/E training, UE therapeutic Ex, andpt. Education about energy conservation, work simplification techniques, and home modification. Pt. Plans to return home upon discharge.     Follow Up Recommendations  Home health OT    Equipment Recommendations       Recommendations for Other Services PT consult     Precautions / Restrictions Precautions Precautions: Fall Restrictions Weight Bearing Restrictions: No                                                     ADL  Pt. education was provided about A/E ADLs, and energy conservation strategies. Pt. ADL functioning is limited by Pain in pain in sternum, ribs, and UEs.                                              Vision     Perception     Praxis      Pertinent Vitals/Pain Pain Assessment: 0-10 Pain Score: 10-Worst pain ever Pain Location: Chest/sternum Pain Descriptors / Indicators: Aching;Grimacing;Guarding Pain Intervention(s): Monitored during session;Repositioned;Limited activity within patient's tolerance     Hand Dominance Right   Extremity/Trunk Assessment Upper Extremity Assessment Upper Extremity Assessment: Overall WFL for tasks assessed (Limited bilateral  shoulder pain from previous shoulder injuries, and pain.)           Communication Communication Communication: No difficulties   Cognition Arousal/Alertness: Awake/alert Behavior During Therapy: WFL for tasks assessed/performed Overall Cognitive Status: Within Functional Limits for tasks assessed                     General Comments       Exercises       Shoulder Instructions      Home Living Family/patient expects to be discharged to:: Private residence Living Arrangements: Alone Available Help at Discharge: Family;Friend(s);Available PRN/intermittently Type of Home: House Home Access: Stairs to enter [employer] of Steps: 3 Entrance Stairs-Rails: Can reach both;Right;Left Home Layout: One level     Bathroom Shower/Tub: Tub/shower unit;Walk-in shower   Bathroom Toilet: Standard     Home Equipment: Environmental consultant - 2 wheels;Cane - quad;Shower seat;Bedside commode;Grab bars - tub/shower          Prior Functioning/Environment Level of Independence: Independent with assistive device(s)                 OT Problem List: Decreased strength;Decreased activity tolerance;Decreased knowledge of use of DME or AE;Impaired UE functional use;Decreased range of motion;Pain   OT Treatment/Interventions: Self-care/ADL training;Therapeutic exercise;Energy conservation;DME and/or AE instruction;Patient/family  education;Therapeutic activities    OT Goals(Current goals can be found in the care plan section) Acute Rehab OT Goals Patient Stated Goal: To return home OT Goal Formulation: With patient Potential to Achieve Goals: Good  OT Frequency: Min 1X/week   Barriers to D/C: Decreased caregiver support          Co-evaluation              End of Session    Activity Tolerance: Patient tolerated treatment well Patient left: in bed   Time: 0925-0950 OT Time Calculation (min): 25 min Charges:  OT General Charges $OT Visit: 1 Procedure OT  Evaluation $OT Eval Moderate Complexity: 1 Procedure G-Codes:    Functional Assessment Tool Used   Clinical judgement regarding pt. current functional level.   Functional Limitation   Self care   Self Care Current Status [phone])   At least 20 percent but less than 40 percent impaired, limited or restricted   Self Care Goal Status [id number])   At least 1 percent but less than 20 percent impaired, limited or restricted   Self Care Discharge Status [phone])        Harrel Carina, MS, OTR/L 11/10/2015, 10:11 AM

## 2015-11-10 NOTE — Progress Notes (Signed)
Physical Therapy Treatment Patient Details Name: Jordan Brewer MRN: [id number] DOB: Jun 28, 1940 Today's Date: 11/10/2015    History of Present Illness Pt. is a 75 y.o. female who was admitted to [facility] after sustaining a right rib fracture, and sternal fracture following a fall. Pt. was recently hospitalized 1 week ago with rectal bleeding.    PT Comments    Pt able to progress ambulation distance to 100 feet SBA with RW; pt steady without loss of balance.  Pt's HR did increase from 116 bpm at rest to 144 bpm (peak) with activity but returned to 116 bpm at end of session.  Pt very motivated to do as much as she can by herself but limited d/t chest/sternal (non-cardiac) pain.  Will continue to progress pt with functional mobility per pt tolerance.   Follow Up Recommendations  Home health PT     Equipment Recommendations  Rolling walker with 5" wheels    Recommendations for Other Services       Precautions / Restrictions Precautions Precautions: Fall Precaution Comments: monitor HR and O2 Restrictions Weight Bearing Restrictions: No Other Position/Activity Restrictions: recent sternal fx and rib fx    Mobility  Bed Mobility Overal bed mobility: Needs Assistance Bed Mobility: Supine to Sit;Sit to Supine     Supine to sit: Supervision Sit to supine: Supervision   General bed mobility comments: min vc's for logrolling technique and to avoid pressure through B UE's to protect sternum; mild increased time to perform on own  Transfers Overall transfer level: Needs assistance Equipment used: Rolling walker (2 wheeled) Transfers: Sit to/from [facility] Sit to Stand: Supervision Stand pivot transfers: Supervision (to toilet in bathroom)       General transfer comment: min vc's for hand placement  Ambulation/Gait Ambulation/Gait assistance: Supervision Ambulation Distance (Feet): 120 Feet (20 feet to bathroom; 100 feet in hallway and back to  bed) Assistive device: Rolling walker (2 wheeled) Gait Pattern/deviations: Step-through pattern;Decreased stride length;Trunk flexed Gait velocity: decreased   General Gait Details: slow steady cadence/gait speed; no LOB noted   Stairs            Wheelchair Mobility    Modified Rankin (Stroke Patients Only)       Balance Overall balance assessment: Needs assistance Sitting-balance support: No upper extremity supported;Feet supported Sitting balance-Leahy Scale: Good     Standing balance support: Bilateral upper extremity supported (on RW) Standing balance-Leahy Scale: Good Standing balance comment: steady standing to manage clothing and hygiene for toileting                    Cognition Arousal/Alertness: Awake/alert Behavior During Therapy: WFL for tasks assessed/performed Overall Cognitive Status: Within Functional Limits for tasks assessed                      Exercises      General Comments   Nursing cleared pt for participation in physical therapy.  Pt initially wanting to wait to do therapy until later but then pt changed her mind and agreeable to PT session in order to "get it over with" so she would not have to think about it.  At end of session when PT was saying bye to pt and about to leave room pt raised her R UE up from the bed and blood started to run down pt's R forearm (appeared to be from IV site).  Pressure applied immediately to IV site to stop the bleeding and nursing called immediately  and nursing came quickly to address the situation.      Pertinent Vitals/Pain Pain Assessment: 0-10 Pain Score: 9  Pain Location: chest/sternum Pain Descriptors / Indicators: Aching;Grimacing;Guarding;Sharp Pain Intervention(s): Limited activity within patient's tolerance;Monitored during session;Premedicated before session;Repositioned  O2 >93% during PT session on RA.    Home Living Family/patient expects to be discharged to:: Private  residence Living Arrangements: Alone Available Help at Discharge: Family;Friend(s);Available PRN/intermittently Type of Home: House Home Access: Stairs to enter Entrance Stairs-Rails: Can reach both;Right;Left Home Layout: One level Home Equipment: Walker - 2 wheels;Cane - quad;Shower seat;Bedside commode;Grab bars - tub/shower      Prior Function Level of Independence: Independent with assistive device(s)          PT Goals (current goals can now be found in the care plan section) Acute Rehab PT Goals Patient Stated Goal: To return home PT Goal Formulation: With patient Time For Goal Achievement: 11/22/15 Potential to Achieve Goals: Good Progress towards PT goals: Progressing toward goals    Frequency    Min 2X/week      PT Plan Current plan remains appropriate    Co-evaluation             End of Session Equipment Utilized During Treatment: Gait belt Activity Tolerance: Patient limited by pain Patient left: in bed;with call bell/phone within reach;with nursing/sitter in room (Nursing finishing assisting pt in bed)     Time: [id number] PT Time Calculation (min) (ACUTE ONLY): 23 min  Charges:  $Therapeutic Activity: 23-37 mins                    G CodesLeitha Bleak November 14, 2015, 11:53 AM Leitha Bleak, [place]

## 2015-11-10 NOTE — Progress Notes (Signed)
[place] at [place] NAME: Jordan Brewer    MR#:  [id number]  DATE OF BIRTH:  01/08/1941  SUBJECTIVE:  CHIEF COMPLAINT:   Chief Complaint  Patient presents with  . Chest Pain   -Patient with right sixth rib fracture and sternal fracture from fall, complains of significant chest pain from the fracture. Also complains that she couldn't sleep last night.  REVIEW OF SYSTEMS:  Review of Systems  Constitutional: Negative for chills and fever.  HENT: Negative for ear discharge, ear pain and tinnitus.   Eyes: Negative for blurred vision.  Respiratory: Negative for cough, shortness of breath and wheezing.   Cardiovascular: Negative for chest pain and palpitations.  Gastrointestinal: Negative for abdominal pain, constipation, diarrhea, nausea and vomiting.  Genitourinary: Negative for dysuria and urgency.  Musculoskeletal: Positive for joint pain and myalgias.  Neurological: Negative for dizziness, seizures and headaches.  Psychiatric/Behavioral: Negative for depression.    DRUG ALLERGIES:   Allergies  Allergen Reactions  . Codeine Nausea And Vomiting  . 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 Promethazine Nausea And Vomiting  . Lidoderm [Lidocaine] Rash    VITALS:  Blood pressure 127/70, pulse (!) 102, temperature 98.3 F (36.8 C), temperature source Oral, resp. rate 20, height 5\' 4"  (1.626 m), weight 77.7 kg (171 lb 4.8 oz), SpO2 94 %.  PHYSICAL EXAMINATION:  Physical Exam  GENERAL:  75 y.o.-year-old patient sitting in the bed with no acute distress.  EYES: Pupils equal, round, reactive to light and accommodation. No scleral icterus. Extraocular muscles intact.  HEENT: Head atraumatic, normocephalic. Oropharynx and nasopharynx clear.  NECK:  Supple, no jugular venous distention. No thyroid enlargement, no tenderness.  LUNGS: Normal breath sounds bilaterally, no wheezing, rales,rhonchi or  crepitation. No use of accessory muscles of respiration.  CARDIOVASCULAR: S1, S2 normal. No murmurs, rubs, or gallops. Tender in the mid chest, no erythema noted or bruising noted. ABDOMEN: Soft, nontender, nondistended. Bowel sounds present. No organomegaly or mass.  EXTREMITIES: No pedal edema, cyanosis, or clubbing.  NEUROLOGIC: Cranial nerves II through XII are intact. Muscle strength 5/5 in all extremities. Sensation intact. Gait not checked.  PSYCHIATRIC: The patient is alert and oriented x 3.  SKIN: No obvious rash, lesion, or ulcer.    LABORATORY PANEL:   CBC  Recent Labs Lab 11/08/15 0329  WBC 7.1  HGB 10.4*  HCT 32.2*  PLT 341   ------------------------------------------------------------------------------------------------------------------  Chemistries   Recent Labs Lab 11/07/15 0842 11/08/15 0329  NA 137 138  K 2.9* 3.5  CL 102 108  CO2 24 25  GLUCOSE 179* 171*  BUN 8 6  CREATININE 0.91 0.81  CALCIUM 8.9 7.7*  MG 1.4* 2.2  AST 25  --   ALT 13*  --   ALKPHOS 71  --   BILITOT 0.4  --    ------------------------------------------------------------------------------------------------------------------  Cardiac Enzymes  Recent Labs Lab 11/07/15 0842  TROPONINI <0.03   ------------------------------------------------------------------------------------------------------------------  RADIOLOGY:  No results found.  EKG:   Orders placed or performed during the hospital encounter of 11/07/15  . EKG 12-Lead  . EKG 12-Lead    ASSESSMENT AND PLAN:   75 year old female with past medical history significant for hypertension, diabetes, hyperlipidemia and chronic back pain presents to the hospital after a fall and noted to have right rib and sternal fracture.  #1 chest pain-secondary to right sixth rib anterior and substernal cortex fracture. No other internal injuries  noted. -Hemodynamically stable. Echocardiogram with no cardiac abnormalities. -CT  with no pulmonary contusion. -Add Skelaxin for neck pain and muscular pain, added fentanyl patch is on high doses of by mouth oxycodone. -Physical therapy recommended home health. -Continue incentive spirometer. Discharge tomorrow.  #2 syncope-orthostatic syncope. Dehydration and electrolyte abnormalities. -Resolved. No arrhythmias on telemetry [place]. Echo is normal  #3 hypokalemia and hypomagnesemia-replaced.  #4 hypertension-on Norvasc, lisinopril. Also takes Florinef and hydrocortisone.  #5 diabetes mellitus-on Lantus and aspart and sliding scale  #6 DVT prophylaxis-Lovenox  #7 oral thrush-continue nystatin     All the records are reviewed and case discussed with Care Management/Social Workerr. Management plans discussed with the patient, family and they are in agreement.  CODE STATUS: DO NOT RESUSCITATE  TOTAL TIME TAKING CARE OF THIS PATIENT: 37 minutes.   POSSIBLE D/C TOMORROW, DEPENDING ON CLINICAL CONDITION.   Gladstone Lighter M.D on 11/10/2015 at 9:49 AM  Between 7am to 6pm - Pager - [phone]  After 6pm go to [email or web address] - password [email or web address] [facility]  Office  [phone]  CC: Primary care physician; Tracie Harrier, MD

## 2015-11-11 DIAGNOSIS — I10 Essential (primary) hypertension: Secondary | ICD-10-CM | POA: Diagnosis not present

## 2015-11-11 DIAGNOSIS — S2239XA Fracture of one rib, unspecified side, initial encounter for closed fracture: Secondary | ICD-10-CM | POA: Diagnosis not present

## 2015-11-11 DIAGNOSIS — R55 Syncope and collapse: Secondary | ICD-10-CM | POA: Diagnosis not present

## 2015-11-11 DIAGNOSIS — E876 Hypokalemia: Secondary | ICD-10-CM | POA: Diagnosis not present

## 2015-11-11 LAB — BASIC METABOLIC PANEL
Anion gap: 5 (ref 5–15)
BUN: 11 mg/dL (ref 6–20)
CHLORIDE: 106 mmol/L (ref 101–111)
CO2: 29 mmol/L (ref 22–32)
CREATININE: 0.8 mg/dL (ref 0.44–1.00)
Calcium: 8.8 mg/dL — ABNORMAL LOW (ref 8.9–10.3)
GFR calc non Af Amer: >60 mL/min (ref >60)
Glucose, Bld: 169 mg/dL — ABNORMAL HIGH (ref 65–99)
POTASSIUM: 4 mmol/L (ref 3.5–5.1)
SODIUM: 140 mmol/L (ref 135–145)

## 2015-11-11 LAB — GLUCOSE, CAPILLARY
GLUCOSE-CAPILLARY: 165 mg/dL — AB (ref 65–99)
GLUCOSE-CAPILLARY: 135 mg/dL — AB (ref 65–99)

## 2015-11-11 MED ORDER — METOPROLOL TARTRATE 50 MG PO TABS: 50.0000 mg | ORAL_TABLET | Freq: Two times a day (BID) | ORAL | Status: DC

## 2015-11-11 MED ORDER — FENTANYL 12 MCG/HR TD PT72: 12.5000 ug | MEDICATED_PATCH | TRANSDERMAL | 0 refills | Status: DC

## 2015-11-11 MED ORDER — NYSTATIN 100000 UNIT/ML MT SUSP: 5.0000 mL | Freq: Four times a day (QID) | OROMUCOSAL | 0 refills | Status: DC

## 2015-11-11 MED ORDER — METOPROLOL TARTRATE 50 MG PO TABS: 50.0000 mg | ORAL_TABLET | Freq: Two times a day (BID) | ORAL | 2 refills | Status: DC

## 2015-11-11 MED ORDER — HYDROMORPHONE HCL 2 MG PO TABS: 2.0000 mg | ORAL_TABLET | ORAL | Status: DC | PRN

## 2015-11-11 MED ORDER — METOPROLOL TARTRATE 25 MG PO TABS
25.0000 mg | ORAL_TABLET | Freq: Two times a day (BID) | ORAL | Status: DC
  Administered 2015-11-11: 25 mg via ORAL
  Filled 2015-11-11: qty 1

## 2015-11-11 MED ORDER — POLYETHYLENE GLYCOL 3350 17 G PO PACK
17.0000 g | PACK | Freq: Every day | ORAL | 0 refills | Status: DC | PRN
Start: 2015-11-11 — End: 2016-09-28

## 2015-11-11 MED ORDER — NYSTATIN 100000 UNIT/GM EX POWD
Freq: Two times a day (BID) | CUTANEOUS | Status: DC
  Administered 2015-11-11: 15:00:00 via TOPICAL
  Filled 2015-11-11: qty 15

## 2015-11-11 MED ORDER — METAXALONE 400 MG PO TABS: 400.0000 mg | ORAL_TABLET | Freq: Three times a day (TID) | ORAL | 0 refills | Status: DC

## 2015-11-11 MED ORDER — NYSTATIN 100000 UNIT/GM EX POWD: Freq: Two times a day (BID) | CUTANEOUS | 0 refills | Status: DC

## 2015-11-11 MED ORDER — LISINOPRIL 20 MG PO TABS
20.0000 mg | ORAL_TABLET | Freq: Every day | ORAL | Status: DC
  Administered 2015-11-11: 20 mg via ORAL
  Filled 2015-11-11: qty 1

## 2015-11-11 MED ORDER — HYDROMORPHONE HCL 2 MG PO TABS: 2.0000 mg | ORAL_TABLET | Freq: Four times a day (QID) | ORAL | 0 refills | Status: DC | PRN

## 2015-11-11 NOTE — Care Management (Signed)
CM left a second voicemail message for Med Alert through [facility]. For discharge home.  Notified [facility]

## 2015-11-11 NOTE — Discharge Summary (Signed)
[facility] NAME: Jordan Brewer    MR#:  [id number]  DATE OF BIRTH:  [birth year]  DATE OF ADMISSION:  11/07/2015   ADMITTING PHYSICIAN: Demetrios Loll, MD  DATE OF DISCHARGE: 11/11/2015  PRIMARY CARE PHYSICIAN: Tracie Harrier, MD   ADMISSION DIAGNOSIS:   Closed fracture of body of sternum, initial encounter [S22.22XA] Closed fracture of one rib of right side, initial encounter [S22.31XA] Syncope, unspecified syncope type [R55]  DISCHARGE DIAGNOSIS:   Active Problems:   Syncope   SECONDARY DIAGNOSIS:   Past Medical History:  Diagnosis Date  . Addison's disease ([place])   . Ankle fracture, left 02/2012  . Arthritis    "all over my body" (04/10/2012)  . Bipolar depression ([place])   . Cancer ([place])   . Chronic back pain    "since my hip OR in 09/2010"  . Depression   . DVT, lower extremity ([place]) 09/2010   "left; after hip replacement" (04/10/2012)  . Fall at home 02/2012   'foot got caught on heating pad cord; broke left ankle" (04/10/2012)  . GERD (gastroesophageal reflux disease)   . History of blood transfusion   . Hypercholesteremia   . Hypertension   . Syncope and collapse 04/10/2012   "w/loss of consciousness" (04/10/2012)  . Type II diabetes mellitus [facility])     HOSPITAL COURSE:   75 year old female with past medical history significant for hypertension, diabetes, hyperlipidemia and chronic back pain presents to the hospital after a fall and noted to have right rib and sternal fracture.  #1 chest pain-secondary to right sixth rib anterior and substernal cortex fracture. No other internal injuries noted. -Hemodynamically stable. Echocardiogram with no cardiac abnormalities. -CT with no pulmonary contusion. -Added Skelaxin for neck pain and muscular pain, added fentanyl patch, also oral dilaudid for pain control- patient has high tolerance for pain medications. Only a very few pills have been dispensed and requested to follow  up with PCP. -Physical therapy recommended home health. -Continue incentive spirometer. Discharge today.  #2 syncope-orthostatic syncope. Dehydration and electrolyte abnormalities. -Resolved. No arrhythmias on telemetry. Echo is normal  #3 hypokalemia and hypomagnesemia-replaced.  #4 hypertension-on Norvasc, metoprolol added at discharge. Also takes Florinef and hydrocortisone.  #5 diabetes mellitus-on Lantus, trulicity and aspart insulin  #6 oral thrush-continue nystatin  Patient is able to ambulate with physical therapy. They have recommended home health  DISCHARGE CONDITIONS:   Stable  CONSULTS OBTAINED:   None  DRUG ALLERGIES:   Allergies  Allergen Reactions  . Codeine Nausea And Vomiting  . 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 Promethazine Nausea And Vomiting  . Lidoderm [Lidocaine] Rash   DISCHARGE MEDICATIONS:     Medication List    STOP taking these medications   lisinopril 20 MG tablet Commonly known as:  PRINIVIL,ZESTRIL   oxyCODONE-acetaminophen 5-325 MG tablet Commonly known as:  PERCOCET/ROXICET   SYSTANE OP     TAKE these medications   alum & mag hydroxide-simeth [id number] MG/5ML suspension Commonly known as:  MAALOX/MYLANTA Take 30 mLs by mouth every 6 (six) hours as needed for indigestion or heartburn.   amLODipine 10 MG tablet Commonly known as:  NORVASC Take 10 mg by mouth daily.   docusate sodium 100 MG capsule Commonly known as:  COLACE Take 1 capsule (100 mg total) by mouth 2 (two) times daily. What changed:  when to take this  reasons to take this   DULoxetine 60  MG capsule Commonly known as:  CYMBALTA Take 60 mg by mouth daily.   fentaNYL 12 MCG/HR Commonly known as:  DURAGESIC - dosed mcg/hr Place 1 patch (12.5 mcg total) onto the skin every 3 (three) days. Start taking on:  11/13/2015   fludrocortisone 0.1 MG tablet Commonly known as:  FLORINEF Take 0.1 mg by mouth  daily.   hydrocortisone 10 MG tablet Commonly known as:  CORTEF Take 10-20 mg by mouth 2 (two) times daily. 20 mg every morning and 10 mg at bedtime   HYDROmorphone 2 MG tablet Commonly known as:  DILAUDID Take 1 tablet (2 mg total) by mouth every 6 (six) hours as needed for severe pain.   insulin aspart 100 UNIT/ML injection Commonly known as:  novoLOG Inject into the skin 3 (three) times daily before meals. Per sliding scale 8 units am,10 units noon, 12 units pm   insulin glargine 100 unit/mL Sopn Commonly known as:  LANTUS Inject 10 Units into the skin at bedtime.   lovastatin 40 MG tablet Commonly known as:  MEVACOR Take 40 mg by mouth at bedtime.   metaxalone 400 MG tablet Commonly known as:  SKELAXIN Take 1 tablet (400 mg total) by mouth 3 (three) times daily. X 10 days   metoprolol 50 MG tablet Commonly known as:  LOPRESSOR Take 1 tablet (50 mg total) by mouth 2 (two) times daily.   nystatin 100000 UNIT/ML suspension Commonly known as:  MYCOSTATIN Take 5 mLs (500,000 Units total) by mouth 4 (four) times daily.   nystatin powder Commonly known as:  MYCOSTATIN/NYSTOP Apply topically 2 (two) times daily.   omeprazole 40 MG capsule Commonly known as:  PRILOSEC Take 40 mg by mouth 2 (two) times daily.   polyethylene glycol packet Commonly known as:  MIRALAX / GLYCOLAX Take 17 g by mouth daily as needed for mild constipation.   TRULICITY [id number] Sopn Generic drug:  Dulaglutide Inject 0.5 mLs into the skin every Wednesday.        DISCHARGE INSTRUCTIONS:   1. PCP f/u in 1-2 weeks  DIET:   Cardiac diet  ACTIVITY:   Activity as tolerated  OXYGEN:   Home Oxygen: No.  Oxygen Delivery: room air  DISCHARGE LOCATION:   home   If you experience worsening of your admission symptoms, develop shortness of breath, life threatening emergency, suicidal or homicidal thoughts you must seek medical attention immediately by calling 911 or calling your MD  immediately  if symptoms less severe.  You Must read complete instructions/literature along with all the possible adverse reactions/side effects for all the Medicines you take and that have been prescribed to you. Take any new Medicines after you have completely understood and accpet all the possible adverse reactions/side effects.   Please note  You were cared for by a hospitalist during your hospital stay. If you have any questions about your discharge medications or the care you received while you were in the hospital after you are discharged, you can call the unit and asked to speak with the hospitalist on call if the hospitalist that took care of you is not available. Once you are discharged, your primary care physician will handle any further medical issues. Please note that NO REFILLS for any discharge medications will be authorized once you are discharged, as it is imperative that you return to your primary care physician (or establish a relationship with a primary care physician if you do not have one) for your aftercare needs so that they  can reassess your need for medications and monitor your lab values.    On the day of Discharge:  VITAL SIGNS:   Blood pressure 136/72, pulse 100, temperature 98.6 F (37 C), temperature source Oral, resp. rate 18, height 5\' 4"  (1.626 m), weight 77.7 kg (171 lb 4.8 oz), SpO2 96 %.  PHYSICAL EXAMINATION:   GENERAL:  75 y.o.-year-old patient sitting in the bed with no acute distress.  EYES: Pupils equal, round, reactive to light and accommodation. No scleral icterus. Extraocular muscles intact.  HEENT: Head atraumatic, normocephalic. Oropharynx and nasopharynx clear.  NECK:  Supple, no jugular venous distention. No thyroid enlargement, no tenderness.  LUNGS: Normal breath sounds bilaterally, no wheezing, rales,rhonchi or crepitation. No use of accessory muscles of respiration.  CARDIOVASCULAR: S1, S2 normal. No murmurs, rubs, or gallops. Tender in the  mid chest, no erythema noted or bruising noted. ABDOMEN: Soft, nontender, nondistended. Bowel sounds present. No organomegaly or mass.  EXTREMITIES: No pedal edema, cyanosis, or clubbing.  NEUROLOGIC: Cranial nerves II through XII are intact. Muscle strength 5/5 in all extremities. Sensation intact. Gait not checked.  PSYCHIATRIC: The patient is alert and oriented x 3.  SKIN: No obvious rash, lesion, or ulcer.   DATA REVIEW:   CBC  Recent Labs Lab 11/08/15 0329  WBC 7.1  HGB 10.4*  HCT 32.2*  PLT 341    Chemistries   Recent Labs Lab 11/07/15 0842 11/08/15 0329 11/11/15 0501  NA 137 138 140  K 2.9* 3.5 4.0  CL 102 108 106  CO2 24 25 29   GLUCOSE 179* 171* 169*  BUN 8 6 11   CREATININE 0.91 0.81 0.80  CALCIUM 8.9 7.7* 8.8*  MG 1.4* 2.2  --   AST 25  --   --   ALT 13*  --   --   ALKPHOS 71  --   --   BILITOT 0.4  --   --      Microbiology Results  Results for orders placed or performed during the hospital encounter of 10/30/15  CULTURE, BLOOD (ROUTINE X 2) w Reflex to ID Panel     Status: None   Collection Time: 10/30/15  1:53 PM  Result Value Ref Range Status   Specimen Description BLOOD LEFT ASSIST CONTROL  Final   Special Requests BOTTLES DRAWN AEROBIC AND ANAEROBIC 5CC  Final   Culture NO GROWTH 5 DAYS  Final   Report Status 11/04/2015 FINAL  Final  CULTURE, BLOOD (ROUTINE X 2) w Reflex to ID Panel     Status: None   Collection Time: 10/30/15  1:53 PM  Result Value Ref Range Status   Specimen Description BLOOD LEFT HAND  Final   Special Requests BOTTLES DRAWN AEROBIC AND ANAEROBIC 5CC  Final   Culture NO GROWTH 5 DAYS  Final   Report Status 11/04/2015 FINAL  Final    RADIOLOGY:  No results found.   Management plans discussed with the patient, family and they are in agreement.  CODE STATUS:     Code Status Orders        Start     Ordered   11/07/15 1332  Do not attempt resuscitation (DNR)  Continuous    Question Answer Comment  In the event  of cardiac or respiratory ARREST Do not call a "code blue"   In the event of cardiac or respiratory ARREST Do not perform Intubation, CPR, defibrillation or ACLS   In the event of cardiac or respiratory ARREST Use medication by any  route, position, wound care, and other measures to relive pain and suffering. May use oxygen, suction and manual treatment of airway obstruction as needed for comfort.      11/07/15 1331    Code Status History    Date Active Date Inactive Code Status Order ID Comments User Context   10/30/2015  1:20 PM 11/02/2015  5:23 PM Full Code [id number]  Lytle Butte, MD ED   11/13/2014  5:38 PM 11/16/2014  7:20 PM Full Code [id number]  Nicholes Mango, MD Inpatient   04/10/2012  6:13 PM 04/11/2012  9:57 PM Full Code [id number]  Charlynne Cousins, MD Inpatient      TOTAL TIME TAKING CARE OF THIS PATIENT: 37 minutes.    Gladstone Lighter M.D on 11/11/2015 at 2:13 PM  Between 7am to 6pm - Pager - [phone]  After 6pm go to [email or web address] - Proofreader  [facility]  Office  [phone]  CC: Primary care physician; Tracie Harrier, MD   Note: This dictation was prepared with Dragon dictation along with smaller phrase technology. Any transcriptional errors that result from this process are unintentional.

## 2015-11-11 NOTE — Progress Notes (Signed)
Pt. Discharged to home via wc. Discharge instructions and medication regimen reviewed at bedside with patient. Pt. verbalizes understanding of instructions and medication regimen. Narcotic prescriptions included with d/c papers (all others at pharmacy), DNR form included. Patient assessment unchanged from this morning. TELE and IV discontinued per policy.

## 2015-11-14 ENCOUNTER — Emergency Department: Payer: Commercial Managed Care - HMO

## 2015-11-14 ENCOUNTER — Observation Stay
Admission: EM | Admit: 2015-11-14 | Discharge: 2015-11-16 | Disposition: A | Discharge status: Home Health | Payer: Commercial Managed Care - HMO | Attending: Internal Medicine | Admitting: Internal Medicine

## 2015-11-14 DIAGNOSIS — Z79899 Other long term (current) drug therapy: Secondary | ICD-10-CM | POA: Diagnosis not present

## 2015-11-14 DIAGNOSIS — S2222XA Fracture of body of sternum, initial encounter for closed fracture: Principal | ICD-10-CM | POA: Insufficient documentation

## 2015-11-14 DIAGNOSIS — K219 Gastro-esophageal reflux disease without esophagitis: Secondary | ICD-10-CM | POA: Insufficient documentation

## 2015-11-14 DIAGNOSIS — S2220XA Unspecified fracture of sternum, initial encounter for closed fracture: Secondary | ICD-10-CM | POA: Diagnosis not present

## 2015-11-14 DIAGNOSIS — N189 Chronic kidney disease, unspecified: Secondary | ICD-10-CM | POA: Diagnosis not present

## 2015-11-14 DIAGNOSIS — R079 Chest pain, unspecified: Secondary | ICD-10-CM | POA: Diagnosis not present

## 2015-11-14 DIAGNOSIS — Z794 Long term (current) use of insulin: Secondary | ICD-10-CM | POA: Diagnosis not present

## 2015-11-14 DIAGNOSIS — Z86718 Personal history of other venous thrombosis and embolism: Secondary | ICD-10-CM | POA: Insufficient documentation

## 2015-11-14 DIAGNOSIS — E1122 Type 2 diabetes mellitus with diabetic chronic kidney disease: Secondary | ICD-10-CM | POA: Insufficient documentation

## 2015-11-14 DIAGNOSIS — G8929 Other chronic pain: Secondary | ICD-10-CM | POA: Diagnosis not present

## 2015-11-14 DIAGNOSIS — S2239XA Fracture of one rib, unspecified side, initial encounter for closed fracture: Secondary | ICD-10-CM | POA: Diagnosis not present

## 2015-11-14 DIAGNOSIS — S2231XA Fracture of one rib, right side, initial encounter for closed fracture: Secondary | ICD-10-CM | POA: Diagnosis not present

## 2015-11-14 DIAGNOSIS — M6281 Muscle weakness (generalized): Secondary | ICD-10-CM

## 2015-11-14 DIAGNOSIS — E785 Hyperlipidemia, unspecified: Secondary | ICD-10-CM | POA: Insufficient documentation

## 2015-11-14 DIAGNOSIS — R52 Pain, unspecified: Secondary | ICD-10-CM

## 2015-11-14 DIAGNOSIS — W19XXXA Unspecified fall, initial encounter: Secondary | ICD-10-CM | POA: Diagnosis not present

## 2015-11-14 DIAGNOSIS — E119 Type 2 diabetes mellitus without complications: Secondary | ICD-10-CM | POA: Diagnosis not present

## 2015-11-14 DIAGNOSIS — Z7952 Long term (current) use of systemic steroids: Secondary | ICD-10-CM | POA: Insufficient documentation

## 2015-11-14 DIAGNOSIS — R0789 Other chest pain: Secondary | ICD-10-CM | POA: Diagnosis not present

## 2015-11-14 DIAGNOSIS — Z96642 Presence of left artificial hip joint: Secondary | ICD-10-CM | POA: Insufficient documentation

## 2015-11-14 DIAGNOSIS — E7800 Pure hypercholesterolemia, unspecified: Secondary | ICD-10-CM | POA: Insufficient documentation

## 2015-11-14 DIAGNOSIS — F329 Major depressive disorder, single episode, unspecified: Secondary | ICD-10-CM | POA: Insufficient documentation

## 2015-11-14 DIAGNOSIS — I129 Hypertensive chronic kidney disease with stage 1 through stage 4 chronic kidney disease, or unspecified chronic kidney disease: Secondary | ICD-10-CM | POA: Diagnosis not present

## 2015-11-14 DIAGNOSIS — I10 Essential (primary) hypertension: Secondary | ICD-10-CM | POA: Diagnosis not present

## 2015-11-14 DIAGNOSIS — M19011 Primary osteoarthritis, right shoulder: Secondary | ICD-10-CM | POA: Diagnosis not present

## 2015-11-14 LAB — CBC WITH DIFFERENTIAL/PLATELET
BASOS ABS: 0.1 10*3/uL (ref 0–0.1)
Basophils Relative: 1 %
EOS PCT: 2 %
Eosinophils Absolute: 0.1 10*3/uL (ref 0–0.7)
HCT: 37.7 % (ref 35.0–47.0)
Hemoglobin: 12 g/dL (ref 12.0–16.0)
LYMPHS ABS: 1 10*3/uL (ref 1.0–3.6)
LYMPHS PCT: 15 %
MCH: 25.4 pg — ABNORMAL LOW (ref 26.0–34.0)
MCHC: 31.7 g/dL — AB (ref 32.0–36.0)
MCV: 80 fL (ref 80.0–100.0)
MONO ABS: 0.9 10*3/uL (ref 0.2–0.9)
Monocytes Relative: 14 %
Neutro Abs: 4.6 10*3/uL (ref 1.4–6.5)
Neutrophils Relative %: 68 %
Platelets: 444 10*3/uL — ABNORMAL HIGH (ref 150–440)
RBC: 4.72 MIL/uL (ref 3.80–5.20)
RDW: 18.3 % — ABNORMAL HIGH (ref 11.5–14.5)
WBC: 6.6 10*3/uL (ref 3.6–11.0)

## 2015-11-14 LAB — BASIC METABOLIC PANEL
Anion gap: 11 (ref 5–15)
BUN: 18 mg/dL (ref 6–20)
CO2: 25 mmol/L (ref 22–32)
CREATININE: 0.99 mg/dL (ref 0.44–1.00)
Calcium: 9.2 mg/dL (ref 8.9–10.3)
Chloride: 100 mmol/L — ABNORMAL LOW (ref 101–111)
GFR calc Af Amer: >60 mL/min (ref >60)
GFR, EST NON AFRICAN AMERICAN: 55 mL/min — AB (ref >60)
GLUCOSE: 187 mg/dL — AB (ref 65–99)
POTASSIUM: 4.1 mmol/L (ref 3.5–5.1)
Sodium: 136 mmol/L (ref 135–145)

## 2015-11-14 LAB — GLUCOSE, CAPILLARY
Glucose-Capillary: 140 mg/dL — ABNORMAL HIGH (ref 65–99)
Glucose-Capillary: 154 mg/dL — ABNORMAL HIGH (ref 65–99)
Glucose-Capillary: 163 mg/dL — ABNORMAL HIGH (ref 65–99)

## 2015-11-14 LAB — TROPONIN I

## 2015-11-14 MED ORDER — HYDROMORPHONE HCL 1 MG/ML IJ SOLN
0.5000 mg | Freq: Once | INTRAMUSCULAR | Status: AC
  Administered 2015-11-14: 0.5 mg via INTRAVENOUS
  Filled 2015-11-14: qty 1

## 2015-11-14 MED ORDER — FENTANYL 25 MCG/HR TD PT72
25.0000 ug | MEDICATED_PATCH | TRANSDERMAL | Status: DC
  Administered 2015-11-14: 25 ug via TRANSDERMAL
  Filled 2015-11-14: qty 1

## 2015-11-14 MED ORDER — DULOXETINE HCL 60 MG PO CPEP
60.0000 mg | ORAL_CAPSULE | Freq: Every day | ORAL | Status: DC
  Administered 2015-11-14 – 2015-11-16 (×3): 60 mg via ORAL
  Filled 2015-11-14 (×3): qty 1

## 2015-11-14 MED ORDER — HYDROMORPHONE HCL 1 MG/ML IJ SOLN
0.5000 mg | Freq: Once | INTRAMUSCULAR | Status: AC
  Administered 2015-11-14: 0.5 mg via INTRAVENOUS

## 2015-11-14 MED ORDER — HYDROCORTISONE 10 MG PO TABS: 10.0000 mg | ORAL_TABLET | Freq: Two times a day (BID) | ORAL | Status: DC

## 2015-11-14 MED ORDER — ONDANSETRON HCL 4 MG/2ML IJ SOLN
4.0000 mg | Freq: Once | INTRAMUSCULAR | Status: AC
  Administered 2015-11-14: 4 mg via INTRAVENOUS
  Filled 2015-11-14: qty 2

## 2015-11-14 MED ORDER — AMLODIPINE BESYLATE 10 MG PO TABS
10.0000 mg | ORAL_TABLET | Freq: Every day | ORAL | Status: DC
  Administered 2015-11-14 – 2015-11-16 (×3): 10 mg via ORAL
  Filled 2015-11-14 (×3): qty 1

## 2015-11-14 MED ORDER — ORAL CARE MOUTH RINSE
15.0000 mL | Freq: Two times a day (BID) | OROMUCOSAL | Status: DC
  Administered 2015-11-15 – 2015-11-16 (×2): 15 mL via OROMUCOSAL

## 2015-11-14 MED ORDER — PRAVASTATIN SODIUM 10 MG PO TABS
10.0000 mg | ORAL_TABLET | Freq: Every day | ORAL | Status: DC
  Administered 2015-11-14 – 2015-11-15 (×2): 10 mg via ORAL
  Filled 2015-11-14 (×2): qty 1

## 2015-11-14 MED ORDER — ONDANSETRON HCL 4 MG PO TABS: 4.0000 mg | ORAL_TABLET | Freq: Four times a day (QID) | ORAL | Status: DC | PRN

## 2015-11-14 MED ORDER — NYSTATIN 100000 UNIT/GM EX POWD
Freq: Two times a day (BID) | CUTANEOUS | Status: DC
  Filled 2015-11-14: qty 15

## 2015-11-14 MED ORDER — NYSTATIN 100000 UNIT/ML MT SUSP: 5.0000 mL | Freq: Four times a day (QID) | OROMUCOSAL | Status: DC

## 2015-11-14 MED ORDER — INSULIN ASPART 100 UNIT/ML ~~LOC~~ SOLN
0.0000 [IU] | Freq: Every day | SUBCUTANEOUS | Status: DC
  Filled 2015-11-14: qty 2

## 2015-11-14 MED ORDER — ENOXAPARIN SODIUM 40 MG/0.4ML ~~LOC~~ SOLN
40.0000 mg | SUBCUTANEOUS | Status: DC
  Administered 2015-11-14 – 2015-11-15 (×2): 40 mg via SUBCUTANEOUS
  Filled 2015-11-14 (×2): qty 0.4

## 2015-11-14 MED ORDER — HYDROCORTISONE 10 MG PO TABS
20.0000 mg | ORAL_TABLET | Freq: Every day | ORAL | Status: DC
  Administered 2015-11-15 – 2015-11-16 (×2): 20 mg via ORAL
  Filled 2015-11-14 (×2): qty 2

## 2015-11-14 MED ORDER — HYDROCORTISONE 10 MG PO TABS
10.0000 mg | ORAL_TABLET | Freq: Every day | ORAL | Status: DC
  Administered 2015-11-14 – 2015-11-15 (×2): 10 mg via ORAL
  Filled 2015-11-14 (×2): qty 1

## 2015-11-14 MED ORDER — POLYETHYLENE GLYCOL 3350 17 G PO PACK: 17.0000 g | PACK | Freq: Every day | ORAL | Status: DC | PRN

## 2015-11-14 MED ORDER — SODIUM CHLORIDE 0.9% FLUSH
3.0000 mL | Freq: Two times a day (BID) | INTRAVENOUS | Status: DC
  Administered 2015-11-14 – 2015-11-16 (×4): 3 mL via INTRAVENOUS

## 2015-11-14 MED ORDER — SENNA 8.6 MG PO TABS
1.0000 | ORAL_TABLET | Freq: Two times a day (BID) | ORAL | Status: DC
  Administered 2015-11-14 – 2015-11-16 (×4): 8.6 mg via ORAL
  Filled 2015-11-14 (×4): qty 1

## 2015-11-14 MED ORDER — INSULIN ASPART 100 UNIT/ML ~~LOC~~ SOLN: 8.0000 [IU] | Freq: Every day | SUBCUTANEOUS | Status: DC

## 2015-11-14 MED ORDER — INSULIN ASPART 100 UNIT/ML ~~LOC~~ SOLN
0.0000 [IU] | Freq: Three times a day (TID) | SUBCUTANEOUS | Status: DC
  Administered 2015-11-15: 1 [IU] via SUBCUTANEOUS
  Administered 2015-11-15 (×2): 2 [IU] via SUBCUTANEOUS
  Administered 2015-11-16: 13:00:00 3 [IU] via SUBCUTANEOUS
  Administered 2015-11-16: 08:00:00 1 [IU] via SUBCUTANEOUS
  Filled 2015-11-14: qty 3
  Filled 2015-11-14: qty 1
  Filled 2015-11-14 (×2): qty 2
  Filled 2015-11-14: qty 1

## 2015-11-14 MED ORDER — INSULIN ASPART 100 UNIT/ML ~~LOC~~ SOLN: 12.0000 [IU] | Freq: Every day | SUBCUTANEOUS | Status: DC

## 2015-11-14 MED ORDER — SODIUM CHLORIDE 0.9 % IV BOLUS (SEPSIS)
500.0000 mL | Freq: Once | INTRAVENOUS | Status: AC
Start: 2015-11-14 — End: 2015-11-14
  Administered 2015-11-14: 500 mL via INTRAVENOUS

## 2015-11-14 MED ORDER — INSULIN GLARGINE 100 UNITS/ML SOLOSTAR PEN: 10.0000 [IU] | PEN_INJECTOR | Freq: Every day | SUBCUTANEOUS | Status: DC

## 2015-11-14 MED ORDER — INSULIN GLARGINE 100 UNIT/ML ~~LOC~~ SOLN
10.0000 [IU] | Freq: Every day | SUBCUTANEOUS | Status: DC
  Administered 2015-11-14 – 2015-11-15 (×2): 10 [IU] via SUBCUTANEOUS
  Filled 2015-11-14 (×3): qty 0.1

## 2015-11-14 MED ORDER — ONDANSETRON HCL 4 MG/2ML IJ SOLN: 4.0000 mg | Freq: Four times a day (QID) | INTRAMUSCULAR | Status: DC | PRN

## 2015-11-14 MED ORDER — INSULIN ASPART 100 UNIT/ML ~~LOC~~ SOLN: 10.0000 [IU] | Freq: Every day | SUBCUTANEOUS | Status: DC

## 2015-11-14 MED ORDER — METOPROLOL TARTRATE 50 MG PO TABS
50.0000 mg | ORAL_TABLET | Freq: Two times a day (BID) | ORAL | Status: DC
  Administered 2015-11-14 – 2015-11-16 (×4): 50 mg via ORAL
  Filled 2015-11-14 (×4): qty 1

## 2015-11-14 MED ORDER — ALBUTEROL SULFATE (2.5 MG/3ML) 0.083% IN NEBU: 2.5000 mg | INHALATION_SOLUTION | RESPIRATORY_TRACT | Status: DC | PRN

## 2015-11-14 MED ORDER — HYDROMORPHONE HCL 2 MG PO TABS
2.0000 mg | ORAL_TABLET | ORAL | Status: DC | PRN
  Administered 2015-11-14 – 2015-11-15 (×4): 2 mg via ORAL
  Filled 2015-11-14 (×4): qty 1

## 2015-11-14 MED ORDER — PANTOPRAZOLE SODIUM 40 MG PO TBEC
40.0000 mg | DELAYED_RELEASE_TABLET | Freq: Every day | ORAL | Status: DC
  Administered 2015-11-15 – 2015-11-16 (×2): 40 mg via ORAL
  Filled 2015-11-14 (×2): qty 1

## 2015-11-14 MED ORDER — HYDROMORPHONE HCL 1 MG/ML IJ SOLN
INTRAMUSCULAR | Status: AC
  Administered 2015-11-14: 0.5 mg via INTRAVENOUS
  Filled 2015-11-14: qty 1

## 2015-11-14 MED ORDER — DULAGLUTIDE 0.75 MG/0.5ML ~~LOC~~ SOAJ: 0.5000 mL | SUBCUTANEOUS | Status: DC

## 2015-11-14 MED ORDER — METAXALONE 800 MG PO TABS
400.0000 mg | ORAL_TABLET | Freq: Three times a day (TID) | ORAL | Status: DC
  Administered 2015-11-14 – 2015-11-16 (×5): 400 mg via ORAL
  Filled 2015-11-14 (×6): qty 1

## 2015-11-14 NOTE — ED Provider Notes (Signed)
[facility] Emergency Department Provider Note ____________________________________________   I have reviewed the triage vital signs and the triage nursing note.  HISTORY  Chief Complaint Chest Pain   Historian Patient  HPI Jordan Brewer is a 75 y.o. female was recently admitted in the hospital for sternal and rib fracture after a fall, and was discharged home with fentanyl patch as well as by mouth Dilaudid, and states that over the weekend she has had persistent severe pain especially at her central sternum where the fracture is. She states she was crying all night due to pain and was waiting for the home health nurse this morning and seeing how much pain she was in was told to come to emergency room. She does not normally wear oxygen, and due to hypoventilation, her O2 sat was in the mid 80s today. Here. No nausea or vomiting. No abdominal pain.    Past Medical History:  Diagnosis Date  . Addison's disease ([place])   . Ankle fracture, left 02/2012  . Arthritis    "all over my body" (04/10/2012)  . Bipolar depression ([place])   . Cancer ([place])   . Chronic back pain    "since my hip OR in 09/2010"  . Depression   . DVT, lower extremity ([place]) 09/2010   "left; after hip replacement" (04/10/2012)  . Fall at home 02/2012   'foot got caught on heating pad cord; broke left ankle" (04/10/2012)  . GERD (gastroesophageal reflux disease)   . History of blood transfusion   . Hypercholesteremia   . Hypertension   . Syncope and collapse 04/10/2012   "w/loss of consciousness" (04/10/2012)  . Type II diabetes mellitus [facility])     Patient Active Problem List   Diagnosis Date Noted  . Syncope 11/07/2015  . Colitis 10/30/2015  . Sepsis ([place]) 10/30/2015  . Type II or unspecified type diabetes mellitus without mention of complication, not stated as uncontrolled 04/10/2012  . Unspecified essential hypertension 04/10/2012  . Bipolar I disorder, most recent episode (or current)  unspecified 04/10/2012  . Addison's disease ([place]) 04/10/2012  . Chronic kidney disease 04/10/2012  . SAH (subarachnoid hemorrhage) ([place]) 04/10/2012    Past Surgical History:  Procedure Laterality Date  . APPENDECTOMY  ~ 1949  . AUGMENTATION MAMMAPLASTY Bilateral 1970  . CATARACT EXTRACTION W/ INTRAOCULAR LENS  IMPLANT, BILATERAL Bilateral ~ 2010  . COLOSTOMY  ~ 2010  . COLOSTOMY REVERSAL  ~ 2010   "wore a bag for ~ 6 months" (04/10/2012)  . DILATION AND CURETTAGE OF UTERUS  ~ 1964   "after a miscarriage" (04/10/2012)  . JOINT REPLACEMENT    . NASAL HEMORRHAGE CONTROL  ~ 2009   "had to have blood transfusion" (04/10/2012)  . TONSILLECTOMY  ~ 1949  . TOTAL HIP ARTHROPLASTY Left 09/2010  . VAGINAL HYSTERECTOMY  1970's?    Prior to Admission medications   Medication Sig Start Date End Date Taking? Authorizing Provider  amLODipine (NORVASC) 10 MG tablet Take 10 mg by mouth daily.   Yes Historical Provider, MD  DULoxetine (CYMBALTA) 60 MG capsule Take 60 mg by mouth daily.   Yes Historical Provider, MD  fentaNYL (DURAGESIC - DOSED MCG/HR) 12 MCG/HR Place 1 patch (12.5 mcg total) onto the skin every 3 (three) days. 11/13/15  Yes Gladstone Lighter, MD  fludrocortisone (FLORINEF) 0.1 MG tablet Take 0.1 mg by mouth daily.   Yes Historical Provider, MD  hydrocortisone (CORTEF) 10 MG tablet Take 10-20 mg by mouth 2 (two) times daily.  20 mg every morning and 10 mg at bedtime   Yes Historical Provider, MD  insulin aspart (NOVOLOG) 100 UNIT/ML injection Inject into the skin 3 (three) times daily before meals. Per sliding scale 8 units am,10 units noon, 12 units pm   Yes Historical Provider, MD  insulin glargine (LANTUS) 100 unit/mL SOPN Inject 10 Units into the skin at bedtime.   Yes Historical Provider, MD  lovastatin (MEVACOR) 40 MG tablet Take 40 mg by mouth at bedtime. 08/20/14  Yes Historical Provider, MD  omeprazole (PRILOSEC) 40 MG capsule Take 40 mg by mouth 2 (two) times daily.   Yes Historical  Provider, MD  TRULICITY [id number] SOPN Inject 0.5 mLs into the skin every Wednesday.   Yes Historical Provider, MD  docusate sodium (COLACE) 100 MG capsule Take 1 capsule (100 mg total) by mouth 2 (two) times daily. Patient taking differently: Take 100 mg by mouth 2 (two) times daily as needed for moderate constipation.  11/16/14   Gladstone Lighter, MD  HYDROmorphone (DILAUDID) 2 MG tablet Take 1 tablet (2 mg total) by mouth every 6 (six) hours as needed for severe pain. Patient not taking: Reported on 11/14/2015 11/11/15   Gladstone Lighter, MD  metaxalone (SKELAXIN) 400 MG tablet Take 1 tablet (400 mg total) by mouth 3 (three) times daily. X 10 days Patient not taking: Reported on 11/14/2015 11/11/15   Gladstone Lighter, MD  metoprolol (LOPRESSOR) 50 MG tablet Take 1 tablet (50 mg total) by mouth 2 (two) times daily. Patient not taking: Reported on 11/14/2015 11/11/15   Gladstone Lighter, MD  nystatin (MYCOSTATIN) 100000 UNIT/ML suspension Take 5 mLs (500,000 Units total) by mouth 4 (four) times daily. Patient not taking: Reported on 11/14/2015 11/11/15   Gladstone Lighter, MD  nystatin (MYCOSTATIN/NYSTOP) powder Apply topically 2 (two) times daily. Patient not taking: Reported on 11/14/2015 11/11/15   Gladstone Lighter, MD  polyethylene glycol (MIRALAX / GLYCOLAX) packet Take 17 g by mouth daily as needed for mild constipation. 11/11/15   Gladstone Lighter, MD    Allergies  Allergen Reactions  . Codeine Nausea And Vomiting  . 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 Promethazine Nausea And Vomiting  . Lidoderm [Lidocaine] Rash    Family History  Problem Relation Age of Onset  . Hypertension Mother   . Diabetes Mellitus II Mother   . Heart attack Mother   . Lung cancer Father     Social History Social History  Substance Use Topics  . Smoking status: Never Smoker  . Smokeless tobacco: Never Used  . Alcohol use No    Review of  Systems  Constitutional: Negative for fever. Eyes: Negative for visual changes. ENT: Negative for sore throat. Cardiovascular:Positive for central chest pain over the sternum.Marland Kitchen Respiratory: Negative for shortness of breath.  Positive for pain with cough. Gastrointestinal: Negative for abdominal pain, vomiting and diarrhea. Genitourinary: Negative for dysuria. Musculoskeletal: Negative for back pain. Skin: Negative for rash. Neurological: Negative for headache. 10 point Review of Systems otherwise negative ____________________________________________   PHYSICAL EXAM:  VITAL SIGNS: ED Triage Vitals  Enc Vitals Group     BP 11/14/15 1126 (!) 158/87     Pulse Rate 11/14/15 1126 86     Resp 11/14/15 1126 (!) 23     Temp 11/14/15 1126 98.4 F (36.9 C)     Temp Source 11/14/15 1126 Oral     SpO2 11/14/15 1126 (!) 89 %     Weight  11/14/15 1127 158 lb (71.7 kg)     Height 11/14/15 1127 5\' 4"  (1.626 m)     Head Circumference --      Peak Flow --      Pain Score 11/14/15 1127 10     Pain Loc --      Pain Edu? --      Excl. in [place]? --      Constitutional: Alert and oriented. Well appearing and in no distress. HEENT   Head: Normocephalic and atraumatic.      Eyes: Conjunctivae are normal. PERRL. Normal extraocular movements.      Ears:         Nose: No congestion/rhinnorhea.   Mouth/Throat: Mucous membranes are moist.   Neck: No stridor. Cardiovascular/Chest: Normal rate, regular rhythm.  No murmurs, rubs, or gallops. Respiratory: Normal respiratory effort without tachypnea nor retractions. Breath sounds are clear and equal bilaterally. No wheezes/rales/rhonchi.  Somewhat limited sounds at the bases, not very deep breath due to pain. Gastrointestinal: Soft. No distention, no guarding, no rebound. Nontender.    Genitourinary/rectal:Deferred Musculoskeletal: Nontender with normal range of motion in all extremities. No joint effusions.  No lower extremity tenderness.  No  edema. Neurologic:  Normal speech and language. No gross or focal neurologic deficits are appreciated. Skin:  Skin is warm, dry and intact. No rash noted. Psychiatric: Mood and affect are normal. Speech and behavior are normal. Patient exhibits appropriate insight and judgment.   ____________________________________________  LABS (pertinent positives/negatives)  Labs Reviewed  BASIC METABOLIC PANEL - Abnormal; Notable for the following:       Result Value   Chloride 100 (*)    Glucose, Bld 187 (*)    GFR calc non Af Amer 55 (*)    All other components within normal limits  CBC WITH DIFFERENTIAL/PLATELET - Abnormal; Notable for the following:    MCH 25.4 (*)    MCHC 31.7 (*)    RDW 18.3 (*)    Platelets 444 (*)    All other components within normal limits  TROPONIN I    ____________________________________________    EKG I, Lisa Roca, MD, the attending physician have personally viewed and interpreted all ECGs.  84 bpm. Normal sinus rhythm. Narrow QRS. Normal axis. Normal ST and T-wave. ____________________________________________  RADIOLOGY All Xrays were viewed by me. Imaging interpreted by Radiologist.  Chest two-view:  FINDINGS: Normal sized heart. Clear lungs. And upper sternal fracture is better visualized today. No significant displacement or angulation. The right anterior sixth rib fracture is not visible on today's radiographs. Diffuse osteopenia is noted as well as bilateral shoulder degenerative changes and bilateral breast implants with capsular calcifications. Aortic calcifications are also noted. Thoracic spine degenerative changes.  IMPRESSION: 1. No acute abnormality. 2. Better demonstrated upper sternal fracture. 3. Aortic atherosclerosis.  Humerus right:  IMPRESSION: 1. Moderate right glenohumeral joint degenerative changes. 2. Distal rotator cuff calcific tendinitis.  __________________________________________  PROCEDURES  Procedure(s)  performed: None  Critical Care performed: None  ____________________________________________   ED COURSE / ASSESSMENT AND PLAN  Pertinent labs & imaging results that were available during my care of the patient were reviewed by me and considered in my medical decision making (see chart for details).   Ms. Dobbins is here for uncontrolled pain due to known recent sternal and rib fracture. She was given IV Dilaudid here for symptomatic relief.  I suspect hypoxia due to hypoventilation.   Stable on 2 L nasal cannula. She also some crepitus and discomfort at  her right shoulder which was not completely imaged and sided abdominal QRS, and there is no evidence of acute fracture of the right humerus or shoulder.  Some improvement with pain medication, but pain returned. Patient will need hospitalization for pain control.    CONSULTATIONS:   Hospitalist for admission Patient / Family / Caregiver informed of clinical course, medical decision-making process, and agree with plan.   ___________________________________________   FINAL CLINICAL IMPRESSION(S) / ED DIAGNOSES   Final diagnoses:  Closed fracture of body of sternum, initial encounter  Uncontrolled pain              Note: This dictation was prepared with Dragon dictation. Any transcriptional errors that result from this process are unintentional    Lisa Roca, MD 11/14/15 1513

## 2015-11-14 NOTE — ED Notes (Signed)
Patient transported to X-ray 

## 2015-11-14 NOTE — H&P (Signed)
[facility] [place] NAME: Jordan Brewer    MR#:  [id number]  DATE OF BIRTH:  10-May-1940  DATE OF ADMISSION:  11/14/2015  PRIMARY CARE PHYSICIAN: Tracie Harrier, MD   REQUESTING/REFERRING PHYSICIAN: Dr. Reita Cliche  CHIEF COMPLAINT:  Chest wall pain  HISTORY OF PRESENT ILLNESS:  Jordan Brewer  is a 75 y.o. female with a known history of BPD, recent fall with sternal fracture and sixth right rib fracture was admitted to the hospital on October 30 and was discharged with fentanyl patch and Dilaudid pills. Patient was reporting she had persistent severe pain in the sternum and fracture area was in tears whole night. Has home health nurse has recommended the patient to come to the ED and she was in terrible pain. Patient was hypoxic with pulse ox at 80% secondary to hypoventilation from the poor respiratory effort.  PAST MEDICAL HISTORY:   Past Medical History:  Diagnosis Date  . Addison's disease ([place])   . Ankle fracture, left 02/2012  . Arthritis    "all over my body" (04/10/2012)  . Bipolar depression ([place])   . Cancer ([place])   . Chronic back pain    "since my hip OR in 09/2010"  . Depression   . DVT, lower extremity ([place]) 09/2010   "left; after hip replacement" (04/10/2012)  . Fall at home 02/2012   'foot got caught on heating pad cord; broke left ankle" (04/10/2012)  . GERD (gastroesophageal reflux disease)   . History of blood transfusion   . Hypercholesteremia   . Hypertension   . Syncope and collapse 04/10/2012   "w/loss of consciousness" (04/10/2012)  . Type II diabetes mellitus ([place])     PAST SURGICAL HISTOIRY:   Past Surgical History:  Procedure Laterality Date  . APPENDECTOMY  ~ 1949  . AUGMENTATION MAMMAPLASTY Bilateral 1970  . CATARACT EXTRACTION W/ INTRAOCULAR LENS  IMPLANT, BILATERAL Bilateral ~ 2010  . COLOSTOMY  ~ 2010  . COLOSTOMY REVERSAL  ~ 2010   "wore a bag for ~ 6 months" (04/10/2012)  . DILATION AND CURETTAGE OF  UTERUS  ~ 1964   "after a miscarriage" (04/10/2012)  . JOINT REPLACEMENT    . NASAL HEMORRHAGE CONTROL  ~ 2009   "had to have blood transfusion" (04/10/2012)  . TONSILLECTOMY  ~ 1949  . TOTAL HIP ARTHROPLASTY Left 09/2010  . VAGINAL HYSTERECTOMY  1970's?    SOCIAL HISTORY:   Social History  Substance Use Topics  . Smoking status: Never Smoker  . Smokeless tobacco: Never Used  . Alcohol use No    FAMILY HISTORY:   Family History  Problem Relation Age of Onset  . Hypertension Mother   . Diabetes Mellitus II Mother   . Heart attack Mother   . Lung cancer Father     DRUG ALLERGIES:   Allergies  Allergen Reactions  . Codeine Nausea And Vomiting  . 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 Promethazine Nausea And Vomiting  . Lidoderm [Lidocaine] Rash    REVIEW OF SYSTEMS:  CONSTITUTIONAL: No fever, fatigue or weakness.  EYES: No blurred or double vision.  EARS, NOSE, AND THROAT: No tinnitus or ear pain.  RESPIRATORY: No cough, shortness of breath, wheezing or hemoptysis.  CARDIOVASCULAR: Reports chest wall pain, no orthopnea, edema.  GASTROINTESTINAL: No nausea, vomiting, diarrhea or abdominal pain.  GENITOURINARY: No dysuria, hematuria.  ENDOCRINE: No polyuria, nocturia,  HEMATOLOGY: No anemia, easy bruising or  bleeding SKIN: No rash or lesion. MUSCULOSKELETAL: No joint pain or arthritis.   NEUROLOGIC: No tingling, numbness, weakness.  PSYCHIATRY: No anxiety or depression.   MEDICATIONS AT HOME:   Prior to Admission medications   Medication Sig Start Date End Date Taking? Authorizing Provider  amLODipine (NORVASC) 10 MG tablet Take 10 mg by mouth daily.   Yes Historical Provider, MD  DULoxetine (CYMBALTA) 60 MG capsule Take 60 mg by mouth daily.   Yes Historical Provider, MD  fentaNYL (DURAGESIC - DOSED MCG/HR) 12 MCG/HR Place 1 patch (12.5 mcg total) onto the skin every 3 (three) days. 11/13/15  Yes Gladstone Lighter, MD   fludrocortisone (FLORINEF) 0.1 MG tablet Take 0.1 mg by mouth daily.   Yes Historical Provider, MD  hydrocortisone (CORTEF) 10 MG tablet Take 10-20 mg by mouth 2 (two) times daily. 20 mg every morning and 10 mg at bedtime   Yes Historical Provider, MD  insulin aspart (NOVOLOG) 100 UNIT/ML injection Inject into the skin 3 (three) times daily before meals. Per sliding scale 8 units am,10 units noon, 12 units pm   Yes Historical Provider, MD  insulin glargine (LANTUS) 100 unit/mL SOPN Inject 10 Units into the skin at bedtime.   Yes Historical Provider, MD  lovastatin (MEVACOR) 40 MG tablet Take 40 mg by mouth at bedtime. 08/20/14  Yes Historical Provider, MD  omeprazole (PRILOSEC) 40 MG capsule Take 40 mg by mouth 2 (two) times daily.   Yes Historical Provider, MD  TRULICITY [id number] SOPN Inject 0.5 mLs into the skin every Wednesday.   Yes Historical Provider, MD  docusate sodium (COLACE) 100 MG capsule Take 1 capsule (100 mg total) by mouth 2 (two) times daily. Patient taking differently: Take 100 mg by mouth 2 (two) times daily as needed for moderate constipation.  11/16/14   Gladstone Lighter, MD  HYDROmorphone (DILAUDID) 2 MG tablet Take 1 tablet (2 mg total) by mouth every 6 (six) hours as needed for severe pain. Patient not taking: Reported on 11/14/2015 11/11/15   Gladstone Lighter, MD  metaxalone (SKELAXIN) 400 MG tablet Take 1 tablet (400 mg total) by mouth 3 (three) times daily. X 10 days Patient not taking: Reported on 11/14/2015 11/11/15   Gladstone Lighter, MD  metoprolol (LOPRESSOR) 50 MG tablet Take 1 tablet (50 mg total) by mouth 2 (two) times daily. Patient not taking: Reported on 11/14/2015 11/11/15   Gladstone Lighter, MD  nystatin (MYCOSTATIN) 100000 UNIT/ML suspension Take 5 mLs (500,000 Units total) by mouth 4 (four) times daily. Patient not taking: Reported on 11/14/2015 11/11/15   Gladstone Lighter, MD  nystatin (MYCOSTATIN/NYSTOP) powder Apply topically 2 (two) times  daily. Patient not taking: Reported on 11/14/2015 11/11/15   Gladstone Lighter, MD  polyethylene glycol (MIRALAX / GLYCOLAX) packet Take 17 g by mouth daily as needed for mild constipation. 11/11/15   Gladstone Lighter, MD      VITAL SIGNS:  Blood pressure 139/72, pulse 67, temperature 98.4 F (36.9 C), temperature source Oral, resp. rate 15, height 5\' 4"  (1.626 m), weight 71.7 kg (158 lb), SpO2 95 %.  PHYSICAL EXAMINATION:  GENERAL:  75 y.o.-year-old patient lying in the bed with no acute distress.  EYES: Pupils equal, round, reactive to light and accommodation. No scleral icterus. Extraocular muscles intact.  HEENT: Head atraumatic, normocephalic. Oropharynx and nasopharynx clear.  NECK:  Supple, no jugular venous distention. No thyroid enlargement, no tenderness.  LUNGS: Moderate breath sounds bilaterally, no wheezing, rales,rhonchi or crepitation. No use of accessory muscles of  respiration.  CARDIOVASCULAR: S1, S2 normal. No murmurs, rubs, or gallops. Anterior sternal wall tenderness and right-sided chest wall tenderness in the sixth rib area reproducible on palpation, ABDOMEN: Soft, nontender, nondistended. Bowel sounds present. No organomegaly or mass.  EXTREMITIES: No pedal edema, cyanosis, or clubbing.  NEUROLOGIC: Cranial nerves II through XII are intact. Muscle strength 5/5 in all extremities. Sensation intact. Gait not checked.  PSYCHIATRIC: The patient is alert and oriented x 3.  SKIN: No obvious rash, lesion, or ulcer.   LABORATORY PANEL:   CBC  Recent Labs Lab 11/14/15 1237  WBC 6.6  HGB 12.0  HCT 37.7  PLT 444*   ------------------------------------------------------------------------------------------------------------------  Chemistries   Recent Labs Lab 11/08/15 0329  11/14/15 1237  NA 138  < > 136  K 3.5  < > 4.1  CL 108  < > 100*  CO2 25  < > 25  GLUCOSE 171*  < > 187*  BUN 6  < > 18  CREATININE 0.81  < > 0.99  CALCIUM 7.7*  < > 9.2  MG 2.2  --   --    < > = values in this interval not displayed. ------------------------------------------------------------------------------------------------------------------  Cardiac Enzymes  Recent Labs Lab 11/14/15 1237  TROPONINI <0.03   ------------------------------------------------------------------------------------------------------------------  RADIOLOGY:  Dg Chest 2 View  Result Date: 11/14/2015 CLINICAL DATA:  Right anterior sixth rib fracture and possible sternal fracture on a chest CTA dated 11/07/2015. The patient is currently complaining of severe sternal and rib pain. EXAM: CHEST  2 VIEW COMPARISON:  Chest CTA dated 11/07/2015 and chest radiographs dated 11/07/2015. FINDINGS: Normal sized heart. Clear lungs. And upper sternal fracture is better visualized today. No significant displacement or angulation. The right anterior sixth rib fracture is not visible on today's radiographs. Diffuse osteopenia is noted as well as bilateral shoulder degenerative changes and bilateral breast implants with capsular calcifications. Aortic calcifications are also noted. Thoracic spine degenerative changes. IMPRESSION: 1. No acute abnormality. 2. Better demonstrated upper sternal fracture. 3. Aortic atherosclerosis. Electronically Signed   By: Claudie Revering M.D.   On: 11/14/2015 13:11   Dg Humerus Right  Result Date: 11/14/2015 CLINICAL DATA:  Right upper arm pain. EXAM: RIGHT HUMERUS - 2+ VIEW COMPARISON:  None. FINDINGS: Moderate right glenohumeral joint degenerative changes. No fracture or dislocation seen. Distal rotator cuff calcification. IMPRESSION: 1. Moderate right glenohumeral joint degenerative changes. 2. Distal rotator cuff calcific tendinitis. Electronically Signed   By: Claudie Revering M.D.   On: 11/14/2015 13:12    EKG:   Orders placed or performed during the hospital encounter of 11/14/15  . EKG 12-Lead  . EKG 12-Lead    IMPRESSION AND PLAN:   Tannaz Eckard  is a 75 y.o. female with a  known history of BPD, recent fall with sternal fracture and sixth right rib fracture was admitted to the hospital on October 30 and was discharged with fentanyl patch and Dilaudid pills. Patient was reporting she had persistent severe pain in the sternum and fracture area was in tears whole night. Has home health nurse has recommended the patient to come to the ED  # Acute chest wall pain from recent sternal fracture and right sixth rib fracture Pain is not well controlled with current pain regimen, admitted to MedSurg unit Fentanyl patch dose increased from 12.5-25 g Dilaudid 2 mg by mouth as needed for breakthrough pain, frequency changed from every 6 hours to every 4 hours as needed Will consider pain management consult if no improvement Incentive  spirometry for hypoventilation Muscle relaxants PT consult is placed  #Essential hypertension-continue medications Norvasc, metoprolol. Continue Florinef and hydrocortisone  #History of Addison's disease continue Florinef and hydrocortisone  #Insulin requiring diabetes mellitus continue her home dose Lantus and aspart insulin  DVT prophylaxis-      All the records are reviewed and case discussed with ED provider. Management plans discussed with the patient, she is  in agreement.  CODE STATUS: FC, son is the HCPOA  TOTAL TIME TAKING CARE OF THIS PATIENT: 42 minutes.   Note: This dictation was prepared with Dragon dictation along with smaller phrase technology. Any transcriptional errors that result from this process are unintentional.  Nicholes Mango M.D on 11/14/2015 at 5:20 PM  Between 7am to 6pm - Pager - [phone]  After 6pm go to [email or web address] - password [email or web address] [place]  Office  [phone]  CC: Primary care physician; Tracie Harrier, MD

## 2015-11-14 NOTE — ED Triage Notes (Signed)
Per EMS, pt has hx of fall with sternum and rib frx that occurred last Monday. Pt was admitted Monday and d/c'd Friday. Since being at home pt has been experiencing severe pain to sternum and rib areas and is not able to be as active as normal. Pt reports no relief with fentanyl patch and dilaudid pills and an unknown muscle relaxer.

## 2015-11-15 DIAGNOSIS — E119 Type 2 diabetes mellitus without complications: Secondary | ICD-10-CM | POA: Diagnosis not present

## 2015-11-15 DIAGNOSIS — S2239XA Fracture of one rib, unspecified side, initial encounter for closed fracture: Secondary | ICD-10-CM | POA: Diagnosis not present

## 2015-11-15 DIAGNOSIS — R0789 Other chest pain: Secondary | ICD-10-CM | POA: Diagnosis not present

## 2015-11-15 DIAGNOSIS — I10 Essential (primary) hypertension: Secondary | ICD-10-CM | POA: Diagnosis not present

## 2015-11-15 LAB — GLUCOSE, CAPILLARY
GLUCOSE-CAPILLARY: 123 mg/dL — AB (ref 65–99)
GLUCOSE-CAPILLARY: 182 mg/dL — AB (ref 65–99)
Glucose-Capillary: 193 mg/dL — ABNORMAL HIGH (ref 65–99)
Glucose-Capillary: 176 mg/dL — ABNORMAL HIGH (ref 65–99)

## 2015-11-15 LAB — COMPREHENSIVE METABOLIC PANEL
ALBUMIN: 3.3 g/dL — AB (ref 3.5–5.0)
ALK PHOS: 68 U/L (ref 38–126)
ALT: 11 U/L — AB (ref 14–54)
AST: 17 U/L (ref 15–41)
Anion gap: 8 (ref 5–15)
BILIRUBIN TOTAL: 0.5 mg/dL (ref 0.3–1.2)
BUN: 16 mg/dL (ref 6–20)
CO2: 27 mmol/L (ref 22–32)
CREATININE: 0.89 mg/dL (ref 0.44–1.00)
Calcium: 9 mg/dL (ref 8.9–10.3)
Chloride: 103 mmol/L (ref 101–111)
GFR calc Af Amer: >60 mL/min (ref >60)
GLUCOSE: 143 mg/dL — AB (ref 65–99)
Potassium: 4.7 mmol/L (ref 3.5–5.1)
Sodium: 138 mmol/L (ref 135–145)
TOTAL PROTEIN: 6.7 g/dL (ref 6.5–8.1)

## 2015-11-15 LAB — CBC
HEMATOCRIT: 34.6 % — AB (ref 35.0–47.0)
HEMOGLOBIN: 11.1 g/dL — AB (ref 12.0–16.0)
MCH: 25.9 pg — ABNORMAL LOW (ref 26.0–34.0)
MCHC: 32.2 g/dL (ref 32.0–36.0)
MCV: 80.4 fL (ref 80.0–100.0)
Platelets: 411 10*3/uL (ref 150–440)
RBC: 4.3 MIL/uL (ref 3.80–5.20)
RDW: 18.5 % — ABNORMAL HIGH (ref 11.5–14.5)
WBC: 5.7 10*3/uL (ref 3.6–11.0)

## 2015-11-15 MED ORDER — FENTANYL 25 MCG/HR TD PT72: 37.5000 ug | MEDICATED_PATCH | TRANSDERMAL | Status: DC

## 2015-11-15 MED ORDER — FLUDROCORTISONE ACETATE 0.1 MG PO TABS
0.1000 mg | ORAL_TABLET | Freq: Every day | ORAL | Status: DC
  Administered 2015-11-15 – 2015-11-16 (×2): 0.1 mg via ORAL
  Filled 2015-11-15 (×2): qty 1

## 2015-11-15 MED ORDER — HYDROMORPHONE HCL 2 MG PO TABS
2.0000 mg | ORAL_TABLET | ORAL | Status: DC | PRN
  Administered 2015-11-15 – 2015-11-16 (×5): 2 mg via ORAL
  Filled 2015-11-15 (×5): qty 1

## 2015-11-15 MED ORDER — ACETAMINOPHEN 325 MG PO TABS: 650.0000 mg | ORAL_TABLET | Freq: Four times a day (QID) | ORAL | Status: DC | PRN

## 2015-11-15 NOTE — Care Management Obs Status (Signed)
[facility] NOTIFICATION   Patient Details  Name: Audery Marx MRN: [id number] Date of Birth: 11-06-1940   Medicare Observation Status Notification Given:  Yes    Shelbie Ammons, RN 11/15/2015, 1:40 PM

## 2015-11-15 NOTE — Care Management (Signed)
Admitted to [facility] with the diagnosis of pain management under observation status. Lives alone. Son is Edd Arbour [phone]). Primary care physician is Dr. Ginette Pitman. Followed by [facility] for Nursing, physical therapy, occupational therapy and aide. Home Health through [facility] in the past. Would like [facility], brochure given last admission. Rolling walker, cane, shower chair, shower bars, bedside commode in the home. Fell x 3 in October. (Fx sternum and rib). Fair appetite. Prescriptions are filled at [facility] in [place]. Son will transport. Shelbie Ammons RN MSN CCM Care Management [phone]

## 2015-11-15 NOTE — Progress Notes (Signed)
While rounding, [facility] made initial visit to room 116. Pt was agitated and in obvious pain. Pt asked for prayer to "make the pain go away" which was provided. Pt seemed more at ease after the prayer. CH is available for follow up as needed.    11/15/15 1200  Clinical Encounter Type  Visited With Patient  Visit Type Initial;Spiritual support  Referral From Nurse  Spiritual Encounters  Spiritual Needs Prayer;Emotional  Stress Factors  Patient Stress Factors Exhausted;Health changes

## 2015-11-15 NOTE — Progress Notes (Signed)
[facility] [place] NAME: Jordan Brewer    MR#:  [id number]  DATE OF BIRTH:  07/12/1940  SUBJECTIVE:  CHIEF COMPLAINT:   Chief Complaint  Patient presents with  . Chest Pain   -Patient with right sixth rib fracture and sternal fracture from fall, Who was just discharged from the hospital 2 days ago comes back with significant chest pain, inability to move. She is admitted for pain control. -Tearful again this morning complaining of significant pain.  REVIEW OF SYSTEMS:  Review of Systems  Constitutional: Positive for malaise/fatigue. Negative for chills and fever.  HENT: Negative for ear discharge, ear pain and tinnitus.   Eyes: Negative for blurred vision.  Respiratory: Negative for cough, shortness of breath and wheezing.   Cardiovascular: Positive for chest pain. Negative for palpitations and leg swelling.       From sternal fracture  Gastrointestinal: Negative for abdominal pain, constipation, diarrhea, nausea and vomiting.  Genitourinary: Negative for dysuria and urgency.  Musculoskeletal: Positive for joint pain and myalgias.  Neurological: Negative for dizziness, seizures and headaches.  Psychiatric/Behavioral: Negative for depression.    DRUG ALLERGIES:   Allergies  Allergen Reactions  . Codeine Nausea And Vomiting  . 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 Promethazine Nausea And Vomiting  . Lidoderm [Lidocaine] Rash    VITALS:  Blood pressure (!) 144/71, pulse 84, temperature 98.4 F (36.9 C), temperature source Oral, resp. rate 16, height 5\' 4"  (1.626 m), weight 71.5 kg (157 lb 11.2 oz), SpO2 93 %.  PHYSICAL EXAMINATION:  Physical Exam  GENERAL:  75 y.o.-year-old patient sitting in the bed with no acute distress.  EYES: Pupils equal, round, reactive to light and accommodation. No scleral icterus. Extraocular muscles intact.  HEENT: Head atraumatic, normocephalic. Oropharynx  and nasopharynx clear.  NECK:  Supple, no jugular venous distention. No thyroid enlargement, no tenderness.  LUNGS: Normal breath sounds bilaterally, no wheezing, rales,rhonchi or crepitation. No use of accessory muscles of respiration.  CARDIOVASCULAR: S1, S2 normal. No murmurs, rubs, or gallops. Tender in the mid chest, no erythema noted or bruising noted. ABDOMEN: Soft, nontender, nondistended. Bowel sounds present. No organomegaly or mass.  EXTREMITIES: No pedal edema, cyanosis, or clubbing.  NEUROLOGIC: Cranial nerves II through XII are intact. Muscle strength 5/5 in all extremities. Sensation intact. Gait not checked.  PSYCHIATRIC: The patient is alert and oriented x 3. Getting tearful easily  SKIN: No obvious rash, lesion, or ulcer.    LABORATORY PANEL:   CBC  Recent Labs Lab 11/15/15 0358  WBC 5.7  HGB 11.1*  HCT 34.6*  PLT 411   ------------------------------------------------------------------------------------------------------------------  Chemistries   Recent Labs Lab 11/15/15 0358  NA 138  K 4.7  CL 103  CO2 27  GLUCOSE 143*  BUN 16  CREATININE 0.89  CALCIUM 9.0  AST 17  ALT 11*  ALKPHOS 68  BILITOT 0.5   ------------------------------------------------------------------------------------------------------------------  Cardiac Enzymes  Recent Labs Lab 11/14/15 1237  TROPONINI <0.03   ------------------------------------------------------------------------------------------------------------------  RADIOLOGY:  Dg Chest 2 View  Result Date: 11/14/2015 CLINICAL DATA:  Right anterior sixth rib fracture and possible sternal fracture on a chest CTA dated 11/07/2015. The patient is currently complaining of severe sternal and rib pain. EXAM: CHEST  2 VIEW COMPARISON:  Chest CTA dated 11/07/2015 and chest radiographs dated 11/07/2015. FINDINGS: Normal sized heart. Clear lungs. And upper sternal fracture is better visualized today. No significant  displacement  or angulation. The right anterior sixth rib fracture is not visible on today's radiographs. Diffuse osteopenia is noted as well as bilateral shoulder degenerative changes and bilateral breast implants with capsular calcifications. Aortic calcifications are also noted. Thoracic spine degenerative changes. IMPRESSION: 1. No acute abnormality. 2. Better demonstrated upper sternal fracture. 3. Aortic atherosclerosis. Electronically Signed   By: Claudie Revering M.D.   On: 11/14/2015 13:11   Dg Humerus Right  Result Date: 11/14/2015 CLINICAL DATA:  Right upper arm pain. EXAM: RIGHT HUMERUS - 2+ VIEW COMPARISON:  None. FINDINGS: Moderate right glenohumeral joint degenerative changes. No fracture or dislocation seen. Distal rotator cuff calcification. IMPRESSION: 1. Moderate right glenohumeral joint degenerative changes. 2. Distal rotator cuff calcific tendinitis. Electronically Signed   By: Claudie Revering M.D.   On: 11/14/2015 13:12    EKG:   Orders placed or performed during the hospital encounter of 11/14/15  . EKG 12-Lead  . EKG 12-Lead    ASSESSMENT AND PLAN:   75 year old female with past medical history significant for hypertension, diabetes, hyperlipidemia and chronic back pain presents to the hospital after a fall and noted to have right rib and sternal fracture.  #1 chest pain-secondary to right sixth rib anterior and substernal cortex fracture. No other internal injuries noted. -Hemodynamically stable.  - Last week she had Echocardiogram done with no cardiac abnormalities. -CT with no pulmonary contusion last week -Continue Skelaxin for neck pain and muscular pain, increased fentanyl patch and is also on when necessary oral Dilaudid -Patient has high tolerance for pain medications. Pain management consult requested to see for nerve block can help -Continue incentive spirometer.   #2 hyperlipidemia-continue statin  #3 Depression-continue Cymbalta  #4 hypertension-on Norvasc,  Metoprolol.  -Also takes Florinef and hydrocortisone For her Addison's disease  #5 diabetes mellitus-on Lantus, trulicity and  sliding scale  #6 DVT prophylaxis-Lovenox    Physical therapy consult once pain is better.    All the records are reviewed and case discussed with Care Management/Social Workerr. Management plans discussed with the patient, family and they are in agreement.  CODE STATUS: DO NOT RESUSCITATE  TOTAL TIME TAKING CARE OF THIS PATIENT: 36 minutes.   POSSIBLE D/C 1-2 days, DEPENDING ON CLINICAL CONDITION.   Gladstone Lighter M.D on 11/15/2015 at 12:49 PM  Between 7am to 6pm - Pager - [phone]  After 6pm go to [email or web address] - password [email or web address] [facility]  Office  [phone]  CC: Primary care physician; Tracie Harrier, MD

## 2015-11-15 NOTE — Evaluation (Signed)
Physical Therapy Evaluation Patient Details Name: Jordan Brewer MRN: [id number] DOB: 1940-02-07 Today's Date: 11/15/2015   History of Present Illness  75 y.o. female who was here 1 week ago after fall with a right rib and sternal fracture. She was here the previous week with rectal bleeding.  Pt returns to hospital with c/o severe pain.  Clinical Impression  Pt is able to ambulate with walker relatively well though she is anxious and complains of pain t/o the entire episode.  She shows good balance and is generally safe but is very particular with what she will allow PT to do/assist.  Fortunately she did not need a lot of assist and was able to don socks b/l, rise to sitting/standing all w/o assist.  Overall pt did well but c/o excessive pain t/o the effort.  Pt feels she will be able to go home but does admit she will need assist.  States that son and neighbor girl will be able to assist as needed.     Follow Up Recommendations Home health PT    Equipment Recommendations  Rolling walker with 5" wheels (she reports she doesn't have on, likely she does)    Recommendations for Other Services       Precautions / Restrictions Precautions Precautions: Fall Restrictions Weight Bearing Restrictions: No      Mobility  Bed Mobility Overal bed mobility: Modified Independent Bed Mobility: Supine to Sit;Sit to Supine     Supine to sit: Supervision Sit to supine: Supervision   General bed mobility comments: Despite pain pt did well with getting to/from supine with light rail use and overall good effort - though she needed extra time/effort and encouragement secondary to pain  Transfers Overall transfer level: Modified independent Equipment used: Rolling walker (2 wheeled) Transfers: Sit to/from Stand Sit to Stand: Supervision         General transfer comment: Pt very cautious with rising to standing, but ultimately was able to do so w/o assist  Ambulation/Gait Ambulation/Gait  assistance: Supervision Ambulation Distance (Feet): 40 Feet Assistive device: Rolling walker (2 wheeled)       General Gait Details: slow steady cadence/gait speed; no LOB, she was self limiting secondary to pain  Stairs            Wheelchair Mobility    Modified Rankin (Stroke Patients Only)       Balance Overall balance assessment: Modified Independent   Sitting balance-Leahy Scale: Good       Standing balance-Leahy Scale: Good                               Pertinent Vitals/Pain Pain Assessment: 0-10 Pain Score: 9     Home Living Family/patient expects to be discharged to:: Private residence Living Arrangements: Alone Available Help at Discharge:  (minimally available son, neighbor, other possible assist) Type of Home: House Home Access: Stairs to enter Entrance Stairs-Rails: Can reach both;Right;Left Entrance Stairs-Number of Steps: 3 Home Layout: One level Home Equipment: Walker - 4 wheels;Cane - quad (pt reports she has 4 wheeled walker w/o seat? )      Prior Function Level of Independence: Independent with assistive device(s)         Comments: Ambulates with quad cane inside home and RW in community; has had ~6 falls in the last 6 months     Hand Dominance        Extremity/Trunk Assessment   Upper Extremity Assessment:  Generalized weakness (R ribs/sternum popping loudly with R UE elevation) RUE Deficits / Details: shoulder F/Abd AROM limited to ~90 deg due to pain, pt reports MD told her she has Bil torn RTC         Lower Extremity Assessment: Overall WFL for tasks assessed         Communication   Communication: No difficulties  Cognition Arousal/Alertness: Awake/alert Behavior During Therapy: Anxious;Restless;Impulsive Overall Cognitive Status: Within Functional Limits for tasks assessed                      General Comments      Exercises     Assessment/Plan    PT Assessment Patient needs continued  PT services  PT Problem List Decreased strength;Decreased range of motion;Decreased balance;Decreased mobility;Decreased activity tolerance;Decreased knowledge of use of DME;Decreased knowledge of precautions;Cardiopulmonary status limiting activity;Pain          PT Treatment Interventions DME instruction;Gait training;Stair training;Functional mobility training;Therapeutic activities;Therapeutic exercise;Balance training;Patient/family education    PT Goals (Current goals can be found in the Care Plan section)  Acute Rehab PT Goals Patient Stated Goal: "get rid of this terrible pain" PT Goal Formulation: With patient Time For Goal Achievement: 11/29/15 Potential to Achieve Goals: Fair    Frequency Min 2X/week   Barriers to discharge Decreased caregiver support      Co-evaluation               End of Session Equipment Utilized During Treatment: Gait belt Activity Tolerance: Patient limited by pain Patient left: in bed;with call bell/phone within reach;with nursing/sitter in room Nurse Communication: Mobility status    Functional Assessment Tool Used: clinical judgement Functional Limitation: Mobility: Walking and moving around Mobility: Walking and Moving Around Current Status [id number]): At least 20 percent but less than 40 percent impaired, limited or restricted Mobility: Walking and Moving Around Goal Status [phone]): At least 1 percent but less than 20 percent impaired, limited or restricted    Time: 1424-1452 PT Time Calculation (min) (ACUTE ONLY): 28 min   Charges:   PT Evaluation $PT Eval Low Complexity: 1 Procedure     PT G Codes:   PT G-Codes **NOT FOR INPATIENT CLASS** Functional Assessment Tool Used: clinical judgement Functional Limitation: Mobility: Walking and moving around Mobility: Walking and Moving Around Current Status [id number]): At least 20 percent but less than 40 percent impaired, limited or restricted Mobility: Walking and Moving Around Goal  Status [phone]): At least 1 percent but less than 20 percent impaired, limited or restricted    Kreg Shropshire, DPT 11/15/2015, 4:36 PM

## 2015-11-16 DIAGNOSIS — S2239XA Fracture of one rib, unspecified side, initial encounter for closed fracture: Secondary | ICD-10-CM | POA: Diagnosis not present

## 2015-11-16 DIAGNOSIS — I10 Essential (primary) hypertension: Secondary | ICD-10-CM | POA: Diagnosis not present

## 2015-11-16 DIAGNOSIS — E119 Type 2 diabetes mellitus without complications: Secondary | ICD-10-CM | POA: Diagnosis not present

## 2015-11-16 DIAGNOSIS — R0789 Other chest pain: Secondary | ICD-10-CM | POA: Diagnosis not present

## 2015-11-16 LAB — GLUCOSE, CAPILLARY
Glucose-Capillary: 146 mg/dL — ABNORMAL HIGH (ref 65–99)
Glucose-Capillary: 213 mg/dL — ABNORMAL HIGH (ref 65–99)

## 2015-11-16 MED ORDER — HYDROMORPHONE HCL 2 MG PO TABS: 2.0000 mg | ORAL_TABLET | Freq: Four times a day (QID) | ORAL | 0 refills | Status: DC | PRN

## 2015-11-16 MED ORDER — FENTANYL 12 MCG/HR TD PT72: 37.5000 ug | MEDICATED_PATCH | TRANSDERMAL | 0 refills | Status: DC

## 2015-11-16 MED ORDER — SENNA 8.6 MG PO TABS: 1.0000 | ORAL_TABLET | Freq: Two times a day (BID) | ORAL | 0 refills | Status: DC

## 2015-11-16 NOTE — Care Management (Signed)
Discharge to home today per Dr. Tressia Miners. Received telephone call from Floydene Flock, [facility] representative. [facility] has closed this case. Will need new orders for nursing, physical therapy, occupational therapy, nursing assistant, and social worker. Will update Dr. Tressia Miners. Son will transport. Shelbie Ammons RN MSN CCM Care Management [phone]

## 2015-11-16 NOTE — Discharge Summary (Signed)
[facility] [place] NAME: Jordan Brewer    MR#:  [id number]  DATE OF BIRTH:  09-Jan-1940  DATE OF ADMISSION:  11/14/2015   ADMITTING PHYSICIAN: Nicholes Mango, MD  DATE OF DISCHARGE: 11/16/2015  2:07 PM  PRIMARY CARE PHYSICIAN: Tracie Harrier, MD   ADMISSION DIAGNOSIS:   Uncontrolled pain [R52] Closed fracture of body of sternum, initial encounter [S22.22XA]  DISCHARGE DIAGNOSIS:   Active Problems:   Pain management   SECONDARY DIAGNOSIS:   Past Medical History:  Diagnosis Date  . Addison's disease ([place])   . Ankle fracture, left 02/2012  . Arthritis    "all over my body" (04/10/2012)  . Bipolar depression ([place])   . Cancer ([place])   . Chronic back pain    "since my hip OR in 09/2010"  . Depression   . DVT, lower extremity ([place]) 09/2010   "left; after hip replacement" (04/10/2012)  . Fall at home 02/2012   'foot got caught on heating pad cord; broke left ankle" (04/10/2012)  . GERD (gastroesophageal reflux disease)   . History of blood transfusion   . Hypercholesteremia   . Hypertension   . Syncope and collapse 04/10/2012   "w/loss of consciousness" (04/10/2012)  . Type II diabetes mellitus [facility])     HOSPITAL COURSE:   75 year old female with past medical history significant for hypertension, diabetes, hyperlipidemia and chronic back pain presents to the hospital after a fall and noted to have right rib and sternal fracture.  #1 chest pain-secondary to right sixth rib anterior and substernal cortex fracture. No other internal injuries noted. -Hemodynamically stable.  - Last week she had Echocardiogram done with no cardiac abnormalities. -CT with no pulmonary contusion last week -Continue Skelaxin for neck pain and muscular pain, increased fentanyl patch to 37.16mcg q3days and is also on when necessary oral Dilaudid -Patient has high tolerance for pain medications. Pain management consult as outpatient to see if nerve block can  help -Continue incentive spirometer.   #2 hyperlipidemia-continue statin  #3 Depression-continue Cymbalta  #4 hypertension-on Norvasc, Metoprolol.  -Also takes Florinef and hydrocortisone For her Addison's disease  #5 diabetes mellitus-on Lantus, trulicity and aspart insulin with meals- as outpatient.     Physical therapy consulted and recommended home health. Being discharged today  DISCHARGE CONDITIONS:   Guarded  CONSULTS OBTAINED:   None  DRUG ALLERGIES:   Allergies  Allergen Reactions  . Codeine Nausea And Vomiting  . 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 Promethazine Nausea And Vomiting  . Lidoderm [Lidocaine] Rash   DISCHARGE MEDICATIONS:     Medication List    STOP taking these medications   nystatin 100000 UNIT/ML suspension Commonly known as:  MYCOSTATIN   nystatin powder Commonly known as:  MYCOSTATIN/NYSTOP     TAKE these medications   amLODipine 10 MG tablet Commonly known as:  NORVASC Take 10 mg by mouth daily.   docusate sodium 100 MG capsule Commonly known as:  COLACE Take 1 capsule (100 mg total) by mouth 2 (two) times daily. What changed:  when to take this  reasons to take this   DULoxetine 60 MG capsule Commonly known as:  CYMBALTA Take 60 mg by mouth daily.   fentaNYL 12 MCG/HR Commonly known as:  DURAGESIC - dosed mcg/hr Place 3 patches (37.5 mcg total) onto the skin every 3 (three) days. Start taking on:  11/17/2015 What changed:  how much to take  fludrocortisone 0.1 MG tablet Commonly known as:  FLORINEF Take 0.1 mg by mouth daily.   hydrocortisone 10 MG tablet Commonly known as:  CORTEF Take 10-20 mg by mouth 2 (two) times daily. 20 mg every morning and 10 mg at bedtime   HYDROmorphone 2 MG tablet Commonly known as:  DILAUDID Take 1 tablet (2 mg total) by mouth every 6 (six) hours as needed for severe pain.   insulin aspart 100 UNIT/ML injection Commonly known as:   novoLOG Inject into the skin 3 (three) times daily before meals. Per sliding scale 8 units am,10 units noon, 12 units pm   insulin glargine 100 unit/mL Sopn Commonly known as:  LANTUS Inject 10 Units into the skin at bedtime.   lovastatin 40 MG tablet Commonly known as:  MEVACOR Take 40 mg by mouth at bedtime.   metaxalone 400 MG tablet Commonly known as:  SKELAXIN Take 1 tablet (400 mg total) by mouth 3 (three) times daily. X 10 days   metoprolol 50 MG tablet Commonly known as:  LOPRESSOR Take 1 tablet (50 mg total) by mouth 2 (two) times daily.   omeprazole 40 MG capsule Commonly known as:  PRILOSEC Take 40 mg by mouth 2 (two) times daily.   polyethylene glycol packet Commonly known as:  MIRALAX / GLYCOLAX Take 17 g by mouth daily as needed for mild constipation.   senna 8.6 MG Tabs tablet Commonly known as:  SENOKOT Take 1 tablet (8.6 mg total) by mouth 2 (two) times daily.   TRULICITY [id number] Sopn Generic drug:  Dulaglutide Inject 0.5 mLs into the skin every Wednesday.        DISCHARGE INSTRUCTIONS:   1. PCP f/u in 1 week- pain management referral to be given by PCP  DIET:   Cardiac diet  ACTIVITY:   Activity as tolerated  OXYGEN:   Home Oxygen: No.  Oxygen Delivery: room air  DISCHARGE LOCATION:   home   If you experience worsening of your admission symptoms, develop shortness of breath, life threatening emergency, suicidal or homicidal thoughts you must seek medical attention immediately by calling 911 or calling your MD immediately  if symptoms less severe.  You Must read complete instructions/literature along with all the possible adverse reactions/side effects for all the Medicines you take and that have been prescribed to you. Take any new Medicines after you have completely understood and accpet all the possible adverse reactions/side effects.   Please note  You were cared for by a hospitalist during your hospital stay. If you have  any questions about your discharge medications or the care you received while you were in the hospital after you are discharged, you can call the unit and asked to speak with the hospitalist on call if the hospitalist that took care of you is not available. Once you are discharged, your primary care physician will handle any further medical issues. Please note that NO REFILLS for any discharge medications will be authorized once you are discharged, as it is imperative that you return to your primary care physician (or establish a relationship with a primary care physician if you do not have one) for your aftercare needs so that they can reassess your need for medications and monitor your lab values.    On the day of Discharge:  VITAL SIGNS:   Blood pressure 131/70, pulse 86, temperature 97.6 F (36.4 C), temperature source Oral, resp. rate 18, height 5\' 4"  (1.626 m), weight 71.5 kg (157 lb 11.2 oz),  SpO2 92 %.  PHYSICAL EXAMINATION:   GENERAL:  75 y.o.-year-old patient sitting in the bed with no acute distress.  EYES: Pupils equal, round, reactive to light and accommodation. No scleral icterus. Extraocular muscles intact.  HEENT: Head atraumatic, normocephalic. Oropharynx and nasopharynx clear.  NECK:  Supple, no jugular venous distention. No thyroid enlargement, no tenderness.  LUNGS: Normal breath sounds bilaterally, no wheezing, rales,rhonchi or crepitation. No use of accessory muscles of respiration.  CARDIOVASCULAR: S1, S2 normal. No murmurs, rubs, or gallops. Tender in the mid chest, no erythema noted or bruising noted. ABDOMEN: Soft, nontender, nondistended. Bowel sounds present. No organomegaly or mass.  EXTREMITIES: No pedal edema, cyanosis, or clubbing.  NEUROLOGIC: Cranial nerves II through XII are intact. Muscle strength 5/5 in all extremities. Sensation intact. Gait not checked.  PSYCHIATRIC: The patient is alert and oriented x 3.  SKIN: No obvious rash, lesion, or ulcer.     DATA REVIEW:   CBC  Recent Labs Lab 11/15/15 0358  WBC 5.7  HGB 11.1*  HCT 34.6*  PLT 411    Chemistries   Recent Labs Lab 11/15/15 0358  NA 138  K 4.7  CL 103  CO2 27  GLUCOSE 143*  BUN 16  CREATININE 0.89  CALCIUM 9.0  AST 17  ALT 11*  ALKPHOS 68  BILITOT 0.5     Microbiology Results  Results for orders placed or performed during the hospital encounter of 10/30/15  CULTURE, BLOOD (ROUTINE X 2) w Reflex to ID Panel     Status: None   Collection Time: 10/30/15  1:53 PM  Result Value Ref Range Status   Specimen Description BLOOD LEFT ASSIST CONTROL  Final   Special Requests BOTTLES DRAWN AEROBIC AND ANAEROBIC 5CC  Final   Culture NO GROWTH 5 DAYS  Final   Report Status 11/04/2015 FINAL  Final  CULTURE, BLOOD (ROUTINE X 2) w Reflex to ID Panel     Status: None   Collection Time: 10/30/15  1:53 PM  Result Value Ref Range Status   Specimen Description BLOOD LEFT HAND  Final   Special Requests BOTTLES DRAWN AEROBIC AND ANAEROBIC 5CC  Final   Culture NO GROWTH 5 DAYS  Final   Report Status 11/04/2015 FINAL  Final    RADIOLOGY:  No results found.   Management plans discussed with the patient, family and they are in agreement.  CODE STATUS:     Code Status Orders        Start     Ordered   11/14/15 1752  Full code  Continuous     11/14/15 1751    Code Status History    Date Active Date Inactive Code Status Order ID Comments User Context   11/07/2015  1:31 PM 11/11/2015  6:27 PM DNR [id number]  Demetrios Loll, MD Inpatient   10/30/2015  1:20 PM 11/02/2015  5:23 PM Full Code [id number]  Lytle Butte, MD ED   11/13/2014  5:38 PM 11/16/2014  7:20 PM Full Code [id number]  Nicholes Mango, MD Inpatient   04/10/2012  6:13 PM 04/11/2012  9:57 PM Full Code [id number]  Charlynne Cousins, MD Inpatient    Advance Directive Documentation   Flowsheet Row Most Recent Value  Type of Advance Directive  Living will  Pre-existing out of facility DNR order (yellow form or pink  MOST form)  No data  "MOST" Form in Place?  No data      TOTAL TIME TAKING CARE OF THIS PATIENT: 37 minutes.  Gladstone Lighter M.D on 11/16/2015 at 3:14 PM  Between 7am to 6pm - Pager - [phone]  After 6pm go to [email or web address] - Proofreader  [facility]  Office  [phone]  CC: Primary care physician; Tracie Harrier, MD   Note: This dictation was prepared with Dragon dictation along with smaller phrase technology. Any transcriptional errors that result from this process are unintentional.

## 2015-11-16 NOTE — Progress Notes (Signed)
Pt is being discharged home. Discharge papers given and explained to pt and pt's son. Both verbalized understanding. F/U appointments and meds reviewed with pt. RX given.

## 2015-11-18 DIAGNOSIS — K219 Gastro-esophageal reflux disease without esophagitis: Secondary | ICD-10-CM | POA: Diagnosis not present

## 2015-11-18 DIAGNOSIS — E271 Primary adrenocortical insufficiency: Secondary | ICD-10-CM | POA: Diagnosis not present

## 2015-11-18 DIAGNOSIS — E119 Type 2 diabetes mellitus without complications: Secondary | ICD-10-CM | POA: Diagnosis not present

## 2015-11-18 DIAGNOSIS — W19XXXD Unspecified fall, subsequent encounter: Secondary | ICD-10-CM | POA: Diagnosis not present

## 2015-11-18 DIAGNOSIS — G8929 Other chronic pain: Secondary | ICD-10-CM | POA: Diagnosis not present

## 2015-11-18 DIAGNOSIS — F319 Bipolar disorder, unspecified: Secondary | ICD-10-CM | POA: Diagnosis not present

## 2015-11-18 DIAGNOSIS — I10 Essential (primary) hypertension: Secondary | ICD-10-CM | POA: Diagnosis not present

## 2015-11-18 DIAGNOSIS — S2231XD Fracture of one rib, right side, subsequent encounter for fracture with routine healing: Secondary | ICD-10-CM | POA: Diagnosis not present

## 2015-11-18 DIAGNOSIS — S2220XD Unspecified fracture of sternum, subsequent encounter for fracture with routine healing: Secondary | ICD-10-CM | POA: Diagnosis not present

## 2015-11-21 DIAGNOSIS — F329 Major depressive disorder, single episode, unspecified: Secondary | ICD-10-CM | POA: Diagnosis not present

## 2015-11-21 DIAGNOSIS — R0789 Other chest pain: Secondary | ICD-10-CM | POA: Diagnosis not present

## 2015-11-21 DIAGNOSIS — Z794 Long term (current) use of insulin: Secondary | ICD-10-CM | POA: Diagnosis not present

## 2015-11-21 DIAGNOSIS — E119 Type 2 diabetes mellitus without complications: Secondary | ICD-10-CM | POA: Diagnosis not present

## 2015-11-21 DIAGNOSIS — I10 Essential (primary) hypertension: Secondary | ICD-10-CM | POA: Diagnosis not present

## 2015-11-21 DIAGNOSIS — G894 Chronic pain syndrome: Secondary | ICD-10-CM | POA: Diagnosis not present

## 2015-11-21 DIAGNOSIS — Z09 Encounter for follow-up examination after completed treatment for conditions other than malignant neoplasm: Secondary | ICD-10-CM | POA: Diagnosis not present

## 2015-11-22 DIAGNOSIS — S2220XD Unspecified fracture of sternum, subsequent encounter for fracture with routine healing: Secondary | ICD-10-CM | POA: Diagnosis not present

## 2015-11-22 DIAGNOSIS — E271 Primary adrenocortical insufficiency: Secondary | ICD-10-CM | POA: Diagnosis not present

## 2015-11-22 DIAGNOSIS — E119 Type 2 diabetes mellitus without complications: Secondary | ICD-10-CM | POA: Diagnosis not present

## 2015-11-22 DIAGNOSIS — F319 Bipolar disorder, unspecified: Secondary | ICD-10-CM | POA: Diagnosis not present

## 2015-11-22 DIAGNOSIS — I10 Essential (primary) hypertension: Secondary | ICD-10-CM | POA: Diagnosis not present

## 2015-11-22 DIAGNOSIS — K219 Gastro-esophageal reflux disease without esophagitis: Secondary | ICD-10-CM | POA: Diagnosis not present

## 2015-11-22 DIAGNOSIS — W19XXXD Unspecified fall, subsequent encounter: Secondary | ICD-10-CM | POA: Diagnosis not present

## 2015-11-22 DIAGNOSIS — G8929 Other chronic pain: Secondary | ICD-10-CM | POA: Diagnosis not present

## 2015-11-22 DIAGNOSIS — S2231XD Fracture of one rib, right side, subsequent encounter for fracture with routine healing: Secondary | ICD-10-CM | POA: Diagnosis not present

## 2015-11-23 DIAGNOSIS — S2220XD Unspecified fracture of sternum, subsequent encounter for fracture with routine healing: Secondary | ICD-10-CM | POA: Diagnosis not present

## 2015-11-23 DIAGNOSIS — I10 Essential (primary) hypertension: Secondary | ICD-10-CM | POA: Diagnosis not present

## 2015-11-23 DIAGNOSIS — E271 Primary adrenocortical insufficiency: Secondary | ICD-10-CM | POA: Diagnosis not present

## 2015-11-23 DIAGNOSIS — F319 Bipolar disorder, unspecified: Secondary | ICD-10-CM | POA: Diagnosis not present

## 2015-11-23 DIAGNOSIS — G8929 Other chronic pain: Secondary | ICD-10-CM | POA: Diagnosis not present

## 2015-11-23 DIAGNOSIS — K219 Gastro-esophageal reflux disease without esophagitis: Secondary | ICD-10-CM | POA: Diagnosis not present

## 2015-11-23 DIAGNOSIS — S2231XD Fracture of one rib, right side, subsequent encounter for fracture with routine healing: Secondary | ICD-10-CM | POA: Diagnosis not present

## 2015-11-23 DIAGNOSIS — W19XXXD Unspecified fall, subsequent encounter: Secondary | ICD-10-CM | POA: Diagnosis not present

## 2015-11-23 DIAGNOSIS — E119 Type 2 diabetes mellitus without complications: Secondary | ICD-10-CM | POA: Diagnosis not present

## 2015-11-25 DIAGNOSIS — I10 Essential (primary) hypertension: Secondary | ICD-10-CM | POA: Diagnosis not present

## 2015-11-25 DIAGNOSIS — E119 Type 2 diabetes mellitus without complications: Secondary | ICD-10-CM | POA: Diagnosis not present

## 2015-11-25 DIAGNOSIS — W19XXXD Unspecified fall, subsequent encounter: Secondary | ICD-10-CM | POA: Diagnosis not present

## 2015-11-25 DIAGNOSIS — S2231XD Fracture of one rib, right side, subsequent encounter for fracture with routine healing: Secondary | ICD-10-CM | POA: Diagnosis not present

## 2015-11-25 DIAGNOSIS — G8929 Other chronic pain: Secondary | ICD-10-CM | POA: Diagnosis not present

## 2015-11-25 DIAGNOSIS — S2220XD Unspecified fracture of sternum, subsequent encounter for fracture with routine healing: Secondary | ICD-10-CM | POA: Diagnosis not present

## 2015-11-25 DIAGNOSIS — K219 Gastro-esophageal reflux disease without esophagitis: Secondary | ICD-10-CM | POA: Diagnosis not present

## 2015-11-25 DIAGNOSIS — F319 Bipolar disorder, unspecified: Secondary | ICD-10-CM | POA: Diagnosis not present

## 2015-11-25 DIAGNOSIS — E271 Primary adrenocortical insufficiency: Secondary | ICD-10-CM | POA: Diagnosis not present

## 2015-11-28 DIAGNOSIS — I10 Essential (primary) hypertension: Secondary | ICD-10-CM | POA: Diagnosis not present

## 2015-11-28 DIAGNOSIS — G8929 Other chronic pain: Secondary | ICD-10-CM | POA: Diagnosis not present

## 2015-11-28 DIAGNOSIS — F319 Bipolar disorder, unspecified: Secondary | ICD-10-CM | POA: Diagnosis not present

## 2015-11-28 DIAGNOSIS — E271 Primary adrenocortical insufficiency: Secondary | ICD-10-CM | POA: Diagnosis not present

## 2015-11-28 DIAGNOSIS — W19XXXD Unspecified fall, subsequent encounter: Secondary | ICD-10-CM | POA: Diagnosis not present

## 2015-11-28 DIAGNOSIS — S2220XD Unspecified fracture of sternum, subsequent encounter for fracture with routine healing: Secondary | ICD-10-CM | POA: Diagnosis not present

## 2015-11-28 DIAGNOSIS — K219 Gastro-esophageal reflux disease without esophagitis: Secondary | ICD-10-CM | POA: Diagnosis not present

## 2015-11-28 DIAGNOSIS — E119 Type 2 diabetes mellitus without complications: Secondary | ICD-10-CM | POA: Diagnosis not present

## 2015-11-28 DIAGNOSIS — S2231XD Fracture of one rib, right side, subsequent encounter for fracture with routine healing: Secondary | ICD-10-CM | POA: Diagnosis not present

## 2015-11-29 DIAGNOSIS — E119 Type 2 diabetes mellitus without complications: Secondary | ICD-10-CM | POA: Diagnosis not present

## 2015-11-29 DIAGNOSIS — F319 Bipolar disorder, unspecified: Secondary | ICD-10-CM | POA: Diagnosis not present

## 2015-11-29 DIAGNOSIS — E271 Primary adrenocortical insufficiency: Secondary | ICD-10-CM | POA: Diagnosis not present

## 2015-11-29 DIAGNOSIS — W19XXXD Unspecified fall, subsequent encounter: Secondary | ICD-10-CM | POA: Diagnosis not present

## 2015-11-29 DIAGNOSIS — S2231XD Fracture of one rib, right side, subsequent encounter for fracture with routine healing: Secondary | ICD-10-CM | POA: Diagnosis not present

## 2015-11-29 DIAGNOSIS — S2220XD Unspecified fracture of sternum, subsequent encounter for fracture with routine healing: Secondary | ICD-10-CM | POA: Diagnosis not present

## 2015-11-29 DIAGNOSIS — I10 Essential (primary) hypertension: Secondary | ICD-10-CM | POA: Diagnosis not present

## 2015-11-29 DIAGNOSIS — G8929 Other chronic pain: Secondary | ICD-10-CM | POA: Diagnosis not present

## 2015-11-29 DIAGNOSIS — K219 Gastro-esophageal reflux disease without esophagitis: Secondary | ICD-10-CM | POA: Diagnosis not present

## 2015-11-30 DIAGNOSIS — S2231XD Fracture of one rib, right side, subsequent encounter for fracture with routine healing: Secondary | ICD-10-CM | POA: Diagnosis not present

## 2015-11-30 DIAGNOSIS — E119 Type 2 diabetes mellitus without complications: Secondary | ICD-10-CM | POA: Diagnosis not present

## 2015-11-30 DIAGNOSIS — I10 Essential (primary) hypertension: Secondary | ICD-10-CM | POA: Diagnosis not present

## 2015-11-30 DIAGNOSIS — W19XXXD Unspecified fall, subsequent encounter: Secondary | ICD-10-CM | POA: Diagnosis not present

## 2015-11-30 DIAGNOSIS — G8929 Other chronic pain: Secondary | ICD-10-CM | POA: Diagnosis not present

## 2015-11-30 DIAGNOSIS — E271 Primary adrenocortical insufficiency: Secondary | ICD-10-CM | POA: Diagnosis not present

## 2015-11-30 DIAGNOSIS — F319 Bipolar disorder, unspecified: Secondary | ICD-10-CM | POA: Diagnosis not present

## 2015-11-30 DIAGNOSIS — S2220XD Unspecified fracture of sternum, subsequent encounter for fracture with routine healing: Secondary | ICD-10-CM | POA: Diagnosis not present

## 2015-11-30 DIAGNOSIS — K219 Gastro-esophageal reflux disease without esophagitis: Secondary | ICD-10-CM | POA: Diagnosis not present

## 2015-12-05 DIAGNOSIS — S2220XD Unspecified fracture of sternum, subsequent encounter for fracture with routine healing: Secondary | ICD-10-CM | POA: Diagnosis not present

## 2015-12-05 DIAGNOSIS — S2231XD Fracture of one rib, right side, subsequent encounter for fracture with routine healing: Secondary | ICD-10-CM | POA: Diagnosis not present

## 2015-12-05 DIAGNOSIS — W19XXXD Unspecified fall, subsequent encounter: Secondary | ICD-10-CM | POA: Diagnosis not present

## 2015-12-05 DIAGNOSIS — E119 Type 2 diabetes mellitus without complications: Secondary | ICD-10-CM | POA: Diagnosis not present

## 2015-12-05 DIAGNOSIS — G8929 Other chronic pain: Secondary | ICD-10-CM | POA: Diagnosis not present

## 2015-12-05 DIAGNOSIS — E271 Primary adrenocortical insufficiency: Secondary | ICD-10-CM | POA: Diagnosis not present

## 2015-12-05 DIAGNOSIS — K219 Gastro-esophageal reflux disease without esophagitis: Secondary | ICD-10-CM | POA: Diagnosis not present

## 2015-12-05 DIAGNOSIS — I10 Essential (primary) hypertension: Secondary | ICD-10-CM | POA: Diagnosis not present

## 2015-12-05 DIAGNOSIS — F319 Bipolar disorder, unspecified: Secondary | ICD-10-CM | POA: Diagnosis not present

## 2015-12-06 DIAGNOSIS — E119 Type 2 diabetes mellitus without complications: Secondary | ICD-10-CM | POA: Diagnosis not present

## 2015-12-06 DIAGNOSIS — S2231XD Fracture of one rib, right side, subsequent encounter for fracture with routine healing: Secondary | ICD-10-CM | POA: Diagnosis not present

## 2015-12-06 DIAGNOSIS — F319 Bipolar disorder, unspecified: Secondary | ICD-10-CM | POA: Diagnosis not present

## 2015-12-06 DIAGNOSIS — W19XXXD Unspecified fall, subsequent encounter: Secondary | ICD-10-CM | POA: Diagnosis not present

## 2015-12-06 DIAGNOSIS — K219 Gastro-esophageal reflux disease without esophagitis: Secondary | ICD-10-CM | POA: Diagnosis not present

## 2015-12-06 DIAGNOSIS — I10 Essential (primary) hypertension: Secondary | ICD-10-CM | POA: Diagnosis not present

## 2015-12-06 DIAGNOSIS — G8929 Other chronic pain: Secondary | ICD-10-CM | POA: Diagnosis not present

## 2015-12-06 DIAGNOSIS — E271 Primary adrenocortical insufficiency: Secondary | ICD-10-CM | POA: Diagnosis not present

## 2015-12-06 DIAGNOSIS — S2220XD Unspecified fracture of sternum, subsequent encounter for fracture with routine healing: Secondary | ICD-10-CM | POA: Diagnosis not present

## 2015-12-07 DIAGNOSIS — W19XXXD Unspecified fall, subsequent encounter: Secondary | ICD-10-CM | POA: Diagnosis not present

## 2015-12-07 DIAGNOSIS — F319 Bipolar disorder, unspecified: Secondary | ICD-10-CM | POA: Diagnosis not present

## 2015-12-07 DIAGNOSIS — S2220XD Unspecified fracture of sternum, subsequent encounter for fracture with routine healing: Secondary | ICD-10-CM | POA: Diagnosis not present

## 2015-12-07 DIAGNOSIS — K219 Gastro-esophageal reflux disease without esophagitis: Secondary | ICD-10-CM | POA: Diagnosis not present

## 2015-12-07 DIAGNOSIS — S2231XD Fracture of one rib, right side, subsequent encounter for fracture with routine healing: Secondary | ICD-10-CM | POA: Diagnosis not present

## 2015-12-07 DIAGNOSIS — E271 Primary adrenocortical insufficiency: Secondary | ICD-10-CM | POA: Diagnosis not present

## 2015-12-07 DIAGNOSIS — G8929 Other chronic pain: Secondary | ICD-10-CM | POA: Diagnosis not present

## 2015-12-07 DIAGNOSIS — E119 Type 2 diabetes mellitus without complications: Secondary | ICD-10-CM | POA: Diagnosis not present

## 2015-12-07 DIAGNOSIS — I10 Essential (primary) hypertension: Secondary | ICD-10-CM | POA: Diagnosis not present

## 2015-12-09 DIAGNOSIS — K219 Gastro-esophageal reflux disease without esophagitis: Secondary | ICD-10-CM | POA: Diagnosis not present

## 2015-12-09 DIAGNOSIS — S2220XD Unspecified fracture of sternum, subsequent encounter for fracture with routine healing: Secondary | ICD-10-CM | POA: Diagnosis not present

## 2015-12-09 DIAGNOSIS — E119 Type 2 diabetes mellitus without complications: Secondary | ICD-10-CM | POA: Diagnosis not present

## 2015-12-09 DIAGNOSIS — W19XXXD Unspecified fall, subsequent encounter: Secondary | ICD-10-CM | POA: Diagnosis not present

## 2015-12-09 DIAGNOSIS — E271 Primary adrenocortical insufficiency: Secondary | ICD-10-CM | POA: Diagnosis not present

## 2015-12-09 DIAGNOSIS — I10 Essential (primary) hypertension: Secondary | ICD-10-CM | POA: Diagnosis not present

## 2015-12-09 DIAGNOSIS — S2231XD Fracture of one rib, right side, subsequent encounter for fracture with routine healing: Secondary | ICD-10-CM | POA: Diagnosis not present

## 2015-12-09 DIAGNOSIS — F319 Bipolar disorder, unspecified: Secondary | ICD-10-CM | POA: Diagnosis not present

## 2015-12-09 DIAGNOSIS — G8929 Other chronic pain: Secondary | ICD-10-CM | POA: Diagnosis not present

## 2015-12-12 DIAGNOSIS — S2220XD Unspecified fracture of sternum, subsequent encounter for fracture with routine healing: Secondary | ICD-10-CM | POA: Diagnosis not present

## 2015-12-12 DIAGNOSIS — E271 Primary adrenocortical insufficiency: Secondary | ICD-10-CM | POA: Diagnosis not present

## 2015-12-12 DIAGNOSIS — W19XXXD Unspecified fall, subsequent encounter: Secondary | ICD-10-CM | POA: Diagnosis not present

## 2015-12-12 DIAGNOSIS — G8929 Other chronic pain: Secondary | ICD-10-CM | POA: Diagnosis not present

## 2015-12-12 DIAGNOSIS — E119 Type 2 diabetes mellitus without complications: Secondary | ICD-10-CM | POA: Diagnosis not present

## 2015-12-12 DIAGNOSIS — K219 Gastro-esophageal reflux disease without esophagitis: Secondary | ICD-10-CM | POA: Diagnosis not present

## 2015-12-12 DIAGNOSIS — I10 Essential (primary) hypertension: Secondary | ICD-10-CM | POA: Diagnosis not present

## 2015-12-12 DIAGNOSIS — F319 Bipolar disorder, unspecified: Secondary | ICD-10-CM | POA: Diagnosis not present

## 2015-12-12 DIAGNOSIS — S2231XD Fracture of one rib, right side, subsequent encounter for fracture with routine healing: Secondary | ICD-10-CM | POA: Diagnosis not present

## 2015-12-15 DIAGNOSIS — K219 Gastro-esophageal reflux disease without esophagitis: Secondary | ICD-10-CM | POA: Diagnosis not present

## 2015-12-15 DIAGNOSIS — S2220XD Unspecified fracture of sternum, subsequent encounter for fracture with routine healing: Secondary | ICD-10-CM | POA: Diagnosis not present

## 2015-12-15 DIAGNOSIS — G8929 Other chronic pain: Secondary | ICD-10-CM | POA: Diagnosis not present

## 2015-12-15 DIAGNOSIS — S2231XD Fracture of one rib, right side, subsequent encounter for fracture with routine healing: Secondary | ICD-10-CM | POA: Diagnosis not present

## 2015-12-15 DIAGNOSIS — W19XXXD Unspecified fall, subsequent encounter: Secondary | ICD-10-CM | POA: Diagnosis not present

## 2015-12-15 DIAGNOSIS — I10 Essential (primary) hypertension: Secondary | ICD-10-CM | POA: Diagnosis not present

## 2015-12-15 DIAGNOSIS — F319 Bipolar disorder, unspecified: Secondary | ICD-10-CM | POA: Diagnosis not present

## 2015-12-15 DIAGNOSIS — E271 Primary adrenocortical insufficiency: Secondary | ICD-10-CM | POA: Diagnosis not present

## 2015-12-15 DIAGNOSIS — E119 Type 2 diabetes mellitus without complications: Secondary | ICD-10-CM | POA: Diagnosis not present

## 2015-12-21 DIAGNOSIS — I10 Essential (primary) hypertension: Secondary | ICD-10-CM | POA: Diagnosis not present

## 2015-12-21 DIAGNOSIS — E271 Primary adrenocortical insufficiency: Secondary | ICD-10-CM | POA: Diagnosis not present

## 2015-12-21 DIAGNOSIS — W19XXXD Unspecified fall, subsequent encounter: Secondary | ICD-10-CM | POA: Diagnosis not present

## 2015-12-21 DIAGNOSIS — S2220XD Unspecified fracture of sternum, subsequent encounter for fracture with routine healing: Secondary | ICD-10-CM | POA: Diagnosis not present

## 2015-12-21 DIAGNOSIS — S2231XD Fracture of one rib, right side, subsequent encounter for fracture with routine healing: Secondary | ICD-10-CM | POA: Diagnosis not present

## 2015-12-21 DIAGNOSIS — E119 Type 2 diabetes mellitus without complications: Secondary | ICD-10-CM | POA: Diagnosis not present

## 2015-12-21 DIAGNOSIS — K219 Gastro-esophageal reflux disease without esophagitis: Secondary | ICD-10-CM | POA: Diagnosis not present

## 2015-12-21 DIAGNOSIS — G8929 Other chronic pain: Secondary | ICD-10-CM | POA: Diagnosis not present

## 2015-12-21 DIAGNOSIS — F319 Bipolar disorder, unspecified: Secondary | ICD-10-CM | POA: Diagnosis not present

## 2016-01-08 DIAGNOSIS — E119 Type 2 diabetes mellitus without complications: Secondary | ICD-10-CM | POA: Diagnosis not present

## 2016-01-08 DIAGNOSIS — W19XXXD Unspecified fall, subsequent encounter: Secondary | ICD-10-CM | POA: Diagnosis not present

## 2016-01-08 DIAGNOSIS — I10 Essential (primary) hypertension: Secondary | ICD-10-CM | POA: Diagnosis not present

## 2016-01-08 DIAGNOSIS — K219 Gastro-esophageal reflux disease without esophagitis: Secondary | ICD-10-CM | POA: Diagnosis not present

## 2016-01-08 DIAGNOSIS — E271 Primary adrenocortical insufficiency: Secondary | ICD-10-CM | POA: Diagnosis not present

## 2016-01-08 DIAGNOSIS — S2220XD Unspecified fracture of sternum, subsequent encounter for fracture with routine healing: Secondary | ICD-10-CM | POA: Diagnosis not present

## 2016-01-08 DIAGNOSIS — F319 Bipolar disorder, unspecified: Secondary | ICD-10-CM | POA: Diagnosis not present

## 2016-01-08 DIAGNOSIS — G8929 Other chronic pain: Secondary | ICD-10-CM | POA: Diagnosis not present

## 2016-01-08 DIAGNOSIS — S2231XD Fracture of one rib, right side, subsequent encounter for fracture with routine healing: Secondary | ICD-10-CM | POA: Diagnosis not present

## 2016-02-07 DIAGNOSIS — C44722 Squamous cell carcinoma of skin of right lower limb, including hip: Secondary | ICD-10-CM | POA: Diagnosis not present

## 2016-02-07 DIAGNOSIS — D0471 Carcinoma in situ of skin of right lower limb, including hip: Secondary | ICD-10-CM | POA: Diagnosis not present

## 2016-02-09 DIAGNOSIS — L989 Disorder of the skin and subcutaneous tissue, unspecified: Secondary | ICD-10-CM | POA: Diagnosis not present

## 2016-02-09 DIAGNOSIS — I10 Essential (primary) hypertension: Secondary | ICD-10-CM | POA: Diagnosis not present

## 2016-02-09 DIAGNOSIS — F419 Anxiety disorder, unspecified: Secondary | ICD-10-CM | POA: Diagnosis not present

## 2016-02-09 DIAGNOSIS — F329 Major depressive disorder, single episode, unspecified: Secondary | ICD-10-CM | POA: Diagnosis not present

## 2016-02-09 DIAGNOSIS — G894 Chronic pain syndrome: Secondary | ICD-10-CM | POA: Diagnosis not present

## 2016-02-09 DIAGNOSIS — M25511 Pain in right shoulder: Secondary | ICD-10-CM | POA: Diagnosis not present

## 2016-02-09 DIAGNOSIS — G8929 Other chronic pain: Secondary | ICD-10-CM | POA: Diagnosis not present

## 2016-02-09 DIAGNOSIS — L570 Actinic keratosis: Secondary | ICD-10-CM | POA: Diagnosis not present

## 2016-02-09 DIAGNOSIS — E119 Type 2 diabetes mellitus without complications: Secondary | ICD-10-CM | POA: Diagnosis not present

## 2016-02-13 DIAGNOSIS — F419 Anxiety disorder, unspecified: Secondary | ICD-10-CM | POA: Diagnosis not present

## 2016-02-13 DIAGNOSIS — Z1231 Encounter for screening mammogram for malignant neoplasm of breast: Secondary | ICD-10-CM | POA: Diagnosis not present

## 2016-02-13 DIAGNOSIS — E119 Type 2 diabetes mellitus without complications: Secondary | ICD-10-CM | POA: Diagnosis not present

## 2016-02-13 DIAGNOSIS — G8929 Other chronic pain: Secondary | ICD-10-CM | POA: Diagnosis not present

## 2016-02-13 DIAGNOSIS — M545 Low back pain: Secondary | ICD-10-CM | POA: Diagnosis not present

## 2016-02-13 DIAGNOSIS — Z794 Long term (current) use of insulin: Secondary | ICD-10-CM | POA: Diagnosis not present

## 2016-02-13 DIAGNOSIS — I10 Essential (primary) hypertension: Secondary | ICD-10-CM | POA: Diagnosis not present

## 2016-02-13 DIAGNOSIS — G894 Chronic pain syndrome: Secondary | ICD-10-CM | POA: Diagnosis not present

## 2016-02-13 DIAGNOSIS — Z0000 Encounter for general adult medical examination without abnormal findings: Secondary | ICD-10-CM | POA: Diagnosis not present

## 2016-02-21 DIAGNOSIS — C44519 Basal cell carcinoma of skin of other part of trunk: Secondary | ICD-10-CM | POA: Diagnosis not present

## 2016-03-06 DIAGNOSIS — D0462 Carcinoma in situ of skin of left upper limb, including shoulder: Secondary | ICD-10-CM | POA: Diagnosis not present

## 2016-03-07 DIAGNOSIS — M8588 Other specified disorders of bone density and structure, other site: Secondary | ICD-10-CM | POA: Diagnosis not present

## 2016-03-07 DIAGNOSIS — Z13820 Encounter for screening for osteoporosis: Secondary | ICD-10-CM | POA: Diagnosis not present

## 2016-05-04 DIAGNOSIS — L570 Actinic keratosis: Secondary | ICD-10-CM | POA: Diagnosis not present

## 2016-05-04 DIAGNOSIS — D2271 Melanocytic nevi of right lower limb, including hip: Secondary | ICD-10-CM | POA: Diagnosis not present

## 2016-05-04 DIAGNOSIS — Z85828 Personal history of other malignant neoplasm of skin: Secondary | ICD-10-CM | POA: Diagnosis not present

## 2016-05-04 DIAGNOSIS — X32XXXA Exposure to sunlight, initial encounter: Secondary | ICD-10-CM | POA: Diagnosis not present

## 2016-05-04 DIAGNOSIS — D225 Melanocytic nevi of trunk: Secondary | ICD-10-CM | POA: Diagnosis not present

## 2016-05-04 DIAGNOSIS — L821 Other seborrheic keratosis: Secondary | ICD-10-CM | POA: Diagnosis not present

## 2016-05-17 DIAGNOSIS — I75023 Atheroembolism of bilateral lower extremities: Secondary | ICD-10-CM | POA: Diagnosis not present

## 2016-05-17 DIAGNOSIS — I10 Essential (primary) hypertension: Secondary | ICD-10-CM | POA: Diagnosis not present

## 2016-05-17 DIAGNOSIS — F329 Major depressive disorder, single episode, unspecified: Secondary | ICD-10-CM | POA: Diagnosis not present

## 2016-05-17 DIAGNOSIS — F5105 Insomnia due to other mental disorder: Secondary | ICD-10-CM | POA: Diagnosis not present

## 2016-05-17 DIAGNOSIS — Z794 Long term (current) use of insulin: Secondary | ICD-10-CM | POA: Diagnosis not present

## 2016-05-17 DIAGNOSIS — E119 Type 2 diabetes mellitus without complications: Secondary | ICD-10-CM | POA: Diagnosis not present

## 2016-05-17 DIAGNOSIS — G894 Chronic pain syndrome: Secondary | ICD-10-CM | POA: Diagnosis not present

## 2016-05-17 DIAGNOSIS — F419 Anxiety disorder, unspecified: Secondary | ICD-10-CM | POA: Diagnosis not present

## 2016-06-11 DIAGNOSIS — E119 Type 2 diabetes mellitus without complications: Secondary | ICD-10-CM | POA: Diagnosis not present

## 2016-06-11 DIAGNOSIS — M25471 Effusion, right ankle: Secondary | ICD-10-CM | POA: Diagnosis not present

## 2016-06-11 DIAGNOSIS — Z794 Long term (current) use of insulin: Secondary | ICD-10-CM | POA: Diagnosis not present

## 2016-06-11 DIAGNOSIS — F419 Anxiety disorder, unspecified: Secondary | ICD-10-CM | POA: Diagnosis not present

## 2016-06-11 DIAGNOSIS — G894 Chronic pain syndrome: Secondary | ICD-10-CM | POA: Diagnosis not present

## 2016-06-11 DIAGNOSIS — Z1231 Encounter for screening mammogram for malignant neoplasm of breast: Secondary | ICD-10-CM | POA: Diagnosis not present

## 2016-06-11 DIAGNOSIS — I10 Essential (primary) hypertension: Secondary | ICD-10-CM | POA: Diagnosis not present

## 2016-06-25 ENCOUNTER — Other Ambulatory Visit: Payer: Self-pay | Admitting: Internal Medicine

## 2016-06-25 DIAGNOSIS — Z1231 Encounter for screening mammogram for malignant neoplasm of breast: Secondary | ICD-10-CM

## 2016-07-20 DIAGNOSIS — E119 Type 2 diabetes mellitus without complications: Secondary | ICD-10-CM | POA: Diagnosis not present

## 2016-07-24 ENCOUNTER — Ambulatory Visit
Admission: RE | Admit: 2016-07-24 | Discharge: 2016-07-24 | Disposition: A | Discharge status: Home / Self Care | Payer: Commercial Managed Care - HMO | Source: Ambulatory Visit | Attending: Internal Medicine | Admitting: Internal Medicine

## 2016-07-24 ENCOUNTER — Other Ambulatory Visit: Payer: Self-pay | Admitting: Internal Medicine

## 2016-07-24 DIAGNOSIS — Z1231 Encounter for screening mammogram for malignant neoplasm of breast: Secondary | ICD-10-CM

## 2016-09-04 DIAGNOSIS — Z794 Long term (current) use of insulin: Secondary | ICD-10-CM | POA: Diagnosis not present

## 2016-09-04 DIAGNOSIS — G894 Chronic pain syndrome: Secondary | ICD-10-CM | POA: Diagnosis not present

## 2016-09-04 DIAGNOSIS — M25471 Effusion, right ankle: Secondary | ICD-10-CM | POA: Diagnosis not present

## 2016-09-04 DIAGNOSIS — F419 Anxiety disorder, unspecified: Secondary | ICD-10-CM | POA: Diagnosis not present

## 2016-09-04 DIAGNOSIS — I10 Essential (primary) hypertension: Secondary | ICD-10-CM | POA: Diagnosis not present

## 2016-09-04 DIAGNOSIS — E119 Type 2 diabetes mellitus without complications: Secondary | ICD-10-CM | POA: Diagnosis not present

## 2016-09-04 DIAGNOSIS — Z1231 Encounter for screening mammogram for malignant neoplasm of breast: Secondary | ICD-10-CM | POA: Diagnosis not present

## 2016-09-04 DIAGNOSIS — R8290 Unspecified abnormal findings in urine: Secondary | ICD-10-CM | POA: Diagnosis not present

## 2016-09-28 ENCOUNTER — Emergency Department: Payer: Medicare HMO

## 2016-09-28 ENCOUNTER — Inpatient Hospital Stay
Admission: EM | Admit: 2016-09-28 | Discharge: 2016-10-05 | DRG: 683 | Disposition: A | Discharge status: Skilled Nursing Facility | Payer: Medicare HMO | Attending: Internal Medicine | Admitting: Internal Medicine

## 2016-09-28 DIAGNOSIS — G8929 Other chronic pain: Secondary | ICD-10-CM | POA: Diagnosis not present

## 2016-09-28 DIAGNOSIS — E860 Dehydration: Secondary | ICD-10-CM | POA: Diagnosis present

## 2016-09-28 DIAGNOSIS — R9431 Abnormal electrocardiogram [ECG] [EKG]: Secondary | ICD-10-CM | POA: Diagnosis not present

## 2016-09-28 DIAGNOSIS — Z79899 Other long term (current) drug therapy: Secondary | ICD-10-CM | POA: Diagnosis not present

## 2016-09-28 DIAGNOSIS — R112 Nausea with vomiting, unspecified: Secondary | ICD-10-CM | POA: Diagnosis not present

## 2016-09-28 DIAGNOSIS — Z885 Allergy status to narcotic agent status: Secondary | ICD-10-CM

## 2016-09-28 DIAGNOSIS — Z888 Allergy status to other drugs, medicaments and biological substances status: Secondary | ICD-10-CM

## 2016-09-28 DIAGNOSIS — Z9841 Cataract extraction status, right eye: Secondary | ICD-10-CM | POA: Diagnosis not present

## 2016-09-28 DIAGNOSIS — Z8249 Family history of ischemic heart disease and other diseases of the circulatory system: Secondary | ICD-10-CM

## 2016-09-28 DIAGNOSIS — Z833 Family history of diabetes mellitus: Secondary | ICD-10-CM | POA: Diagnosis not present

## 2016-09-28 DIAGNOSIS — D509 Iron deficiency anemia, unspecified: Secondary | ICD-10-CM | POA: Diagnosis present

## 2016-09-28 DIAGNOSIS — F319 Bipolar disorder, unspecified: Secondary | ICD-10-CM | POA: Diagnosis present

## 2016-09-28 DIAGNOSIS — R0602 Shortness of breath: Secondary | ICD-10-CM

## 2016-09-28 DIAGNOSIS — M6281 Muscle weakness (generalized): Secondary | ICD-10-CM | POA: Diagnosis not present

## 2016-09-28 DIAGNOSIS — F3289 Other specified depressive episodes: Secondary | ICD-10-CM | POA: Diagnosis not present

## 2016-09-28 DIAGNOSIS — N179 Acute kidney failure, unspecified: Principal | ICD-10-CM | POA: Diagnosis present

## 2016-09-28 DIAGNOSIS — I10 Essential (primary) hypertension: Secondary | ICD-10-CM | POA: Diagnosis not present

## 2016-09-28 DIAGNOSIS — Z9842 Cataract extraction status, left eye: Secondary | ICD-10-CM | POA: Diagnosis not present

## 2016-09-28 DIAGNOSIS — R1111 Vomiting without nausea: Secondary | ICD-10-CM | POA: Diagnosis not present

## 2016-09-28 DIAGNOSIS — J690 Pneumonitis due to inhalation of food and vomit: Secondary | ICD-10-CM | POA: Diagnosis not present

## 2016-09-28 DIAGNOSIS — Z5189 Encounter for other specified aftercare: Secondary | ICD-10-CM | POA: Diagnosis not present

## 2016-09-28 DIAGNOSIS — N3000 Acute cystitis without hematuria: Secondary | ICD-10-CM | POA: Diagnosis not present

## 2016-09-28 DIAGNOSIS — R0902 Hypoxemia: Secondary | ICD-10-CM | POA: Diagnosis not present

## 2016-09-28 DIAGNOSIS — R8290 Unspecified abnormal findings in urine: Secondary | ICD-10-CM | POA: Diagnosis not present

## 2016-09-28 DIAGNOSIS — Z794 Long term (current) use of insulin: Secondary | ICD-10-CM | POA: Diagnosis not present

## 2016-09-28 DIAGNOSIS — E271 Primary adrenocortical insufficiency: Secondary | ICD-10-CM | POA: Diagnosis not present

## 2016-09-28 DIAGNOSIS — Z859 Personal history of malignant neoplasm, unspecified: Secondary | ICD-10-CM

## 2016-09-28 DIAGNOSIS — R918 Other nonspecific abnormal finding of lung field: Secondary | ICD-10-CM | POA: Diagnosis not present

## 2016-09-28 DIAGNOSIS — R059 Cough, unspecified: Secondary | ICD-10-CM

## 2016-09-28 DIAGNOSIS — N390 Urinary tract infection, site not specified: Secondary | ICD-10-CM | POA: Diagnosis not present

## 2016-09-28 DIAGNOSIS — M549 Dorsalgia, unspecified: Secondary | ICD-10-CM | POA: Diagnosis present

## 2016-09-28 DIAGNOSIS — E8770 Fluid overload, unspecified: Secondary | ICD-10-CM | POA: Diagnosis not present

## 2016-09-28 DIAGNOSIS — K219 Gastro-esophageal reflux disease without esophagitis: Secondary | ICD-10-CM | POA: Diagnosis present

## 2016-09-28 DIAGNOSIS — E785 Hyperlipidemia, unspecified: Secondary | ICD-10-CM | POA: Diagnosis not present

## 2016-09-28 DIAGNOSIS — N12 Tubulo-interstitial nephritis, not specified as acute or chronic: Secondary | ICD-10-CM | POA: Diagnosis not present

## 2016-09-28 DIAGNOSIS — K529 Noninfective gastroenteritis and colitis, unspecified: Secondary | ICD-10-CM | POA: Diagnosis present

## 2016-09-28 DIAGNOSIS — R05 Cough: Secondary | ICD-10-CM

## 2016-09-28 DIAGNOSIS — Z961 Presence of intraocular lens: Secondary | ICD-10-CM | POA: Diagnosis present

## 2016-09-28 DIAGNOSIS — E119 Type 2 diabetes mellitus without complications: Secondary | ICD-10-CM | POA: Diagnosis present

## 2016-09-28 DIAGNOSIS — R1312 Dysphagia, oropharyngeal phase: Secondary | ICD-10-CM | POA: Diagnosis not present

## 2016-09-28 DIAGNOSIS — Z9911 Dependence on respirator [ventilator] status: Secondary | ICD-10-CM | POA: Diagnosis not present

## 2016-09-28 DIAGNOSIS — E1140 Type 2 diabetes mellitus with diabetic neuropathy, unspecified: Secondary | ICD-10-CM | POA: Diagnosis not present

## 2016-09-28 DIAGNOSIS — R197 Diarrhea, unspecified: Secondary | ICD-10-CM

## 2016-09-28 DIAGNOSIS — R0600 Dyspnea, unspecified: Secondary | ICD-10-CM | POA: Diagnosis not present

## 2016-09-28 DIAGNOSIS — K5730 Diverticulosis of large intestine without perforation or abscess without bleeding: Secondary | ICD-10-CM | POA: Diagnosis not present

## 2016-09-28 DIAGNOSIS — Z96642 Presence of left artificial hip joint: Secondary | ICD-10-CM | POA: Diagnosis present

## 2016-09-28 DIAGNOSIS — F3176 Bipolar disorder, in full remission, most recent episode depressed: Secondary | ICD-10-CM | POA: Diagnosis not present

## 2016-09-28 LAB — GASTROINTESTINAL PANEL BY PCR, STOOL (REPLACES STOOL CULTURE)

## 2016-09-28 LAB — CBC
HEMATOCRIT: 38 % (ref 35.0–47.0)
Hemoglobin: 12.1 g/dL (ref 12.0–16.0)
MCH: 25.4 pg — ABNORMAL LOW (ref 26.0–34.0)
MCHC: 31.9 g/dL — AB (ref 32.0–36.0)
MCV: 79.7 fL — AB (ref 80.0–100.0)
Platelets: 378 10*3/uL (ref 150–440)
RBC: 4.77 MIL/uL (ref 3.80–5.20)
RDW: 19.4 % — AB (ref 11.5–14.5)
WBC: 6.5 10*3/uL (ref 3.6–11.0)

## 2016-09-28 LAB — COMPREHENSIVE METABOLIC PANEL
ALBUMIN: 3.2 g/dL — AB (ref 3.5–5.0)
ALT: 22 U/L (ref 14–54)
AST: 30 U/L (ref 15–41)
Alkaline Phosphatase: 83 U/L (ref 38–126)
Anion gap: 13 (ref 5–15)
BUN: 56 mg/dL — AB (ref 6–20)
CHLORIDE: 98 mmol/L — AB (ref 101–111)
CO2: 24 mmol/L (ref 22–32)
Calcium: 9.3 mg/dL (ref 8.9–10.3)
Creatinine, Ser: 2.75 mg/dL — ABNORMAL HIGH (ref 0.44–1.00)
GFR calc Af Amer: 18 mL/min — ABNORMAL LOW (ref >60)
GFR calc non Af Amer: 16 mL/min — ABNORMAL LOW (ref >60)
GLUCOSE: 318 mg/dL — AB (ref 65–99)
POTASSIUM: 3.3 mmol/L — AB (ref 3.5–5.1)
SODIUM: 135 mmol/L (ref 135–145)
Total Bilirubin: 0.6 mg/dL (ref 0.3–1.2)
Total Protein: 6.9 g/dL (ref 6.5–8.1)

## 2016-09-28 LAB — URINALYSIS, COMPLETE (UACMP) WITH MICROSCOPIC
Bilirubin Urine: NEGATIVE
GLUCOSE, UA: 50 mg/dL — AB
Ketones, ur: NEGATIVE mg/dL
Nitrite: NEGATIVE
PH: 5 (ref 5.0–8.0)
Protein, ur: 100 mg/dL — AB
SPECIFIC GRAVITY, URINE: 1.014 (ref 1.005–1.030)
SQUAMOUS EPITHELIAL / LPF: NONE SEEN

## 2016-09-28 LAB — C DIFFICILE QUICK SCREEN W PCR REFLEX
C Diff antigen: NEGATIVE
C Diff interpretation: NOT DETECTED
C Diff toxin: NEGATIVE

## 2016-09-28 LAB — GLUCOSE, CAPILLARY
GLUCOSE-CAPILLARY: 216 mg/dL — AB (ref 65–99)
Glucose-Capillary: 215 mg/dL — ABNORMAL HIGH (ref 65–99)

## 2016-09-28 LAB — LIPASE, BLOOD: LIPASE: 46 U/L (ref 11–51)

## 2016-09-28 MED ORDER — ONDANSETRON HCL 4 MG PO TABS
4.0000 mg | ORAL_TABLET | Freq: Four times a day (QID) | ORAL | Status: DC | PRN
  Administered 2016-09-28 – 2016-09-29 (×3): 4 mg via ORAL
  Filled 2016-09-28 (×3): qty 1

## 2016-09-28 MED ORDER — HYDROCORTISONE 10 MG PO TABS
20.0000 mg | ORAL_TABLET | Freq: Every morning | ORAL | Status: DC
  Administered 2016-09-29: 20 mg via ORAL
  Filled 2016-09-28: qty 2

## 2016-09-28 MED ORDER — AMLODIPINE BESYLATE 10 MG PO TABS
10.0000 mg | ORAL_TABLET | Freq: Every day | ORAL | Status: DC
  Administered 2016-09-28 – 2016-10-02 (×4): 10 mg via ORAL
  Filled 2016-09-28 (×5): qty 1

## 2016-09-28 MED ORDER — PRAVASTATIN SODIUM 20 MG PO TABS
40.0000 mg | ORAL_TABLET | Freq: Every day | ORAL | Status: DC
  Administered 2016-09-28 – 2016-10-04 (×7): 40 mg via ORAL
  Filled 2016-09-28 (×7): qty 2

## 2016-09-28 MED ORDER — SODIUM CHLORIDE 0.9 % IV BOLUS (SEPSIS): 1000.0000 mL | Freq: Once | INTRAVENOUS | Status: AC

## 2016-09-28 MED ORDER — METOPROLOL TARTRATE 50 MG PO TABS
50.0000 mg | ORAL_TABLET | Freq: Two times a day (BID) | ORAL | Status: DC
  Administered 2016-09-28 – 2016-10-02 (×5): 50 mg via ORAL
  Filled 2016-09-28 (×6): qty 1

## 2016-09-28 MED ORDER — SODIUM CHLORIDE 0.9 % IV BOLUS (SEPSIS)
1000.0000 mL | Freq: Once | INTRAVENOUS | Status: AC
  Administered 2016-09-28: 1000 mL via INTRAVENOUS

## 2016-09-28 MED ORDER — DULOXETINE HCL 30 MG PO CPEP
60.0000 mg | ORAL_CAPSULE | Freq: Every day | ORAL | Status: DC
  Administered 2016-09-28 – 2016-10-05 (×8): 60 mg via ORAL
  Filled 2016-09-28 (×8): qty 2

## 2016-09-28 MED ORDER — ENOXAPARIN SODIUM 30 MG/0.3ML ~~LOC~~ SOLN
30.0000 mg | SUBCUTANEOUS | Status: DC
  Administered 2016-09-28 – 2016-10-02 (×5): 30 mg via SUBCUTANEOUS
  Filled 2016-09-28 (×5): qty 0.3

## 2016-09-28 MED ORDER — HYDROCORTISONE 10 MG PO TABS
10.0000 mg | ORAL_TABLET | Freq: Every day | ORAL | Status: DC
  Administered 2016-09-28: 10 mg via ORAL
  Filled 2016-09-28: qty 1

## 2016-09-28 MED ORDER — METRONIDAZOLE IN NACL 5-0.79 MG/ML-% IV SOLN
500.0000 mg | Freq: Three times a day (TID) | INTRAVENOUS | Status: DC
  Administered 2016-09-28 – 2016-09-30 (×6): 500 mg via INTRAVENOUS
  Filled 2016-09-28 (×8): qty 100

## 2016-09-28 MED ORDER — ONDANSETRON HCL 4 MG/2ML IJ SOLN: 4.0000 mg | Freq: Four times a day (QID) | INTRAMUSCULAR | Status: DC | PRN

## 2016-09-28 MED ORDER — IOPAMIDOL (ISOVUE-300) INJECTION 61%
30.0000 mL | Freq: Once | INTRAVENOUS | Status: AC | PRN
  Administered 2016-09-28: 30 mL via ORAL

## 2016-09-28 MED ORDER — INSULIN ASPART 100 UNIT/ML ~~LOC~~ SOLN
0.0000 [IU] | Freq: Three times a day (TID) | SUBCUTANEOUS | Status: DC
  Administered 2016-09-29 – 2016-09-30 (×5): 2 [IU] via SUBCUTANEOUS
  Administered 2016-09-30: 08:00:00 3 [IU] via SUBCUTANEOUS
  Administered 2016-10-01 (×2): 2 [IU] via SUBCUTANEOUS
  Administered 2016-10-01: 3 [IU] via SUBCUTANEOUS
  Administered 2016-10-02 (×2): 2 [IU] via SUBCUTANEOUS
  Administered 2016-10-02: 13:00:00 3 [IU] via SUBCUTANEOUS
  Administered 2016-10-03 – 2016-10-04 (×4): 2 [IU] via SUBCUTANEOUS
  Administered 2016-10-04 – 2016-10-05 (×2): 1 [IU] via SUBCUTANEOUS
  Administered 2016-10-05: 12:00:00 2 [IU] via SUBCUTANEOUS
  Filled 2016-09-28 (×20): qty 1

## 2016-09-28 MED ORDER — NYSTATIN 100000 UNIT/GM EX POWD
Freq: Two times a day (BID) | CUTANEOUS | Status: DC
  Administered 2016-09-28 – 2016-10-05 (×14): via TOPICAL
  Filled 2016-09-28: qty 15

## 2016-09-28 MED ORDER — INSULIN ASPART 100 UNIT/ML ~~LOC~~ SOLN
0.0000 [IU] | Freq: Every day | SUBCUTANEOUS | Status: DC
  Administered 2016-09-28 – 2016-09-29 (×2): 2 [IU] via SUBCUTANEOUS
  Administered 2016-09-29: 3 [IU] via SUBCUTANEOUS
  Filled 2016-09-28 (×2): qty 1

## 2016-09-28 MED ORDER — PANTOPRAZOLE SODIUM 40 MG PO TBEC
40.0000 mg | DELAYED_RELEASE_TABLET | Freq: Every day | ORAL | Status: DC
  Administered 2016-09-28 – 2016-10-05 (×8): 40 mg via ORAL
  Filled 2016-09-28 (×8): qty 1

## 2016-09-28 MED ORDER — ACETAMINOPHEN 325 MG PO TABS: 650.0000 mg | ORAL_TABLET | Freq: Four times a day (QID) | ORAL | Status: DC | PRN

## 2016-09-28 MED ORDER — SODIUM CHLORIDE 0.9 % IV SOLN
INTRAVENOUS | Status: DC
  Administered 2016-09-28 – 2016-09-29 (×2): via INTRAVENOUS

## 2016-09-28 MED ORDER — TRAMADOL HCL 50 MG PO TABS
50.0000 mg | ORAL_TABLET | Freq: Four times a day (QID) | ORAL | Status: DC | PRN
  Administered 2016-09-28 – 2016-10-03 (×10): 50 mg via ORAL
  Filled 2016-09-28 (×10): qty 1

## 2016-09-28 MED ORDER — MIRTAZAPINE 15 MG PO TABS
15.0000 mg | ORAL_TABLET | Freq: Every day | ORAL | Status: DC
  Administered 2016-09-28 – 2016-10-04 (×6): 15 mg via ORAL
  Filled 2016-09-28 (×5): qty 1

## 2016-09-28 MED ORDER — FLUDROCORTISONE ACETATE 0.1 MG PO TABS
0.1000 mg | ORAL_TABLET | Freq: Every day | ORAL | Status: DC
  Administered 2016-09-28 – 2016-09-29 (×2): 0.1 mg via ORAL
  Filled 2016-09-28 (×2): qty 1

## 2016-09-28 MED ORDER — DEXTROSE 5 % IV SOLN
1.0000 g | INTRAVENOUS | Status: DC
  Administered 2016-09-29 – 2016-09-30 (×2): 1 g via INTRAVENOUS
  Filled 2016-09-28 (×3): qty 10

## 2016-09-28 MED ORDER — FLUCONAZOLE 100 MG PO TABS
200.0000 mg | ORAL_TABLET | Freq: Every day | ORAL | Status: DC
  Administered 2016-09-28 – 2016-10-01 (×4): 200 mg via ORAL
  Filled 2016-09-28 (×3): qty 2
  Filled 2016-09-28: qty 1

## 2016-09-28 MED ORDER — CEFTRIAXONE SODIUM IN DEXTROSE 20 MG/ML IV SOLN
1.0000 g | Freq: Once | INTRAVENOUS | Status: AC
  Administered 2016-09-28: 1 g via INTRAVENOUS
  Filled 2016-09-28: qty 50

## 2016-09-28 NOTE — ED Triage Notes (Signed)
Pt arrives to ED via [facility] from home where they report pt has been lying on feces covered cough x 1 week at least. Pt states 4 weeks. Pt arrives covered in feces. Pt states she is unable to control her bowels. Pt c/o chest pain and back pain. Pt states she is coughing up green sputum. Pt states "my butt burns like fire." pt finished antibiotics 2 weeks ago for kidney infection.   CBG 336, tachy 115, 98% RA.   EMS reports that the [employer] is filing a report for pt because of living conditions, pt lives alone.

## 2016-09-28 NOTE — ED Notes (Signed)
Nurse unable to take report  

## 2016-09-28 NOTE — ED Notes (Signed)
Resumed care from [place] rn.  Pt alert and finished contrast  Ct aware.

## 2016-09-28 NOTE — ED Notes (Signed)
Cleaned pt up for 3rd time since being in ED. Changed pt linen, brief, and chuck d/t amount of stool. Replaced cream on bottom.

## 2016-09-28 NOTE — ED Provider Notes (Addendum)
[facility] Emergency Department Provider Note  ____________________________________________  Time seen: Approximately 2:40 PM  I have reviewed the triage vital signs and the nursing notes.   HISTORY  Chief Complaint Diarrhea  Level 5 Caveat: Portions of the History and Physical are unable to be obtained due to patient being a poor historian   HPI Chandrea Zellman is a 76 y.o. female who comes to the ED complaining of vomiting diarrhea and productive cough for the past week. She's recently been on antibiotics for urinary tract infection, ran out about a week ago, and then the symptoms started. Also has generalized abdominal pain. Denies urinary symptoms. Denies headache neck pain or vision changes numbness tingling or weakness. Symptoms are intermittent, worse with eating, no alleviating factors. Moderate intensity.     Past Medical History:  Diagnosis Date  . Addison's disease ([place])   . Ankle fracture, left 02/2012  . Arthritis    "all over my body" (04/10/2012)  . Bipolar depression ([place])   . Cancer ([place])   . Chronic back pain    "since my hip OR in 09/2010"  . Depression   . DVT, lower extremity ([place]) 09/2010   "left; after hip replacement" (04/10/2012)  . Fall at home 02/2012   'foot got caught on heating pad cord; broke left ankle" (04/10/2012)  . GERD (gastroesophageal reflux disease)   . History of blood transfusion   . Hypercholesteremia   . Hypertension   . Syncope and collapse 04/10/2012   "w/loss of consciousness" (04/10/2012)  . Type II diabetes mellitus [facility])      Patient Active Problem List   Diagnosis Date Noted  . Pain management 11/14/2015  . Syncope 11/07/2015  . Colitis 10/30/2015  . Sepsis ([place]) 10/30/2015  . Type II or unspecified type diabetes mellitus without mention of complication, not stated as uncontrolled 04/10/2012  . Unspecified essential hypertension 04/10/2012  . Bipolar I disorder, most recent episode (or current)  unspecified 04/10/2012  . Addison's disease ([place]) 04/10/2012  . Chronic kidney disease 04/10/2012  . SAH (subarachnoid hemorrhage) ([place]) 04/10/2012     Past Surgical History:  Procedure Laterality Date  . APPENDECTOMY  ~ 1949  . AUGMENTATION MAMMAPLASTY Bilateral 1970  . CATARACT EXTRACTION W/ INTRAOCULAR LENS  IMPLANT, BILATERAL Bilateral ~ 2010  . COLOSTOMY  ~ 2010  . COLOSTOMY REVERSAL  ~ 2010   "wore a bag for ~ 6 months" (04/10/2012)  . DILATION AND CURETTAGE OF UTERUS  ~ 1964   "after a miscarriage" (04/10/2012)  . JOINT REPLACEMENT    . NASAL HEMORRHAGE CONTROL  ~ 2009   "had to have blood transfusion" (04/10/2012)  . TONSILLECTOMY  ~ 1949  . TOTAL HIP ARTHROPLASTY Left 09/2010  . VAGINAL HYSTERECTOMY  1970's?     Prior to Admission medications   Medication Sig Start Date End Date Taking? Authorizing Provider  amLODipine (NORVASC) 10 MG tablet Take 10 mg by mouth daily.    [provider]  docusate sodium (COLACE) 100 MG capsule Take 1 capsule (100 mg total) by mouth 2 (two) times daily. Patient taking differently: Take 100 mg by mouth 2 (two) times daily as needed for moderate constipation.  11/16/14   Gladstone Lighter, MD  DULoxetine (CYMBALTA) 60 MG capsule Take 60 mg by mouth daily.    [provider]  fentaNYL (DURAGESIC - DOSED MCG/HR) 12 MCG/HR Place 3 patches (37.5 mcg total) onto the skin every 3 (three) days. 11/17/15   Gladstone Lighter, MD  fludrocortisone (FLORINEF) 0.1 MG tablet Take 0.1 mg by mouth daily.    [provider]  hydrocortisone (CORTEF) 10 MG tablet Take 10-20 mg by mouth 2 (two) times daily. 20 mg every morning and 10 mg at bedtime    [provider]  HYDROmorphone (DILAUDID) 2 MG tablet Take 1 tablet (2 mg total) by mouth every 6 (six) hours as needed for severe pain. 11/16/15   Gladstone Lighter, MD  insulin aspart (NOVOLOG) 100 UNIT/ML injection Inject into the skin 3 (three) times daily before meals. Per sliding  scale 8 units am,10 units noon, 12 units pm    [provider]  insulin glargine (LANTUS) 100 unit/mL SOPN Inject 10 Units into the skin at bedtime.    [provider]  lovastatin (MEVACOR) 40 MG tablet Take 40 mg by mouth at bedtime. 08/20/14   [provider]  metaxalone (SKELAXIN) 400 MG tablet Take 1 tablet (400 mg total) by mouth 3 (three) times daily. X 10 days Patient not taking: Reported on 11/14/2015 11/11/15   Gladstone Lighter, MD  metoprolol (LOPRESSOR) 50 MG tablet Take 1 tablet (50 mg total) by mouth 2 (two) times daily. Patient not taking: Reported on 11/14/2015 11/11/15   Gladstone Lighter, MD  omeprazole (PRILOSEC) 40 MG capsule Take 40 mg by mouth 2 (two) times daily.    [provider]  polyethylene glycol (MIRALAX / GLYCOLAX) packet Take 17 g by mouth daily as needed for mild constipation. 11/11/15   Gladstone Lighter, MD  senna (SENOKOT) 8.6 MG TABS tablet Take 1 tablet (8.6 mg total) by mouth 2 (two) times daily. 11/16/15   Gladstone Lighter, MD  TRULICITY 5.46 EV/0.3JK SOPN Inject 0.5 mLs into the skin every Wednesday.    [provider]     Allergies Codeine; Prednisone; Promethazine; and Lidoderm [lidocaine]   Family History  Problem Relation Age of Onset  . Hypertension Mother   . Diabetes Mellitus II Mother   . Heart attack Mother   . Lung cancer Father     Social History Social History  Substance Use Topics  . Smoking status: Never Smoker  . Smokeless tobacco: Never Used  . Alcohol use No    Review of Systems  Constitutional:   No fever or chills.  ENT:   No sore throat. No rhinorrhea. Cardiovascular:   Positive generalized chest pain without syncope. Respiratory:   Positive shortness of breath and productive cough. Gastrointestinal:   Positive generalized abdominal pain with vomiting and diarrhea.  Musculoskeletal:   Negative for focal pain or swelling All other systems reviewed and are negative except as  documented above in ROS and HPI.  ____________________________________________   PHYSICAL EXAM:  VITAL SIGNS: ED Triage Vitals [09/28/16 1330]  Enc Vitals Group     BP 113/77     Pulse Rate (!) 112     Resp 20     Temp 98.1 F (36.7 C)     Temp Source Oral     SpO2 99 %     Weight      Height      Head Circumference      Peak Flow      Pain Score 8     Pain Loc      Pain Edu?      Excl. in [place]?     Vital signs reviewed, nursing assessments reviewed.   Constitutional:   Alert and oriented. Ill-appearing, not in distress. Smells of urine Eyes:   No scleral  icterus.  EOMI. No nystagmus. No conjunctival pallor. PERRL. ENT   Head:   Normocephalic and atraumatic.   Nose:   No congestion/rhinnorhea.    Mouth/Throat:   Dry mucous membranes, no pharyngeal erythema. No peritonsillar mass.    Neck:   No meningismus. Full ROM Hematological/Lymphatic/Immunilogical:   No cervical lymphadenopathy. Cardiovascular:   Tachycardia heart rate 110. Symmetric bilateral radial and DP pulses.  No murmurs.  Respiratory:   Normal respiratory effort without tachypnea/retractions. Breath sounds are clear and equal bilaterally. No wheezes/rales/rhonchi. Gastrointestinal:   Soft with suprapubic tenderness. Non distended. There is no CVA tenderness.  No rebound, rigidity, or guarding. Genitourinary:   deferred Musculoskeletal:   Normal range of motion in all extremities. No joint effusions.  No lower extremity tenderness.  No edema. Neurologic:   Normal speech and language.  Motor grossly intact. No gross focal neurologic deficits are appreciated.  Skin:    Skin is warm, dry and intact. No rash noted.  No petechiae, purpura, or bullae.  ____________________________________________    LABS (pertinent positives/negatives) (all labs ordered are listed, but only abnormal results are displayed) Labs Reviewed  COMPREHENSIVE METABOLIC PANEL - Abnormal; Notable for the following:        Result Value   Potassium 3.3 (*)    Chloride 98 (*)    Glucose, Bld 318 (*)    BUN 56 (*)    Creatinine, Ser 2.75 (*)    Albumin 3.2 (*)    GFR calc non Af Amer 16 (*)    GFR calc Af Amer 18 (*)    All other components within normal limits  CBC - Abnormal; Notable for the following:    MCV 79.7 (*)    MCH 25.4 (*)    MCHC 31.9 (*)    RDW 19.4 (*)    All other components within normal limits  URINALYSIS, COMPLETE (UACMP) WITH MICROSCOPIC - Abnormal; Notable for the following:    Color, Urine YELLOW (*)    APPearance TURBID (*)    Glucose, UA 50 (*)    Hgb urine dipstick SMALL (*)    Protein, ur 100 (*)    Leukocytes, UA MODERATE (*)    Bacteria, UA MANY (*)    All other components within normal limits  GASTROINTESTINAL PANEL BY PCR, STOOL (REPLACES STOOL CULTURE)  C DIFFICILE QUICK SCREEN W PCR REFLEX  URINE CULTURE  LIPASE, BLOOD   ____________________________________________   EKG Interpreted by me Sinus tachycardia rate 114, left axis, normal intervals. Normal QRS ST segments and T waves.   ____________________________________________    RADIOLOGY  No results found.  ____________________________________________   PROCEDURES Procedures  ____________________________________________   INITIAL IMPRESSION / ASSESSMENT AND PLAN / ED COURSE  Pertinent labs & imaging results that were available during my care of the patient were reviewed by me and considered in my medical decision making (see chart for details).  Patient presents to tachycardia, diarrhea, recent antibiotics with treatment of a urinary tract infection which she states discontinued 1 week ago. Tachycardia I think is due to dehydration, other vital signs are unremarkable, white blood cell count is unremarkable, presentation is consistent with urinary tract infection and pyelonephritis with acute renal insufficiency. We'll give IV fluids for hydration, IV ceftriaxone, plan to admit for further  management. Low suspicion of pneumonia soft tissue infection meningitis encephalitis endocarditis.      ____________________________________________   FINAL CLINICAL IMPRESSION(S) / ED DIAGNOSES  Final diagnoses:  Pyelonephritis  Nausea vomiting and diarrhea  Acute  renal insufficiency    New Prescriptions   No medications on file     Portions of this note were generated with dragon dictation software. Dictation errors may occur despite best attempts at proofreading.    Carrie Mew, MD 09/28/16 [place]    Carrie Mew, MD 09/28/16 1515

## 2016-09-28 NOTE — H&P (Signed)
[place] [facility] [place] NAME: Jordan Brewer    MR#:  [id number]  DATE OF BIRTH:  March 22, 1940  DATE OF ADMISSION:  09/28/2016  PRIMARY CARE PHYSICIAN: Tracie Harrier, MD   REQUESTING/REFERRING PHYSICIAN:  [place]  CHIEF COMPLAINT:  diarrhea  HISTORY OF PRESENT ILLNESS:  Jordan Brewer  is a 76 y.o. female with a known history of Addison's disease on Florinef, hypertension, hyperlipidemia,, diabetes medicines and other medical problems is presenting to the ED with a chief complaint of diarrhea as well as nausea and vomiting. Patient has been vomiting and having diarrhea for 1 week. She just finished antibiotics for UTI 1 week ago. Reporting diffuse abdominal pain. During my examination patient denies any vomiting or nausea but reporting diarrhea. Patient's creatinine has gotten worse from 0.9-2.8  PAST MEDICAL HISTORY:   Past Medical History:  Diagnosis Date  . Addison's disease ([place])   . Ankle fracture, left 02/2012  . Arthritis    "all over my body" (04/10/2012)  . Bipolar depression ([place])   . Cancer ([place])   . Chronic back pain    "since my hip OR in 09/2010"  . Depression   . DVT, lower extremity ([place]) 09/2010   "left; after hip replacement" (04/10/2012)  . Fall at home 02/2012   'foot got caught on heating pad cord; broke left ankle" (04/10/2012)  . GERD (gastroesophageal reflux disease)   . History of blood transfusion   . Hypercholesteremia   . Hypertension   . Syncope and collapse 04/10/2012   "w/loss of consciousness" (04/10/2012)  . Type II diabetes mellitus ([place])     PAST SURGICAL HISTOIRY:   Past Surgical History:  Procedure Laterality Date  . APPENDECTOMY  ~ 1949  . AUGMENTATION MAMMAPLASTY Bilateral 1970  . CATARACT EXTRACTION W/ INTRAOCULAR LENS  IMPLANT, BILATERAL Bilateral ~ 2010  . COLOSTOMY  ~ 2010  . COLOSTOMY REVERSAL  ~ 2010   "wore a bag for ~ 6 months" (04/10/2012)  . DILATION AND CURETTAGE OF UTERUS  ~ 1964    "after a miscarriage" (04/10/2012)  . JOINT REPLACEMENT    . NASAL HEMORRHAGE CONTROL  ~ 2009   "had to have blood transfusion" (04/10/2012)  . TONSILLECTOMY  ~ 1949  . TOTAL HIP ARTHROPLASTY Left 09/2010  . VAGINAL HYSTERECTOMY  1970's?    SOCIAL HISTORY:   Social History  Substance Use Topics  . Smoking status: Never Smoker  . Smokeless tobacco: Never Used  . Alcohol use No    FAMILY HISTORY:   Family History  Problem Relation Age of Onset  . Hypertension Mother   . Diabetes Mellitus II Mother   . Heart attack Mother   . Lung cancer Father     DRUG ALLERGIES:   Allergies  Allergen Reactions  . Codeine Nausea And Vomiting  . 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 Promethazine Nausea And Vomiting  . Lidoderm [Lidocaine] Rash    REVIEW OF SYSTEMS:  CONSTITUTIONAL: No fever, fatigue or weakness.  EYES: No blurred or double vision.  EARS, NOSE, AND THROAT: No tinnitus or ear pain.  RESPIRATORY: No cough, shortness of breath, wheezing or hemoptysis.  CARDIOVASCULAR: No chest pain, orthopnea, edema.  GASTROINTESTINAL: Reporting nausea, vomiting, diarrhea  and generalizedbdominal pain.  GENITOURINARY: No dysuria, hematuria.  ENDOCRINE: No polyuria, nocturia,  HEMATOLOGY: No anemia, easy bruising or bleeding SKIN: No rash or lesion. MUSCULOSKELETAL: No joint pain or arthritis.  NEUROLOGIC: No tingling, numbness, weakness.  PSYCHIATRY: No anxiety or depression.   MEDICATIONS AT HOME:   Prior to Admission medications   Medication Sig Start Date End Date Taking? Authorizing Provider  amLODipine (NORVASC) 10 MG tablet Take 10 mg by mouth daily.   Yes [provider]  docusate sodium (COLACE) 100 MG capsule Take 1 capsule (100 mg total) by mouth 2 (two) times daily. Patient taking differently: Take 100 mg by mouth 2 (two) times daily as needed for moderate constipation.  11/16/14  Yes Gladstone Lighter, MD  DULoxetine  (CYMBALTA) 60 MG capsule Take 60 mg by mouth daily.   Yes [provider]  fludrocortisone (FLORINEF) 0.1 MG tablet Take 0.1 mg by mouth daily.   Yes [provider]  hydrocortisone (CORTEF) 10 MG tablet Take 10-20 mg by mouth 2 (two) times daily. 20 mg every morning and 10 mg at bedtime   Yes [provider]  insulin aspart (NOVOLOG) 100 UNIT/ML injection Inject 10-14 Units into the skin 3 (three) times daily before meals. 10 units daily with breakfast, 12 units daily with lunch, and 14 units daily with dinner   Yes [provider]  insulin glargine (LANTUS) 100 unit/mL SOPN Inject 15 Units into the skin at bedtime.    Yes [provider]  losartan (COZAAR) 50 MG tablet Take 50 mg by mouth daily.   Yes [provider]  lovastatin (MEVACOR) 40 MG tablet Take 40 mg by mouth daily with supper.    Yes [provider]  metoprolol tartrate (LOPRESSOR) 50 MG tablet Take 50 mg by mouth 2 (two) times daily.   Yes [provider]  mirtazapine (REMERON) 15 MG tablet Take 15 mg by mouth at bedtime.   Yes [provider]  nystatin (NYSTATIN) powder Apply topically 2 (two) times daily.   Yes [provider]  omeprazole (PRILOSEC) 40 MG capsule Take 40 mg by mouth 2 (two) times daily.   Yes [provider]      VITAL SIGNS:  Blood pressure (!) 116/49, pulse (!) 110, temperature 98.1 F (36.7 C), temperature source Oral, resp. rate (!) 31, SpO2 97 %.  PHYSICAL EXAMINATION:  GENERAL:  76 y.o.-year-old patient lying in the bed with no acute distress.  EYES: Pupils equal, round, reactive to light and accommodation. No scleral icterus. Extraocular muscles intact.  HEENT: Head atraumatic, normocephalic. Oropharynx and nasopharynx clear. Dry mucous membranes NECK:  Supple, no jugular venous distention. No thyroid enlargement, no tenderness.  LUNGS: Normal breath sounds bilaterally, no wheezing, rales,rhonchi or  crepitation. No use of accessory muscles of respiration.  CARDIOVASCULAR: S1, S2 normal. No murmurs, rubs, or gallops.  ABDOMEN: Soft, Minimal diffuse tenderness but no rebound tenderness, nondistended. Bowel sounds present. Marland Kitchen  EXTREMITIES: No pedal edema, cyanosis, or clubbing.  NEUROLOGIC: Cranial nerves II through XII are intact. Muscle strength 5/5 in all extremities. Sensation intact. Gait not checked.  PSYCHIATRIC: The patient is alert and oriented x 3.  SKIN: No obvious rash, lesion, or ulcer.   LABORATORY PANEL:   CBC  Recent Labs Lab 09/28/16 1330  WBC 6.5  HGB 12.1  HCT 38.0  PLT 378   ------------------------------------------------------------------------------------------------------------------  Chemistries   Recent Labs Lab 09/28/16 1330  NA 135  K 3.3*  CL 98*  CO2 24  GLUCOSE 318*  BUN 56*  CREATININE 2.75*  CALCIUM 9.3  AST 30  ALT 22  ALKPHOS 83  BILITOT 0.6   ------------------------------------------------------------------------------------------------------------------  Cardiac Enzymes No results for  input(s): TROPONINI in the last 168 hours. ------------------------------------------------------------------------------------------------------------------  RADIOLOGY:  Ct Abdomen Pelvis Wo Contrast  Result Date: 09/28/2016 CLINICAL DATA:  Abdominal pain, unspecified. Vomiting, diarrhea, and productive cough. EXAM: CT ABDOMEN AND PELVIS WITHOUT CONTRAST TECHNIQUE: Multidetector CT imaging of the abdomen and pelvis was performed following the standard protocol without IV contrast. COMPARISON:  10/30/2015 FINDINGS: Lower chest: Minimally seen bilateral breast calcification from implants. No acute finding. Hepatobiliary: No focal liver abnormality.No evidence of biliary obstruction or stone. Pancreas: Unremarkable. Spleen: Unremarkable. Adrenals/Urinary Tract: Negative adrenals. No hydronephrosis or stone. Mildly indistinct bladder wall without  definitive thickening. Stomach/Bowel: No obstruction. Colonic diverticulosis. Appendectomy. No bowel inflammation noted. Vascular/Lymphatic: No acute vascular abnormality. There is generalized aortic atherosclerotic calcification. No mass or adenopathy. Reproductive:Hysterectomy.  Negative adnexae. Other: No ascites or pneumoperitoneum. Musculoskeletal: Remote right obturator ring and right sacral ala fractures. Status post left hip arthroplasty. Advanced spinal degeneration, with particularly severe disc degeneration at L4-5 and L1-2. There is a transitional S1 vertebra based on the lowest ribs. IMPRESSION: 1. Possible cystitis.  Please correlate with urinalysis. 2. Colonic diverticulosis. 3.    Aortic Atherosclerosis (ICD10-I70.0). Electronically Signed   By: Monte Fantasia M.D.   On: 09/28/2016 15:49    EKG:   Orders placed or performed during the hospital encounter of 09/28/16  . EKG 12-Lead  . EKG 12-Lead  . EKG 12-Lead  . EKG 12-Lead    IMPRESSION AND PLAN:  Jordan Brewer  is a 76 y.o. 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 Patient has been vomiting and having diarrhea for 1 week. She just finished antibiotics for UTI 1 week ago    #Acute cystitis Admit patient to MedSurg unit Abnormal urinalysis with yeast and bacteria Start patient on IV Rocephin and Diflucan Urine culture and sensitivity ordered Gentle hydration with IV fluids  #Acute gastroenteritis with nausea vomiting and diarrhea Currently patient denies any vomiting Stool for C. difficile toxin is negative and GI panel is pending Patient is started on IV Rocephin; will add Flagyl Rectal tube for comfort CT abdomen with no acute abnormalities but cystitis and diverticulosis Clear liquid diet as tolerated  #Acute kidney injury from  dehydration from nausea vomiting and diarrhea Hydrated with IV fluids and hold nephrotoxins medications   #Diabetes mellitus Will provide sliding scale insulin Home medications Lantus   #History of Addison's disease patient is on Florinef  DVT prophylaxis with Lovenox subcutaneous and provide GI prophylaxis   All the records are reviewed and case discussed with ED provider. Management plans discussed with the patient, family and they are in agreement.  CODE STATUS: fc  TOTAL TIME TAKING CARE OF THIS PATIENT: 45 minutes.   Note: This dictation was prepared with Dragon dictation along with smaller phrase technology. Any transcriptional errors that result from this process are unintentional.  Nicholes Mango M.D on 09/28/2016 at 4:30 PM  Between 7am to 6pm - Pager - [phone]  After 6pm go to [email or web address] - password [email or web address] [facility]  Office  [phone]  CC: Primary care physician; Tracie Harrier, MD

## 2016-09-28 NOTE — ED Notes (Signed)
Brandy Rn and this RN cleaned pt bottom, back, legs, and arms of feces. Stool was brown/yellow liquid. Placed cream on bottom of pt. Pt stated that felt a lot better.

## 2016-09-29 LAB — GLUCOSE, CAPILLARY
GLUCOSE-CAPILLARY: 186 mg/dL — AB (ref 65–99)
Glucose-Capillary: 248 mg/dL — ABNORMAL HIGH (ref 65–99)
Glucose-Capillary: 179 mg/dL — ABNORMAL HIGH (ref 65–99)
Glucose-Capillary: 279 mg/dL — ABNORMAL HIGH (ref 65–99)

## 2016-09-29 LAB — BASIC METABOLIC PANEL
ANION GAP: 9 (ref 5–15)
BUN: 50 mg/dL — AB (ref 6–20)
CHLORIDE: 102 mmol/L (ref 101–111)
CO2: 23 mmol/L (ref 22–32)
Calcium: 8.5 mg/dL — ABNORMAL LOW (ref 8.9–10.3)
Creatinine, Ser: 2.23 mg/dL — ABNORMAL HIGH (ref 0.44–1.00)
GFR calc non Af Amer: 21 mL/min — ABNORMAL LOW (ref >60)
GFR, EST AFRICAN AMERICAN: 24 mL/min — AB (ref >60)
Glucose, Bld: 271 mg/dL — ABNORMAL HIGH (ref 65–99)
POTASSIUM: 3.9 mmol/L (ref 3.5–5.1)
SODIUM: 134 mmol/L — AB (ref 135–145)

## 2016-09-29 LAB — CBC
HCT: 34.1 % — ABNORMAL LOW (ref 35.0–47.0)
HEMOGLOBIN: 11.1 g/dL — AB (ref 12.0–16.0)
MCH: 25.9 pg — AB (ref 26.0–34.0)
MCHC: 32.5 g/dL (ref 32.0–36.0)
MCV: 79.6 fL — ABNORMAL LOW (ref 80.0–100.0)
Platelets: 300 10*3/uL (ref 150–440)
RBC: 4.29 MIL/uL (ref 3.80–5.20)
RDW: 19.7 % — ABNORMAL HIGH (ref 11.5–14.5)
WBC: 7.3 10*3/uL (ref 3.6–11.0)

## 2016-09-29 MED ORDER — SODIUM CHLORIDE 0.9 % IV SOLN
INTRAVENOUS | Status: DC
  Administered 2016-09-29 – 2016-10-03 (×10): via INTRAVENOUS

## 2016-09-29 MED ORDER — LOPERAMIDE HCL 2 MG PO CAPS
4.0000 mg | ORAL_CAPSULE | Freq: Four times a day (QID) | ORAL | Status: DC
  Administered 2016-09-29 – 2016-10-05 (×23): 4 mg via ORAL
  Filled 2016-09-29 (×23): qty 2

## 2016-09-29 MED ORDER — HYDROCORTISONE NA SUCCINATE PF 100 MG IJ SOLR
50.0000 mg | Freq: Three times a day (TID) | INTRAMUSCULAR | Status: DC
  Administered 2016-09-29 – 2016-10-03 (×13): 50 mg via INTRAVENOUS
  Filled 2016-09-29 (×14): qty 1

## 2016-09-29 MED ORDER — GUAIFENESIN-DM 100-10 MG/5ML PO SYRP
10.0000 mL | ORAL_SOLUTION | ORAL | Status: DC | PRN
  Administered 2016-09-29: 5 mL via ORAL
  Administered 2016-10-02 – 2016-10-04 (×2): 10 mL via ORAL
  Filled 2016-09-29 (×8): qty 10

## 2016-09-29 MED ORDER — FLUDROCORTISONE ACETATE 0.1 MG PO TABS
0.2000 mg | ORAL_TABLET | Freq: Every day | ORAL | Status: DC
  Administered 2016-09-30 – 2016-10-05 (×6): 0.2 mg via ORAL
  Filled 2016-09-29 (×6): qty 2

## 2016-09-29 NOTE — Progress Notes (Signed)
[place] at [facility]                                                                                                                                                                                  Patient Demographics   Jordan Brewer, is a 76 y.o. female, DOB - [birth year], YHC:[id number]  Admit date - 09/28/2016   Admitting Physician Nicholes Mango, MD  Outpatient Primary MD for the patient is Tracie Harrier, MD   LOS - 1  Subjective:  Patient admitted with severe diarrhea. Continues to have severe diarrhea. She does not have much abdominal pain but does not feel like eating.   Review of Systems:   CONSTITUTIONAL: No documented fever.positive fatigue,positive weakness. No weight gain, no weight loss.  EYES: No blurry or double vision.  ENT: No tinnitus. No postnasal drip. No redness of the oropharynx.  RESPIRATORY: No cough, no wheeze, no hemoptysis. No dyspnea.  CARDIOVASCULAR: No chest pain. No orthopnea. No palpitations. No syncope.  GASTROINTESTINAL: No nausea, no vomiting or positive diarrhea. No abdominal pain. No melena or hematochezia.  GENITOURINARY: No dysuria or hematuria.  ENDOCRINE: No polyuria or nocturia. No heat or cold intolerance.  HEMATOLOGY: No anemia. No bruising. No bleeding.  INTEGUMENTARY: No rashes. No lesions.  MUSCULOSKELETAL: No arthritis. No swelling. No gout.  NEUROLOGIC: No numbness, tingling, or ataxia. No seizure-type activity.  PSYCHIATRIC: No anxiety. No insomnia. No ADD.    Vitals:   Vitals:   09/28/16 2318 09/29/16 0629 09/29/16 0858 09/29/16 1310  BP: (!) 113/55 (!) 96/53 (!) 89/50 (!) 86/50  Pulse: (!) 106 80 73 72  Resp:  20  20  Temp:  97.7 F (36.5 C) (!) 97.5 F (36.4 C) 97.6 F (36.4 C)  TempSrc:  Oral Oral Oral  SpO2:  90% 92% 98%  Weight:      Height:        Wt Readings from Last 3 Encounters:  09/28/16 157 lb (71.2 kg)  11/14/15 157 lb 11.2 oz (71.5 kg)  11/07/15 171 lb 4.8 oz (77.7 kg)      Intake/Output Summary (Last 24 hours) at 09/29/16 1345 Last data filed at 09/29/16 0900  Gross per 24 hour  Intake             1140 ml  Output              200 ml  Net              940 ml    Physical Exam:   GENERAL: Pleasant-appearing in no apparent distress.  HEAD, EYES, EARS, NOSE AND THROAT: Atraumatic, normocephalic. Extraocular muscles are intact. Pupils equal and  reactive to light. Sclerae anicteric. No conjunctival injection. No oro-pharyngeal erythema.  NECK: Supple. There is no jugular venous distention. No bruits, no lymphadenopathy, no thyromegaly.  HEART: Regular rate and rhythm,. No murmurs, no rubs, no clicks.  LUNGS: Clear to auscultation bilaterally. No rales or rhonchi. No wheezes.  ABDOMEN: Soft, flat, nontender, nondistended. Has good bowel sounds. No hepatosplenomegaly appreciated.  EXTREMITIES: No evidence of any cyanosis, clubbing, or peripheral edema.  +2 pedal and radial pulses bilaterally.  NEUROLOGIC: The patient is alert, awake, and oriented x3 with no focal motor or sensory deficits appreciated bilaterally.  SKIN: Moist and warm with no rashes appreciated.  Psych: Not anxious, depressed LN: No inguinal LN enlargement    Antibiotics   Anti-infectives    Start     Dose/Rate Route Frequency Ordered Stop   09/29/16 1800  cefTRIAXone (ROCEPHIN) 1 g in dextrose 5 % 50 mL IVPB     1 g 100 mL/hr over 30 Minutes Intravenous Every 24 hours 09/28/16 1708     09/28/16 1800  fluconazole (DIFLUCAN) tablet 200 mg     200 mg Oral Daily 09/28/16 1708     09/28/16 1700  metroNIDAZOLE (FLAGYL) IVPB 500 mg     500 mg 100 mL/hr over 60 Minutes Intravenous Every 8 hours 09/28/16 1654     09/28/16 1445  cefTRIAXone (ROCEPHIN) 1 g in dextrose 5 % 50 mL IVPB - Premix     1 g 100 mL/hr over 30 Minutes Intravenous  Once 09/28/16 1440 09/28/16 2348      Medications   Scheduled Meds: . amLODipine  10 mg Oral Daily  . DULoxetine  60 mg Oral Daily  . enoxaparin  (LOVENOX) injection  30 mg Subcutaneous Q24H  . fluconazole  200 mg Oral Daily  . fludrocortisone  0.1 mg Oral Daily  . hydrocortisone sod succinate (SOLU-CORTEF) inj  50 mg Intravenous Q8H  . insulin aspart  0-5 Units Subcutaneous QHS  . insulin aspart  0-9 Units Subcutaneous TID WC  . loperamide  4 mg Oral Q6H  . metoprolol tartrate  50 mg Oral BID  . mirtazapine  15 mg Oral QHS  . nystatin   Topical BID  . pantoprazole  40 mg Oral Daily  . pravastatin  40 mg Oral q1800   Continuous Infusions: . sodium chloride 75 mL/hr at 09/29/16 0550  . cefTRIAXone (ROCEPHIN)  IV    . metronidazole Stopped (09/29/16 1005)   PRN Meds:.acetaminophen, guaiFENesin-dextromethorphan, ondansetron **OR** ondansetron (ZOFRAN) IV, traMADol   Data Review:   Micro Results Recent Results (from the past 240 hour(s))  Gastrointestinal Panel by PCR , Stool     Status: None   Collection Time: 09/28/16  1:44 PM  Result Value Ref Range Status   Campylobacter species NOT DETECTED NOT DETECTED Final   Plesimonas shigelloides NOT DETECTED NOT DETECTED Final   Salmonella species NOT DETECTED NOT DETECTED Final   Yersinia enterocolitica NOT DETECTED NOT DETECTED Final   Vibrio species NOT DETECTED NOT DETECTED Final   Vibrio cholerae NOT DETECTED NOT DETECTED Final   Enteroaggregative E coli (EAEC) NOT DETECTED NOT DETECTED Final   Enteropathogenic E coli (EPEC) NOT DETECTED NOT DETECTED Final   Enterotoxigenic E coli (ETEC) NOT DETECTED NOT DETECTED Final   Shiga like toxin producing E coli (STEC) NOT DETECTED NOT DETECTED Final   Shigella/Enteroinvasive E coli (EIEC) NOT DETECTED NOT DETECTED Final   Cryptosporidium NOT DETECTED NOT DETECTED Final   Cyclospora cayetanensis NOT DETECTED NOT DETECTED Final  Entamoeba histolytica NOT DETECTED NOT DETECTED Final   Giardia lamblia NOT DETECTED NOT DETECTED Final   Adenovirus F40/41 NOT DETECTED NOT DETECTED Final   Astrovirus NOT DETECTED NOT DETECTED Final    Norovirus GI/GII NOT DETECTED NOT DETECTED Final   Rotavirus A NOT DETECTED NOT DETECTED Final   Sapovirus (I, II, IV, and V) NOT DETECTED NOT DETECTED Final  C difficile quick scan w PCR reflex     Status: None   Collection Time: 09/28/16  1:44 PM  Result Value Ref Range Status   C Diff antigen NEGATIVE NEGATIVE Final   C Diff toxin NEGATIVE NEGATIVE Final   C Diff interpretation No C. difficile detected.  Final    Radiology Reports Ct Abdomen Pelvis Wo Contrast  Result Date: 09/28/2016 CLINICAL DATA:  Abdominal pain, unspecified. Vomiting, diarrhea, and productive cough. EXAM: CT ABDOMEN AND PELVIS WITHOUT CONTRAST TECHNIQUE: Multidetector CT imaging of the abdomen and pelvis was performed following the standard protocol without IV contrast. COMPARISON:  10/30/2015 FINDINGS: Lower chest: Minimally seen bilateral breast calcification from implants. No acute finding. Hepatobiliary: No focal liver abnormality.No evidence of biliary obstruction or stone. Pancreas: Unremarkable. Spleen: Unremarkable. Adrenals/Urinary Tract: Negative adrenals. No hydronephrosis or stone. Mildly indistinct bladder wall without definitive thickening. Stomach/Bowel: No obstruction. Colonic diverticulosis. Appendectomy. No bowel inflammation noted. Vascular/Lymphatic: No acute vascular abnormality. There is generalized aortic atherosclerotic calcification. No mass or adenopathy. Reproductive:Hysterectomy.  Negative adnexae. Other: No ascites or pneumoperitoneum. Musculoskeletal: Remote right obturator ring and right sacral ala fractures. Status post left hip arthroplasty. Advanced spinal degeneration, with particularly severe disc degeneration at L4-5 and L1-2. There is a transitional S1 vertebra based on the lowest ribs. IMPRESSION: 1. Possible cystitis.  Please correlate with urinalysis. 2. Colonic diverticulosis. 3.    Aortic Atherosclerosis (ICD10-I70.0). Electronically Signed   By: Monte Fantasia M.D.   On: 09/28/2016  15:49     CBC  Recent Labs Lab 09/28/16 1330 09/29/16 0641  WBC 6.5 7.3  HGB 12.1 11.1*  HCT 38.0 34.1*  PLT 378 300  MCV 79.7* 79.6*  MCH 25.4* 25.9*  MCHC 31.9* 32.5  RDW 19.4* 19.7*    Chemistries   Recent Labs Lab 09/28/16 1330 09/29/16 0641  NA 135 134*  K 3.3* 3.9  CL 98* 102  CO2 24 23  GLUCOSE 318* 271*  BUN 56* 50*  CREATININE 2.75* 2.23*  CALCIUM 9.3 8.5*  AST 30  --   ALT 22  --   ALKPHOS 83  --   BILITOT 0.6  --    ------------------------------------------------------------------------------------------------------------------ estimated creatinine clearance is 20.4 mL/min (A) (by C-G formula based on SCr of 2.23 mg/dL (H)). ------------------------------------------------------------------------------------------------------------------ No results for input(s): HGBA1C in the last 72 hours. ------------------------------------------------------------------------------------------------------------------ No results for input(s): CHOL, HDL, LDLCALC, TRIG, CHOLHDL, LDLDIRECT in the last 72 hours. ------------------------------------------------------------------------------------------------------------------ No results for input(s): TSH, T4TOTAL, T3FREE, THYROIDAB in the last 72 hours.  Invalid input(s): FREET3 ------------------------------------------------------------------------------------------------------------------ No results for input(s): VITAMINB12, FOLATE, FERRITIN, TIBC, IRON, RETICCTPCT in the last 72 hours.  Coagulation profile No results for input(s): INR, PROTIME in the last 168 hours.  No results for input(s): DDIMER in the last 72 hours.  Cardiac Enzymes No results for input(s): CKMB, TROPONINI, MYOGLOBIN in the last 168 hours.  Invalid input(s): CK ------------------------------------------------------------------------------------------------------------------ Invalid input(s): [place]   Jordan Brewer  is a 76 y.o. 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 Patient has been vomiting and having diarrhea for 1 week. She just finished antibiotics for UTI 1 week ago    #Acute cystitis continue antibiotics  #Acute gastroenteritis with nausea vomiting and diarrhea For C. Difficile is negative as well as PCR for bacteria also negative Due to history of adrenal insufficiency I will start patient on  hydrocortisone IV I will place patient on Imodium scheduled  #Acute kidney injury from dehydration from nausea vomiting and diarrhea Hydrated with IV fluids and hold nephrotoxins medications   #Diabetes mellitus Will provide sliding scale insulin Continue Lantus   #History of Addison's  As above       Code Status Orders        Start     Ordered   09/28/16 1709  Full code  Continuous     09/28/16 1708    Code Status History    Date Active Date Inactive Code Status Order ID Comments User Context   11/14/2015  5:51 PM 11/16/2015  5:12 PM Full Code [id number]  Nicholes Mango, MD Inpatient   11/07/2015  1:31 PM 11/11/2015  6:27 PM DNR [id number]  Demetrios Loll, MD Inpatient   10/30/2015  1:20 PM 11/02/2015  5:23 PM Full Code [id number]  Hower, Aaron Mose, MD ED   11/13/2014  5:38 PM 11/16/2014  7:20 PM Full Code [id number]  Nicholes Mango, MD Inpatient   04/10/2012  6:13 PM 04/11/2012  9:57 PM Full Code [id number]  Charlynne Cousins, MD Inpatient           Consults  none  DVT Prophylaxis  Lovenox    Lab Results  Component Value Date   PLT 300 09/29/2016     Time Spent in minutes   65min  Greater than 50% of time spent in care coordination and counseling patient regarding the condition and plan of care.   Dustin Flock M.D on 09/29/2016 at 1:45 PM  Between 7am to 6pm - Pager -  [phone]  After 6pm go to [email or web address] - password [email or web address] [place]   Office  [phone]

## 2016-09-29 NOTE — Clinical Social Work Note (Signed)
CSW received consult for possible SNF placement. CSW will follow pending PT recommendations.  Rossi Burdo Martha Evyn Kooyman, MSW, LCSWA [phone] 

## 2016-09-30 ENCOUNTER — Inpatient Hospital Stay: Payer: Medicare HMO

## 2016-09-30 LAB — CBC WITH DIFFERENTIAL/PLATELET
BASOS ABS: 0 10*3/uL (ref 0–0.1)
BASOS PCT: 0 %
EOS ABS: 0 10*3/uL (ref 0–0.7)
EOS PCT: 1 %
HCT: 31 % — ABNORMAL LOW (ref 35.0–47.0)
Hemoglobin: 9.9 g/dL — ABNORMAL LOW (ref 12.0–16.0)
LYMPHS ABS: 0.4 10*3/uL — AB (ref 1.0–3.6)
Lymphocytes Relative: 8 %
MCH: 25.3 pg — AB (ref 26.0–34.0)
MCHC: 31.9 g/dL — ABNORMAL LOW (ref 32.0–36.0)
MCV: 79.5 fL — ABNORMAL LOW (ref 80.0–100.0)
Monocytes Absolute: 0.1 10*3/uL — ABNORMAL LOW (ref 0.2–0.9)
Monocytes Relative: 2 %
Neutro Abs: 4.8 10*3/uL (ref 1.4–6.5)
Neutrophils Relative %: 89 %
PLATELETS: 335 10*3/uL (ref 150–440)
RBC: 3.9 MIL/uL (ref 3.80–5.20)
RDW: 19.6 % — ABNORMAL HIGH (ref 11.5–14.5)
WBC: 5.4 10*3/uL (ref 3.6–11.0)

## 2016-09-30 LAB — BASIC METABOLIC PANEL
Anion gap: 9 (ref 5–15)
BUN: 41 mg/dL — AB (ref 6–20)
CO2: 20 mmol/L — ABNORMAL LOW (ref 22–32)
Calcium: 8 mg/dL — ABNORMAL LOW (ref 8.9–10.3)
Chloride: 109 mmol/L (ref 101–111)
Creatinine, Ser: 2.11 mg/dL — ABNORMAL HIGH (ref 0.44–1.00)
GFR, EST AFRICAN AMERICAN: 25 mL/min — AB (ref >60)
GFR, EST NON AFRICAN AMERICAN: 22 mL/min — AB (ref >60)
Glucose, Bld: 244 mg/dL — ABNORMAL HIGH (ref 65–99)
POTASSIUM: 4.2 mmol/L (ref 3.5–5.1)
SODIUM: 138 mmol/L (ref 135–145)

## 2016-09-30 LAB — GLUCOSE, CAPILLARY
GLUCOSE-CAPILLARY: 222 mg/dL — AB (ref 65–99)
GLUCOSE-CAPILLARY: 192 mg/dL — AB (ref 65–99)
Glucose-Capillary: 194 mg/dL — ABNORMAL HIGH (ref 65–99)
Glucose-Capillary: 180 mg/dL — ABNORMAL HIGH (ref 65–99)

## 2016-09-30 LAB — URINE CULTURE

## 2016-09-30 LAB — HEMOGLOBIN A1C
Hgb A1c MFr Bld: 9.7 % — ABNORMAL HIGH (ref 4.8–5.6)
MEAN PLASMA GLUCOSE: 231.69 mg/dL

## 2016-09-30 MED ORDER — MORPHINE SULFATE (PF) 2 MG/ML IV SOLN
2.0000 mg | INTRAVENOUS | Status: DC | PRN
  Administered 2016-09-30 – 2016-10-04 (×6): 2 mg via INTRAVENOUS
  Filled 2016-09-30 (×6): qty 1

## 2016-09-30 MED ORDER — METRONIDAZOLE 500 MG PO TABS
500.0000 mg | ORAL_TABLET | Freq: Three times a day (TID) | ORAL | Status: DC
  Administered 2016-09-30: 500 mg via ORAL
  Filled 2016-09-30: qty 1

## 2016-09-30 MED ORDER — DIPHENHYDRAMINE HCL 25 MG PO CAPS
25.0000 mg | ORAL_CAPSULE | Freq: Four times a day (QID) | ORAL | Status: DC | PRN
  Administered 2016-10-01: 22:00:00 25 mg via ORAL
  Filled 2016-09-30: qty 1

## 2016-09-30 MED ORDER — MAGIC MOUTHWASH
15.0000 mL | Freq: Three times a day (TID) | ORAL | Status: DC
  Administered 2016-09-30 – 2016-10-04 (×12): 15 mL via ORAL
  Filled 2016-09-30 (×12): qty 20

## 2016-09-30 MED ORDER — METRONIDAZOLE IN NACL 5-0.79 MG/ML-% IV SOLN
500.0000 mg | Freq: Three times a day (TID) | INTRAVENOUS | Status: DC
  Administered 2016-09-30 – 2016-10-01 (×2): 500 mg via INTRAVENOUS
  Filled 2016-09-30 (×4): qty 100

## 2016-09-30 NOTE — Progress Notes (Signed)
Patient became confuse and pulled her PIV and rectal tube out. Reorientation done. Offered to re-insert rectal tube or flexiseal back but patient refused. MD made aware. New PIV G22 placed to left wrist, intact and patent with NS IVF running at 125 ml/hr. Patient is getting Imodium every 6 hours scheduled for diarrhea. Needs attended, kept safe and comfortable.

## 2016-09-30 NOTE — Evaluation (Signed)
Physical Therapy Evaluation Patient Details Name: Jordan Brewer MRN: [id number] DOB: 08-27-1940 Today's Date: 09/30/2016   History of Present Illness  76 yo female with onset of N&V and diarrhea was admitted with one week history of feeling bad pre-admission.  Has AKI from dehydration and UTI with gastroenteritis.  PMHx:  Addison's, HTN, DM, L ankle fracture, DVT, chronic pain, pyelonephritis, THA, atherosclerosis, diverticulosis, DDD  Clinical Impression  Pt is up to walk with assistance and has been kept close to side of bed since she is sitting very suddenly with no warning to PT.  Her plan is to continue strengthening and work toward getting home but for now is not safe to attempt alone.  Will continue as needed to progress mobility and shorten need to stay in SNF.    Follow Up Recommendations SNF    Equipment Recommendations  Rolling walker with 5" wheels (depending on her walker condition)    Recommendations for Other Services       Precautions / Restrictions Precautions Precautions: Fall Restrictions Weight Bearing Restrictions: No      Mobility  Bed Mobility Overal bed mobility: Needs Assistance Bed Mobility: Supine to Sit;Sit to Supine     Supine to sit: Min assist Sit to supine: Min assist   General bed mobility comments: minor help to lift trunk and to pivot in or out  Transfers Overall transfer level: Needs assistance Equipment used: Rolling walker (2 wheeled) Transfers: Sit to/from Stand Sit to Stand: Min assist;Mod assist         General transfer comment: cued hand placement and safety  Ambulation/Gait Ambulation/Gait assistance: Min assist Ambulation Distance (Feet): 12 Feet Assistive device: Rolling walker (2 wheeled);1 person hand held assist Gait Pattern/deviations: Step-to pattern;Decreased stride length;Trunk flexed;Wide base of support Gait velocity: reduced Gait velocity interpretation: Below normal speed for age/gender General Gait  Details: sidesteps on side of bed x 2  Stairs            Wheelchair Mobility    Modified Rankin (Stroke Patients Only)       Balance Overall balance assessment: History of Falls;Needs assistance Sitting-balance support: Feet supported Sitting balance-Leahy Scale: Fair     Standing balance support: Bilateral upper extremity supported;During functional activity Standing balance-Leahy Scale: Poor                               Pertinent Vitals/Pain Pain Assessment: No/denies pain    Home Living Family/patient expects to be discharged to:: Private residence Living Arrangements: Alone Available Help at Discharge: Family;Friend(s);Available PRN/intermittently Type of Home: House Home Access: Stairs to enter Entrance Stairs-Rails: Can reach both;Right;Left Entrance Stairs-Number of Steps: 3 Home Layout: One level Home Equipment: Walker - 4 wheels;Cane - quad      Prior Function Level of Independence: Independent with assistive device(s)         Comments: I in her house with cane or walker     Hand Dominance   Dominant Hand: Right    Extremity/Trunk Assessment   Upper Extremity Assessment Upper Extremity Assessment: Overall WFL for tasks assessed    Lower Extremity Assessment Lower Extremity Assessment: Generalized weakness    Cervical / Trunk Assessment Cervical / Trunk Assessment: Normal  Communication   Communication: No difficulties  Cognition Arousal/Alertness: Awake/alert Behavior During Therapy: WFL for tasks assessed/performed Overall Cognitive Status: Within Functional Limits for tasks assessed  General Comments      Exercises     Assessment/Plan    PT Assessment Patient needs continued PT services  PT Problem List Decreased strength;Decreased range of motion;Decreased balance;Decreased activity tolerance;Decreased mobility;Decreased coordination;Decreased knowledge of  use of DME;Decreased safety awareness;Obesity       PT Treatment Interventions DME instruction;Gait training;Stair training;Functional mobility training;Therapeutic activities;Therapeutic exercise;Balance training;Neuromuscular re-education;Patient/family education    PT Goals (Current goals can be found in the Care Plan section)  Acute Rehab PT Goals Patient Stated Goal: to get home and be safer PT Goal Formulation: With patient Time For Goal Achievement: 10/21/16 Potential to Achieve Goals: Good    Frequency Min 2X/week   Barriers to discharge Decreased caregiver support home alone with stairs to enter house    Co-evaluation               AM-PAC PT "6 Clicks" Daily Activity  Outcome Measure Difficulty turning over in bed (including adjusting bedclothes, sheets and blankets)?: Unable Difficulty moving from lying on back to sitting on the side of the bed? : Unable Difficulty sitting down on and standing up from a chair with arms (e.g., wheelchair, bedside commode, etc,.)?: Unable Help needed moving to and from a bed to chair (including a wheelchair)?: A Lot Help needed walking in hospital room?: A Lot Help needed climbing 3-5 steps with a railing? : Total 6 Click Score: 8    End of Session Equipment Utilized During Treatment: Gait belt Activity Tolerance: Patient limited by fatigue Patient left: in bed;with call bell/phone within reach;with bed alarm set Nurse Communication: Mobility status PT Visit Diagnosis: Unsteadiness on feet (R26.81);History of falling (Z91.81);Muscle weakness (generalized) (M62.81);Difficulty in walking, not elsewhere classified (R26.2)    Time: 3299-2426 PT Time Calculation (min) (ACUTE ONLY): 33 min   Charges:   PT Evaluation $PT Eval Moderate Complexity: 1 Mod PT Treatments $Gait Training: 8-22 mins   PT G Codes:   PT G-Codes **NOT FOR INPATIENT CLASS** Functional Assessment Tool Used: AM-PAC 6 Clicks Basic Mobility    Ramond Dial 09/30/2016, 5:01 PM   Mee Hives, PT MS Acute Rehab Dept. Number: [place] and [place]

## 2016-09-30 NOTE — Progress Notes (Signed)
PHARMACIST - PHYSICIAN COMMUNICATION DR:   Posey Pronto CONCERNING: Antibiotic IV to Oral Route Change Policy  RECOMMENDATION: This patient is receiving metronidazole by the intravenous route.  Based on criteria approved by the Pharmacy and Therapeutics Committee, the antibiotic(s) is/are being converted to the equivalent oral dose form(s).   DESCRIPTION: These criteria include:  Patient being treated for a respiratory tract infection, urinary tract infection, cellulitis or clostridium difficile associated diarrhea if on metronidazole  The patient is not neutropenic and does not exhibit a GI malabsorption state  The patient is eating (either orally or via tube) and/or has been taking other orally administered medications for a least 24 hours  The patient is improving clinically and has a Tmax < 100.5  If you have questions about this conversion, please contact the [place], PharmD 09/30/16 1:20 PM

## 2016-09-30 NOTE — Progress Notes (Signed)
[place] [facility]                                                                                                                                                                                  Patient Demographics   Kenslie Abbruzzese, is a 76 y.o. female, DOB - December 07, 1940, HGD:[id number]  Admit date - 09/28/2016   Admitting Physician Nicholes Mango, MD  Outpatient Primary MD for the patient is Tracie Harrier, MD   LOS - 2  Subjective:  Patient feeling little better diarrhea seems to have improved  Review of Systems:   CONSTITUTIONAL: No documented fever.positive fatigue,positive weakness. No weight gain, no weight loss.  EYES: No blurry or double vision.  ENT: No tinnitus. No postnasal drip. No redness of the oropharynx.  RESPIRATORY: No cough, no wheeze, no hemoptysis. No dyspnea.  CARDIOVASCULAR: No chest pain. No orthopnea. No palpitations. No syncope.  GASTROINTESTINAL: No nausea, no vomiting or positive diarrhea. No abdominal pain. No melena or hematochezia.  GENITOURINARY: No dysuria or hematuria.  ENDOCRINE: No polyuria or nocturia. No heat or cold intolerance.  HEMATOLOGY: No anemia. No bruising. No bleeding.  INTEGUMENTARY: No rashes. No lesions.  MUSCULOSKELETAL: No arthritis. No swelling. No gout.  NEUROLOGIC: No numbness, tingling, or ataxia. No seizure-type activity.  PSYCHIATRIC: No anxiety. No insomnia. No ADD.    Vitals:   Vitals:   09/29/16 2150 09/30/16 0630 09/30/16 0820 09/30/16 1158  BP: 103/61 (!) 101/53 118/61 (!) 107/50  Pulse: 90 95 100 75  Resp: 20 20 20    Temp: 98.3 F (36.8 C) 98.2 F (36.8 C) 98.1 F (36.7 C) 98.1 F (36.7 C)  TempSrc:  Oral Oral Oral  SpO2: 93% 97% 100% 98%  Weight:      Height:        Wt Readings from Last 3 Encounters:  09/28/16 157 lb (71.2 kg)  11/14/15 157 lb 11.2 oz (71.5 kg)  11/07/15 171 lb 4.8 oz (77.7 kg)     Intake/Output Summary (Last 24 hours) at 09/30/16 1312 Last data  filed at 09/30/16 1000  Gross per 24 hour  Intake          2947.58 ml  Output                0 ml  Net          2947.58 ml    Physical Exam:   GENERAL: Pleasant-appearing in no apparent distress.  HEAD, EYES, EARS, NOSE AND THROAT: Atraumatic, normocephalic. Extraocular muscles are intact. Pupils equal and reactive to light. Sclerae anicteric. No conjunctival injection. No oro-pharyngeal erythema.  NECK: Supple. There is no jugular venous distention. No  bruits, no lymphadenopathy, no thyromegaly.  HEART: Regular rate and rhythm,. No murmurs, no rubs, no clicks.  LUNGS: Clear to auscultation bilaterally. No rales or rhonchi. No wheezes.  ABDOMEN: Soft, flat, nontender, nondistended. Has good bowel sounds. No hepatosplenomegaly appreciated.  EXTREMITIES: No evidence of any cyanosis, clubbing, or peripheral edema.  +2 pedal and radial pulses bilaterally.  NEUROLOGIC: The patient is alert, awake, and oriented x3 with no focal motor or sensory deficits appreciated bilaterally.  SKIN: Moist and warm with no rashes appreciated.  Psych: Not anxious, depressed LN: No inguinal LN enlargement    Antibiotics   Anti-infectives    Start     Dose/Rate Route Frequency Ordered Stop   09/29/16 1800  cefTRIAXone (ROCEPHIN) 1 g in dextrose 5 % 50 mL IVPB     1 g 100 mL/hr over 30 Minutes Intravenous Every 24 hours 09/28/16 1708     09/28/16 1800  fluconazole (DIFLUCAN) tablet 200 mg     200 mg Oral Daily 09/28/16 1708     09/28/16 1700  metroNIDAZOLE (FLAGYL) IVPB 500 mg     500 mg 100 mL/hr over 60 Minutes Intravenous Every 8 hours 09/28/16 1654     09/28/16 1445  cefTRIAXone (ROCEPHIN) 1 g in dextrose 5 % 50 mL IVPB - Premix     1 g 100 mL/hr over 30 Minutes Intravenous  Once 09/28/16 1440 09/28/16 2348      Medications   Scheduled Meds: . amLODipine  10 mg Oral Daily  . DULoxetine  60 mg Oral Daily  . enoxaparin (LOVENOX) injection  30 mg Subcutaneous Q24H  . fluconazole  200 mg Oral  Daily  . fludrocortisone  0.2 mg Oral Daily  . hydrocortisone sod succinate (SOLU-CORTEF) inj  50 mg Intravenous Q8H  . insulin aspart  0-5 Units Subcutaneous QHS  . insulin aspart  0-9 Units Subcutaneous TID WC  . loperamide  4 mg Oral Q6H  . magic mouthwash  15 mL Oral TID  . metoprolol tartrate  50 mg Oral BID  . mirtazapine  15 mg Oral QHS  . nystatin   Topical BID  . pantoprazole  40 mg Oral Daily  . pravastatin  40 mg Oral q1800   Continuous Infusions: . sodium chloride 125 mL/hr at 09/30/16 0125  . cefTRIAXone (ROCEPHIN)  IV Stopped (09/29/16 1900)  . metronidazole Stopped (09/30/16 0922)   PRN Meds:.acetaminophen, diphenhydrAMINE, guaiFENesin-dextromethorphan, ondansetron **OR** ondansetron (ZOFRAN) IV, traMADol   Data Review:   Micro Results Recent Results (from the past 240 hour(s))  Gastrointestinal Panel by PCR , Stool     Status: None   Collection Time: 09/28/16  1:44 PM  Result Value Ref Range Status   Campylobacter species NOT DETECTED NOT DETECTED Final   Plesimonas shigelloides NOT DETECTED NOT DETECTED Final   Salmonella species NOT DETECTED NOT DETECTED Final   Yersinia enterocolitica NOT DETECTED NOT DETECTED Final   Vibrio species NOT DETECTED NOT DETECTED Final   Vibrio cholerae NOT DETECTED NOT DETECTED Final   Enteroaggregative E coli (EAEC) NOT DETECTED NOT DETECTED Final   Enteropathogenic E coli (EPEC) NOT DETECTED NOT DETECTED Final   Enterotoxigenic E coli (ETEC) NOT DETECTED NOT DETECTED Final   Shiga like toxin producing E coli (STEC) NOT DETECTED NOT DETECTED Final   Shigella/Enteroinvasive E coli (EIEC) NOT DETECTED NOT DETECTED Final   Cryptosporidium NOT DETECTED NOT DETECTED Final   Cyclospora cayetanensis NOT DETECTED NOT DETECTED Final   Entamoeba histolytica NOT DETECTED NOT DETECTED Final  Giardia lamblia NOT DETECTED NOT DETECTED Final   Adenovirus F40/41 NOT DETECTED NOT DETECTED Final   Astrovirus NOT DETECTED NOT DETECTED Final    Norovirus GI/GII NOT DETECTED NOT DETECTED Final   Rotavirus A NOT DETECTED NOT DETECTED Final   Sapovirus (I, II, IV, and V) NOT DETECTED NOT DETECTED Final  C difficile quick scan w PCR reflex     Status: None   Collection Time: 09/28/16  1:44 PM  Result Value Ref Range Status   C Diff antigen NEGATIVE NEGATIVE Final   C Diff toxin NEGATIVE NEGATIVE Final   C Diff interpretation No C. difficile detected.  Final  Urine culture     Status: Abnormal (Preliminary result)   Collection Time: 09/28/16  1:44 PM  Result Value Ref Range Status   Specimen Description URINE, CLEAN CATCH  Final   Special Requests NONE  Final   Culture (A)  Final    >=100,000 COLONIES/mL UNIDENTIFIED ORGANISM Performed at [facility], [address], [place], [place] [address]    Report Status PENDING  Incomplete    Radiology Reports Ct Abdomen Pelvis Wo Contrast  Result Date: 09/28/2016 CLINICAL DATA:  Abdominal pain, unspecified. Vomiting, diarrhea, and productive cough. EXAM: CT ABDOMEN AND PELVIS WITHOUT CONTRAST TECHNIQUE: Multidetector CT imaging of the abdomen and pelvis was performed following the standard protocol without IV contrast. COMPARISON:  10/30/2015 FINDINGS: Lower chest: Minimally seen bilateral breast calcification from implants. No acute finding. Hepatobiliary: No focal liver abnormality.No evidence of biliary obstruction or stone. Pancreas: Unremarkable. Spleen: Unremarkable. Adrenals/Urinary Tract: Negative adrenals. No hydronephrosis or stone. Mildly indistinct bladder wall without definitive thickening. Stomach/Bowel: No obstruction. Colonic diverticulosis. Appendectomy. No bowel inflammation noted. Vascular/Lymphatic: No acute vascular abnormality. There is generalized aortic atherosclerotic calcification. No mass or adenopathy. Reproductive:Hysterectomy.  Negative adnexae. Other: No ascites or pneumoperitoneum. Musculoskeletal: Remote right obturator ring and right sacral ala  fractures. Status post left hip arthroplasty. Advanced spinal degeneration, with particularly severe disc degeneration at L4-5 and L1-2. There is a transitional S1 vertebra based on the lowest ribs. IMPRESSION: 1. Possible cystitis.  Please correlate with urinalysis. 2. Colonic diverticulosis. 3.    Aortic Atherosclerosis (ICD10-I70.0). Electronically Signed   By: Monte Fantasia M.D.   On: 09/28/2016 15:49     CBC  Recent Labs Lab 09/28/16 1330 09/29/16 0641 09/30/16 0500  WBC 6.5 7.3 5.4  HGB 12.1 11.1* 9.9*  HCT 38.0 34.1* 31.0*  PLT 378 300 335  MCV 79.7* 79.6* 79.5*  MCH 25.4* 25.9* 25.3*  MCHC 31.9* 32.5 31.9*  RDW 19.4* 19.7* 19.6*  LYMPHSABS  --   --  0.4*  MONOABS  --   --  0.1*  EOSABS  --   --  0.0  BASOSABS  --   --  0.0    Chemistries   Recent Labs Lab 09/28/16 1330 09/29/16 0641 09/30/16 0500  NA 135 134* 138  K 3.3* 3.9 4.2  CL 98* 102 109  CO2 24 23 20*  GLUCOSE 318* 271* 244*  BUN 56* 50* 41*  CREATININE 2.75* 2.23* 2.11*  CALCIUM 9.3 8.5* 8.0*  AST 30  --   --   ALT 22  --   --   ALKPHOS 83  --   --   BILITOT 0.6  --   --    ------------------------------------------------------------------------------------------------------------------ estimated creatinine clearance is 21.6 mL/min (A) (by C-G formula based on SCr of 2.11 mg/dL (H)). ------------------------------------------------------------------------------------------------------------------  Recent Labs  09/28/16 1330  HGBA1C 9.7*   ------------------------------------------------------------------------------------------------------------------  No results for input(s): CHOL, HDL, LDLCALC, TRIG, CHOLHDL, LDLDIRECT in the last 72 hours. ------------------------------------------------------------------------------------------------------------------ No results for input(s): TSH, T4TOTAL, T3FREE, THYROIDAB in the last 72 hours.  Invalid input(s):  FREET3 ------------------------------------------------------------------------------------------------------------------ No results for input(s): VITAMINB12, FOLATE, FERRITIN, TIBC, IRON, RETICCTPCT in the last 72 hours.  Coagulation profile No results for input(s): INR, PROTIME in the last 168 hours.  No results for input(s): DDIMER in the last 72 hours.  Cardiac Enzymes No results for input(s): CKMB, TROPONINI, MYOGLOBIN in the last 168 hours.  Invalid input(s): CK ------------------------------------------------------------------------------------------------------------------ Invalid input(s): [place]   Derhonda Eastlick  is a 76 y.o. 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 Patient has been vomiting and having diarrhea for 1 week. She just finished antibiotics for UTI 1 week ago    #urinary tract infection Culture showing greater than 100,000 of unidentified bacteria continue current antibiotics  #Acute gastroenteritis with nausea vomiting and diarrhea For C. Difficile is negative as well as PCR for bacteria also negative Due to history of adrenal insufficiency continue  hydrocortisone IV Continue Imodium diarrhea seems to have stopped  #Acute kidney injury from dehydration from nausea vomiting and diarrhea Continue IV hydration  #Diabetes mellitus Will provide sliding scale insulin Continue Lantus   #History of Addison's  As above on IV hydrocortisone for 1 more day      Code Status Orders        Start     Ordered   09/28/16 1709  Full code  Continuous     09/28/16 1708    Code Status History    Date Active Date Inactive Code Status Order ID Comments User Context   11/14/2015  5:51 PM 11/16/2015  5:12 PM Full Code [id number]  Nicholes Mango, MD  Inpatient   11/07/2015  1:31 PM 11/11/2015  6:27 PM DNR [id number]  Demetrios Loll, MD Inpatient   10/30/2015  1:20 PM 11/02/2015  5:23 PM Full Code [id number]  Hower, Aaron Mose, MD ED   11/13/2014  5:38 PM 11/16/2014  7:20 PM Full Code [id number]  Nicholes Mango, MD Inpatient   04/10/2012  6:13 PM 04/11/2012  9:57 PM Full Code [id number]  Charlynne Cousins, MD Inpatient           Consults  none  DVT Prophylaxis  Lovenox    Lab Results  Component Value Date   PLT 335 09/30/2016     Time Spent in minutes   43min  Greater than 50% of time spent in care coordination and counseling patient regarding the condition and plan of care.   Dustin Flock M.D on 09/30/2016 at 1:12 PM  Between 7am to 6pm - Pager - [phone]  After 6pm go to [email or web address] - password [email or web address] [place] [facility]   Office  [phone]

## 2016-10-01 LAB — GLUCOSE, CAPILLARY
GLUCOSE-CAPILLARY: 172 mg/dL — AB (ref 65–99)
GLUCOSE-CAPILLARY: 193 mg/dL — AB (ref 65–99)
Glucose-Capillary: 194 mg/dL — ABNORMAL HIGH (ref 65–99)
Glucose-Capillary: 218 mg/dL — ABNORMAL HIGH (ref 65–99)

## 2016-10-01 LAB — CBC
HCT: 29.9 % — ABNORMAL LOW (ref 35.0–47.0)
Hemoglobin: 9.5 g/dL — ABNORMAL LOW (ref 12.0–16.0)
MCH: 25.4 pg — AB (ref 26.0–34.0)
MCHC: 31.7 g/dL — ABNORMAL LOW (ref 32.0–36.0)
MCV: 80.2 fL (ref 80.0–100.0)
PLATELETS: 376 10*3/uL (ref 150–440)
RBC: 3.73 MIL/uL — ABNORMAL LOW (ref 3.80–5.20)
RDW: 20.2 % — AB (ref 11.5–14.5)
WBC: 7.3 10*3/uL (ref 3.6–11.0)

## 2016-10-01 LAB — BASIC METABOLIC PANEL
Anion gap: 7 (ref 5–15)
BUN: 40 mg/dL — AB (ref 6–20)
CALCIUM: 7.7 mg/dL — AB (ref 8.9–10.3)
CHLORIDE: 115 mmol/L — AB (ref 101–111)
CO2: 20 mmol/L — AB (ref 22–32)
CREATININE: 1.89 mg/dL — AB (ref 0.44–1.00)
GFR calc Af Amer: 29 mL/min — ABNORMAL LOW (ref >60)
GFR calc non Af Amer: 25 mL/min — ABNORMAL LOW (ref >60)
Glucose, Bld: 207 mg/dL — ABNORMAL HIGH (ref 65–99)
Potassium: 4.2 mmol/L (ref 3.5–5.1)
SODIUM: 142 mmol/L (ref 135–145)

## 2016-10-01 MED ORDER — DOXYCYCLINE HYCLATE 100 MG PO TABS
100.0000 mg | ORAL_TABLET | Freq: Two times a day (BID) | ORAL | Status: DC
  Administered 2016-10-01 – 2016-10-05 (×9): 100 mg via ORAL
  Filled 2016-10-01 (×10): qty 1

## 2016-10-01 MED ORDER — FLUCONAZOLE 100 MG PO TABS: 100.0000 mg | ORAL_TABLET | Freq: Every day | ORAL | Status: DC

## 2016-10-01 MED ORDER — INSULIN GLARGINE 100 UNIT/ML ~~LOC~~ SOLN
8.0000 [IU] | Freq: Every day | SUBCUTANEOUS | Status: DC
  Administered 2016-10-01: 22:00:00 8 [IU] via SUBCUTANEOUS
  Filled 2016-10-01 (×2): qty 0.08

## 2016-10-01 NOTE — Consult Note (Addendum)
   [facility] Inpatient Consult   10/01/2016  Latandra Loureiro [birth year] [phone]     [facility] Care Management referral received.  Chart reviewed. Appears discharge plan is for SNF.  Telephone call to inpatient LCSW to confirm discharge plan. However, had to leave voicemail message to request call back if discharge plans change.   Will not engage for [place] Management at this time since patient appears to be going to SNF.   Marthenia Rolling, MSN-Ed, RN,BSN [facility] Liaison [phone]

## 2016-10-01 NOTE — NC FL2 (Signed)
[place] LEVEL OF CARE SCREENING TOOL     IDENTIFICATION  Patient Name: Jordan Brewer Birthdate: 02-12-1940 Sex: female Admission Date (Current Location): 09/28/2016  [place] and [place] Number:  Engineering geologist and Address:  [facility], [address], [place], [place] [address]      Provider Number: [id number]  Attending Physician Name and Address:  Dustin Flock, MD  Relative Name and Phone Number:       Current Level of Care: Hospital Recommended Level of Care: [place] Prior Approval Number:    Date Approved/Denied:   PASRR Number:   pending  Discharge Plan: SNF    Current Diagnoses: Patient Active Problem List   Diagnosis Date Noted  . AKI (acute kidney injury) ([place]) 09/28/2016  . Pain management 11/14/2015  . Syncope 11/07/2015  . Colitis 10/30/2015  . Sepsis ([place]) 10/30/2015  . Type II or unspecified type diabetes mellitus without mention of complication, not stated as uncontrolled 04/10/2012  . Unspecified essential hypertension 04/10/2012  . Bipolar I disorder, most recent episode (or current) unspecified 04/10/2012  . Addison's disease ([place]) 04/10/2012  . Chronic kidney disease 04/10/2012  . SAH (subarachnoid hemorrhage) ([place]) 04/10/2012    Orientation RESPIRATION BLADDER Height & Weight     Self, Time, Situation, Place  Normal Incontinent Weight: 157 lb (71.2 kg) Height:  5\' 2"  (157.5 cm)  BEHAVIORAL SYMPTOMS/MOOD NEUROLOGICAL BOWEL NUTRITION STATUS      Incontinent (rectal tube with balloon) Diet (See DC summary)  Diet DYS 3   AMBULATORY STATUS COMMUNICATION OF NEEDS Skin   Extensive Assist Verbally Normal                       Personal Care Assistance Level of Assistance  Bathing, Feeding, Dressing Bathing Assistance: Limited assistance Feeding assistance: Independent Dressing Assistance: Limited assistance     Functional Limitations Info  Sight, Hearing, Speech  Sight Info: Adequate Hearing Info: Adequate Speech Info: Adequate    SPECIAL CARE FACTORS FREQUENCY  PT (By licensed PT), OT (By licensed OT), Speech therapy     PT Frequency: 5x OT Frequency: 5x           Contractures Contractures Info: Not present    Additional Factors Info  Code Status, Allergies Code Status Info: Full Code Allergies Info: Codeine, Prednisone, Promethazine, Lidoderm Lidocaine           Current Medications (10/01/2016):  This is the current hospital active medication list Current Facility-Administered Medications  Medication Dose Route Frequency Provider Last Rate Last Dose  . 0.9 %  sodium chloride infusion   Intravenous Continuous Dustin Flock, MD 125 mL/hr at 10/01/16 0515    . acetaminophen (TYLENOL) tablet 650 mg  650 mg Oral Q6H PRN Gouru, Aruna, MD      . amLODipine (NORVASC) tablet 10 mg  10 mg Oral Daily Gouru, Aruna, MD   10 mg at 10/01/16 1043  . diphenhydrAMINE (BENADRYL) capsule 25 mg  25 mg Oral Q6H PRN Dustin Flock, MD      . doxycycline (VIBRA-TABS) tablet 100 mg  100 mg Oral Q12H Dustin Flock, MD      . DULoxetine (CYMBALTA) DR capsule 60 mg  60 mg Oral Daily Gouru, Aruna, MD   60 mg at 10/01/16 1043  . enoxaparin (LOVENOX) injection 30 mg  30 mg Subcutaneous Q24H Gouru, Aruna, MD   30 mg at 09/30/16 2048  . [START ON 10/02/2016] fluconazole (DIFLUCAN) tablet 100  mg  100 mg Oral Daily Pernell Dupre, RPH      . fludrocortisone (FLORINEF) tablet 0.2 mg  0.2 mg Oral Daily Dustin Flock, MD   0.2 mg at 10/01/16 1043  . guaiFENesin-dextromethorphan (ROBITUSSIN DM) 100-10 MG/5ML syrup 10 mL  10 mL Oral Q4H PRN Dustin Flock, MD   5 mL at 09/29/16 1800  . hydrocortisone sodium succinate (SOLU-CORTEF) 100 MG injection 50 mg  50 mg Intravenous Q8H Dustin Flock, MD   50 mg at 10/01/16 1215  . insulin aspart (novoLOG) injection 0-5 Units  0-5 Units Subcutaneous QHS Nicholes Mango, MD   3 Units at 09/29/16 2239  . insulin aspart  (novoLOG) injection 0-9 Units  0-9 Units Subcutaneous TID WC Gouru, Illene Silver, MD   3 Units at 10/01/16 1214  . loperamide (IMODIUM) capsule 4 mg  4 mg Oral Q6H Dustin Flock, MD   4 mg at 10/01/16 1214  . magic mouthwash  15 mL Oral TID Dustin Flock, MD   15 mL at 10/01/16 1046  . metoprolol tartrate (LOPRESSOR) tablet 50 mg  50 mg Oral BID Gouru, Aruna, MD   50 mg at 10/01/16 1046  . mirtazapine (REMERON) tablet 15 mg  15 mg Oral QHS Nicholes Mango, MD   Stopped at 09/30/16 2200  . morphine 2 MG/ML injection 2 mg  2 mg Intravenous Q4H PRN Lance Coon, MD   2 mg at 09/30/16 2049  . nystatin (MYCOSTATIN/NYSTOP) topical powder   Topical BID Gouru, Aruna, MD      . ondansetron (ZOFRAN) tablet 4 mg  4 mg Oral Q6H PRN Gouru, Aruna, MD   4 mg at 09/29/16 1200   Or  . ondansetron (ZOFRAN) injection 4 mg  4 mg Intravenous Q6H PRN Gouru, Aruna, MD      . pantoprazole (PROTONIX) EC tablet 40 mg  40 mg Oral Daily Gouru, Aruna, MD   40 mg at 10/01/16 1042  . pravastatin (PRAVACHOL) tablet 40 mg  40 mg Oral q1800 Gouru, Aruna, MD   40 mg at 09/30/16 1725  . traMADol (ULTRAM) tablet 50 mg  50 mg Oral Q6H PRN Gouru, Aruna, MD   50 mg at 09/30/16 1140     Discharge Medications: Please see discharge summary for a list of discharge medications.  Relevant Imaging Results:  Relevant Lab Results:   Additional Information SSN:  [id number]  [place], [place]

## 2016-10-01 NOTE — Progress Notes (Signed)
[place] at [facility]                                                                                                                                                                                  Patient Demographics   Jordan Brewer, is a 75 y.o. female, DOB - 06/21/1940, OEV:[id number]  Admit date - 09/28/2016   Admitting Physician Nicholes Mango, MD  Outpatient Primary MD for the patient is Tracie Harrier, MD   LOS - 3  Subjective:  She has a rectal tube in place Patient's complaining of cough and congestion  Review of Systems:   CONSTITUTIONAL: No documented fever.positive fatigue,positive weakness. No weight gain, no weight loss.  EYES: No blurry or double vision.  ENT: No tinnitus. No postnasal drip. No redness of the oropharynx.  RESPIRATORY:+cough, no wheeze, no hemoptysis. + dyspnea.  CARDIOVASCULAR: No chest pain. No orthopnea. No palpitations. No syncope.  GASTROINTESTINAL: No nausea, no vomiting or positive diarrhea. No abdominal pain. No melena or hematochezia.  GENITOURINARY: No dysuria or hematuria.  ENDOCRINE: No polyuria or nocturia. No heat or cold intolerance.  HEMATOLOGY: No anemia. No bruising. No bleeding.  INTEGUMENTARY: No rashes. No lesions.  MUSCULOSKELETAL: No arthritis. No swelling. No gout.  NEUROLOGIC: No numbness, tingling, or ataxia. No seizure-type activity.  PSYCHIATRIC: No anxiety. No insomnia. No ADD.    Vitals:   Vitals:   09/30/16 2120 10/01/16 0429 10/01/16 0829 10/01/16 1415  BP: (!) 103/53 112/63 (!) 107/50 (!) 100/53  Pulse: 81 92 89 79  Resp: 20 20 20    Temp: 97.8 F (36.6 C) 98.1 F (36.7 C) 97.6 F (36.4 C) 98.3 F (36.8 C)  TempSrc: Oral  Oral Oral  SpO2: 94% 91% 92% 94%  Weight:      Height:        Wt Readings from Last 3 Encounters:  09/28/16 157 lb (71.2 kg)  11/14/15 157 lb 11.2 oz (71.5 kg)  11/07/15 171 lb 4.8 oz (77.7 kg)     Intake/Output Summary (Last 24 hours) at 10/01/16  1427 Last data filed at 10/01/16 0400  Gross per 24 hour  Intake             3465 ml  Output                0 ml  Net             3465 ml    Physical Exam:   GENERAL: Pleasant-appearing in no apparent distress.  HEAD, EYES, EARS, NOSE AND THROAT: Atraumatic, normocephalic. Extraocular muscles are intact. Pupils equal and reactive to light. Sclerae anicteric. No conjunctival injection. No oro-pharyngeal erythema.  NECK: Supple. There is no jugular venous distention. No bruits, no lymphadenopathy, no thyromegaly.  HEART: Regular rate and rhythm,. No murmurs, no rubs, no clicks.  LUNGS: Clear to auscultation bilaterally. No rales or rhonchi. No wheezes.  ABDOMEN: Soft, flat, nontender, nondistended. Has good bowel sounds. No hepatosplenomegaly appreciated.  EXTREMITIES: No evidence of any cyanosis, clubbing, or peripheral edema.  +2 pedal and radial pulses bilaterally.  NEUROLOGIC: The patient is alert, awake, and oriented x3 with no focal motor or sensory deficits appreciated bilaterally.  SKIN: Moist and warm with no rashes appreciated.  Psych: Not anxious, depressed LN: No inguinal LN enlargement    Antibiotics   Anti-infectives    Start     Dose/Rate Route Frequency Ordered Stop   10/02/16 1000  fluconazole (DIFLUCAN) tablet 100 mg  Status:  Discontinued     100 mg Oral Daily 10/01/16 1116 10/01/16 1419   10/01/16 1230  doxycycline (VIBRA-TABS) tablet 100 mg     100 mg Oral Every 12 hours 10/01/16 1206     09/30/16 2045  metroNIDAZOLE (FLAGYL) IVPB 500 mg  Status:  Discontinued     500 mg 100 mL/hr over 60 Minutes Intravenous Every 8 hours 09/30/16 2037 10/01/16 1206   09/30/16 1400  metroNIDAZOLE (FLAGYL) tablet 500 mg  Status:  Discontinued     500 mg Oral Every 8 hours 09/30/16 1319 09/30/16 2037   09/29/16 1800  cefTRIAXone (ROCEPHIN) 1 g in dextrose 5 % 50 mL IVPB  Status:  Discontinued     1 g 100 mL/hr over 30 Minutes Intravenous Every 24 hours 09/28/16 1708 10/01/16  1206   09/28/16 1800  fluconazole (DIFLUCAN) tablet 200 mg  Status:  Discontinued     200 mg Oral Daily 09/28/16 1708 10/01/16 1116   09/28/16 1700  metroNIDAZOLE (FLAGYL) IVPB 500 mg  Status:  Discontinued     500 mg 100 mL/hr over 60 Minutes Intravenous Every 8 hours 09/28/16 1654 09/30/16 1319   09/28/16 1445  cefTRIAXone (ROCEPHIN) 1 g in dextrose 5 % 50 mL IVPB - Premix     1 g 100 mL/hr over 30 Minutes Intravenous  Once 09/28/16 1440 09/28/16 2348      Medications   Scheduled Meds: . amLODipine  10 mg Oral Daily  . doxycycline  100 mg Oral Q12H  . DULoxetine  60 mg Oral Daily  . enoxaparin (LOVENOX) injection  30 mg Subcutaneous Q24H  . fludrocortisone  0.2 mg Oral Daily  . hydrocortisone sod succinate (SOLU-CORTEF) inj  50 mg Intravenous Q8H  . insulin aspart  0-5 Units Subcutaneous QHS  . insulin aspart  0-9 Units Subcutaneous TID WC  . insulin glargine  8 Units Subcutaneous QHS  . loperamide  4 mg Oral Q6H  . magic mouthwash  15 mL Oral TID  . metoprolol tartrate  50 mg Oral BID  . mirtazapine  15 mg Oral QHS  . nystatin   Topical BID  . pantoprazole  40 mg Oral Daily  . pravastatin  40 mg Oral q1800   Continuous Infusions: . sodium chloride 125 mL/hr at 10/01/16 0515   PRN Meds:.acetaminophen, diphenhydrAMINE, guaiFENesin-dextromethorphan, morphine injection, ondansetron **OR** ondansetron (ZOFRAN) IV, traMADol   Data Review:   Micro Results Recent Results (from the past 240 hour(s))  Gastrointestinal Panel by PCR , Stool     Status: None   Collection Time: 09/28/16  1:44 PM  Result Value Ref Range Status   Campylobacter species NOT DETECTED NOT DETECTED Final  Plesimonas shigelloides NOT DETECTED NOT DETECTED Final   Salmonella species NOT DETECTED NOT DETECTED Final   Yersinia enterocolitica NOT DETECTED NOT DETECTED Final   Vibrio species NOT DETECTED NOT DETECTED Final   Vibrio cholerae NOT DETECTED NOT DETECTED Final   Enteroaggregative E coli (EAEC)  NOT DETECTED NOT DETECTED Final   Enteropathogenic E coli (EPEC) NOT DETECTED NOT DETECTED Final   Enterotoxigenic E coli (ETEC) NOT DETECTED NOT DETECTED Final   Shiga like toxin producing E coli (STEC) NOT DETECTED NOT DETECTED Final   Shigella/Enteroinvasive E coli (EIEC) NOT DETECTED NOT DETECTED Final   Cryptosporidium NOT DETECTED NOT DETECTED Final   Cyclospora cayetanensis NOT DETECTED NOT DETECTED Final   Entamoeba histolytica NOT DETECTED NOT DETECTED Final   Giardia lamblia NOT DETECTED NOT DETECTED Final   Adenovirus F40/41 NOT DETECTED NOT DETECTED Final   Astrovirus NOT DETECTED NOT DETECTED Final   Norovirus GI/GII NOT DETECTED NOT DETECTED Final   Rotavirus A NOT DETECTED NOT DETECTED Final   Sapovirus (I, II, IV, and V) NOT DETECTED NOT DETECTED Final  C difficile quick scan w PCR reflex     Status: None   Collection Time: 09/28/16  1:44 PM  Result Value Ref Range Status   C Diff antigen NEGATIVE NEGATIVE Final   C Diff toxin NEGATIVE NEGATIVE Final   C Diff interpretation No C. difficile detected.  Final  Urine culture     Status: Abnormal   Collection Time: 09/28/16  1:44 PM  Result Value Ref Range Status   Specimen Description URINE, CLEAN CATCH  Final   Special Requests NONE  Final   Culture >=100,000 COLONIES/mL YEAST (A)  Final   Report Status 09/30/2016 FINAL  Final    Radiology Reports Ct Abdomen Pelvis Wo Contrast  Result Date: 09/28/2016 CLINICAL DATA:  Abdominal pain, unspecified. Vomiting, diarrhea, and productive cough. EXAM: CT ABDOMEN AND PELVIS WITHOUT CONTRAST TECHNIQUE: Multidetector CT imaging of the abdomen and pelvis was performed following the standard protocol without IV contrast. COMPARISON:  10/30/2015 FINDINGS: Lower chest: Minimally seen bilateral breast calcification from implants. No acute finding. Hepatobiliary: No focal liver abnormality.No evidence of biliary obstruction or stone. Pancreas: Unremarkable. Spleen: Unremarkable.  Adrenals/Urinary Tract: Negative adrenals. No hydronephrosis or stone. Mildly indistinct bladder wall without definitive thickening. Stomach/Bowel: No obstruction. Colonic diverticulosis. Appendectomy. No bowel inflammation noted. Vascular/Lymphatic: No acute vascular abnormality. There is generalized aortic atherosclerotic calcification. No mass or adenopathy. Reproductive:Hysterectomy.  Negative adnexae. Other: No ascites or pneumoperitoneum. Musculoskeletal: Remote right obturator ring and right sacral ala fractures. Status post left hip arthroplasty. Advanced spinal degeneration, with particularly severe disc degeneration at L4-5 and L1-2. There is a transitional S1 vertebra based on the lowest ribs. IMPRESSION: 1. Possible cystitis.  Please correlate with urinalysis. 2. Colonic diverticulosis. 3.    Aortic Atherosclerosis (ICD10-I70.0). Electronically Signed   By: Monte Fantasia M.D.   On: 09/28/2016 15:49   Dg Chest 1 View  Result Date: 09/30/2016 CLINICAL DATA:  Possible aspiration when taking liquid meds . Admitted with kidney infection. Non smoker, denies heart/lung problems. EXAM: CHEST 1 VIEW COMPARISON:  09/30/2016 FINDINGS: Cardiac silhouette is normal in size. No mediastinal or hilar masses. No convincing adenopathy. Mild opacity is noted in the right lower lobe which is similar to the earlier study, but new from an exam dated 11/14/2015. This is likely atelectasis. Pneumonia or mild aspiration pneumonitis is possible. Remainder of the lungs is clear. No convincing pleural effusion. No pneumothorax. IMPRESSION: 1. No significant change  from the chest radiograph obtained earlier this same date. 2. Mild right lung base opacity which could reflect pneumonia or aspiration pneumonitis but more likely atelectasis. Electronically Signed   By: Lajean Manes M.D.   On: 09/30/2016 18:24   Dg Chest 2 View  Result Date: 09/30/2016 CLINICAL DATA:  Cough EXAM: CHEST  2 VIEW COMPARISON:  11/14/2015 FINDINGS:  Cardiac shadow is enlarged. Aortic calcifications are again seen. Mild vascular congestion is noted without significant edema. Calcified breast implants are noted bilaterally. IMPRESSION: Mild vascular congestion without significant edema. Electronically Signed   By: Inez Catalina M.D.   On: 09/30/2016 15:03     CBC  Recent Labs Lab 09/28/16 1330 09/29/16 0641 09/30/16 0500 10/01/16 0350  WBC 6.5 7.3 5.4 7.3  HGB 12.1 11.1* 9.9* 9.5*  HCT 38.0 34.1* 31.0* 29.9*  PLT 378 300 335 376  MCV 79.7* 79.6* 79.5* 80.2  MCH 25.4* 25.9* 25.3* 25.4*  MCHC 31.9* 32.5 31.9* 31.7*  RDW 19.4* 19.7* 19.6* 20.2*  LYMPHSABS  --   --  0.4*  --   MONOABS  --   --  0.1*  --   EOSABS  --   --  0.0  --   BASOSABS  --   --  0.0  --     Chemistries   Recent Labs Lab 09/28/16 1330 09/29/16 0641 09/30/16 0500 10/01/16 0350  NA 135 134* 138 142  K 3.3* 3.9 4.2 4.2  CL 98* 102 109 115*  CO2 24 23 20* 20*  GLUCOSE 318* 271* 244* 207*  BUN 56* 50* 41* 40*  CREATININE 2.75* 2.23* 2.11* 1.89*  CALCIUM 9.3 8.5* 8.0* 7.7*  AST 30  --   --   --   ALT 22  --   --   --   ALKPHOS 83  --   --   --   BILITOT 0.6  --   --   --    ------------------------------------------------------------------------------------------------------------------ estimated creatinine clearance is 24.1 mL/min (A) (by C-G formula based on SCr of 1.89 mg/dL (H)). ------------------------------------------------------------------------------------------------------------------ No results for input(s): HGBA1C in the last 72 hours. ------------------------------------------------------------------------------------------------------------------ No results for input(s): CHOL, HDL, LDLCALC, TRIG, CHOLHDL, LDLDIRECT in the last 72 hours. ------------------------------------------------------------------------------------------------------------------ No results for input(s): TSH, T4TOTAL, T3FREE, THYROIDAB in the last 72  hours.  Invalid input(s): FREET3 ------------------------------------------------------------------------------------------------------------------ No results for input(s): VITAMINB12, FOLATE, FERRITIN, TIBC, IRON, RETICCTPCT in the last 72 hours.  Coagulation profile No results for input(s): INR, PROTIME in the last 168 hours.  No results for input(s): DDIMER in the last 72 hours.  Cardiac Enzymes No results for input(s): CKMB, TROPONINI, MYOGLOBIN in the last 168 hours.  Invalid input(s): CK ------------------------------------------------------------------------------------------------------------------ Invalid input(s): [place]   Jordan Brewer  is a 76 y.o. 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 Patient has been vomiting and having diarrhea for 1 week. She just finished antibiotics for UTI 1 week ago   #Possible aspiration pneumonia with cough She was seen by speech Patient's antibiotic changed to doxycycline   #Abnormal UA with yeast Status post treatment with antifungals no further treatment needed  #Acute gastroenteritis with nausea vomiting and diarrhea For C. Difficile is negative as well as PCR for bacteria also negative Due to history of adrenal insufficiency continue  hydrocortisone IV- can switch back to  home dose tomorrow Continue Imodium diarrhea improved  #Acute kidney injury from dehydration from nausea vomiting and diarrhea Continue IV hydrationslowly improving  #Diabetes mellitus Will provide sliding scale insulin Continue Lantus  #History of Addison's  As above on IV hydrocortisone IV     Code Status Orders        Start     Ordered   09/28/16 1709  Full code  Continuous     09/28/16 1708    Code Status History    Date  Active Date Inactive Code Status Order ID Comments User Context   11/14/2015  5:51 PM 11/16/2015  5:12 PM Full Code [id number]  Nicholes Mango, MD Inpatient   11/07/2015  1:31 PM 11/11/2015  6:27 PM DNR [id number]  Demetrios Loll, MD Inpatient   10/30/2015  1:20 PM 11/02/2015  5:23 PM Full Code [id number]  Hower, Aaron Mose, MD ED   11/13/2014  5:38 PM 11/16/2014  7:20 PM Full Code [id number]  Nicholes Mango, MD Inpatient   04/10/2012  6:13 PM 04/11/2012  9:57 PM Full Code [id number]  Charlynne Cousins, MD Inpatient           Consults  none  DVT Prophylaxis  Lovenox    Lab Results  Component Value Date   PLT 376 10/01/2016     Time Spent in minutes   47min  Greater than 50% of time spent in care coordination and counseling patient regarding the condition and plan of care.   Dustin Flock M.D on 10/01/2016 at 2:27 PM  Between 7am to 6pm - Pager - [phone]  After 6pm go to [email or web address] - password [email or web address] [place] [facility]   Office  [phone]

## 2016-10-01 NOTE — Progress Notes (Signed)
PHARMACY NOTE -  ANTIBIOTIC RENAL DOSE ADJUSTMENT  Per [place] to assist with antibiotic renal dose adjustment.   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  SCr 1.89, estimated CrCl 24.1 ml/min  Fluconazole dose should be reduced 50% when CrCl is <70ml/min. Fluconazole dose has been adjusted from 200mg  QD to 100mg  QD based on current CrCl of 24.48ml/min.  Need for further dosage adjustment appears unlikely at present.  Pernell Dupre, PharmD, BCPS Clinical Pharmacist 10/01/2016 11:20 AM

## 2016-10-01 NOTE — Progress Notes (Signed)
Inpatient Diabetes Program Recommendations  AACE/ADA: New Consensus Statement on Inpatient Glycemic Control (2015)  Target Ranges:  Prepandial:   less than 140 mg/dL      [facility] postprandial:   less than 180 mg/dL (1-2 hours)      Critically ill patients:  140 - 180 mg/dL   Results for ETERNITY, DEXTER (MRN [id number]) as of 10/01/2016 08:22  Ref. Range 09/30/2016 07:33 09/30/2016 11:23 09/30/2016 17:14 09/30/2016 21:24  Glucose-Capillary Latest Ref Range: 65 - 99 mg/dL 222 (H) 192 (H) 194 (H) 180 (H)   Results for TAUNIA, FRASCO (MRN [id number]) as of 10/01/2016 08:22  Ref. Range 10/01/2016 07:56  Glucose-Capillary Latest Ref Range: 65 - 99 mg/dL 194 (H)    Admit with: Cystitis/ Diarrhea   History: DM, Addison's  Home DM Meds: Lantus 15 units QHS       Novolog 10 units Breakfast/ 12 units Lunch/ 14 units Dinner  Current Insulin Orders: Novolog Sensitive Correction Scale/ SSI (0-9 units) TID AC + HS      MD- Note patient getting Solucortef 50 mg Q8 hours + Florinef 0.2 mg daily.  Currently NPO waiting for Speech/Swallow Evaluation per nursing notes.  CBGs slightly elevated above goal- Patient takes Lantus and Novolog at home.  MD- Please consider starting 50% home dose of Lantus for this patient:  Lantus 8 units QHS     --Will follow patient during hospitalization--  Wyn Quaker RN, MSN, CDE Diabetes Coordinator Inpatient Glycemic Control Team Team Pager: [phone] (8a-5p)

## 2016-10-01 NOTE — Clinical Social Work Placement (Signed)
   CLINICAL SOCIAL WORK PLACEMENT  NOTE  Date:  10/01/2016  Patient Details  Name: Jordan Brewer MRN: [id number] Date of Birth: 1940-03-28  Clinical Social Work is seeking post-discharge placement for this patient at the [place] level of care (*CSW will initial, date and re-position this form in  chart as items are completed):  Yes   Patient/family provided with [place] Work Department's list of facilities offering this level of care within the geographic area requested by the patient (or if unable, by the patient's family).  Yes   Patient/family informed of their freedom to choose among providers that offer the needed level of care, that participate in Medicare, Medicaid or managed care program needed by the patient, have an available bed and are willing to accept the patient.  Yes   Patient/family informed of [facility]'s ownership interest in [facility] and [facility], as well as of the fact that they are under no obligation to receive care at these facilities.  PASRR submitted to EDS on 10/01/16     PASRR number received on       Existing PASRR number confirmed on       FL2 transmitted to all facilities in geographic area requested by pt/family on 10/01/16     FL2 transmitted to all facilities within larger geographic area on       Patient informed that his/her managed care company has contracts with or will negotiate with certain facilities, including the following:            Patient/family informed of bed offers received.  Patient chooses bed at       Physician recommends and patient chooses bed at      Patient to be transferred to   on  .  Patient to be transferred to facility by       Patient family notified on   of transfer.  Name of family member notified:        PHYSICIAN Please sign FL2     Additional Comment:    _______________________________________________ Lilly Cove, LCSW 10/01/2016, 1:49  PM

## 2016-10-01 NOTE — Clinical Social Work Note (Signed)
Clinical Social Work Assessment  Patient Details  Name: Jordan Brewer MRN: [id number] Date of Birth: 1940/08/23  Date of referral:  10/01/16               Reason for consult:  Facility Placement, Discharge Planning                Permission sought to share information with:  Case Manager, Facility Sport and exercise psychologist Permission granted to share information::  Yes, Verbal Permission Granted  Name::        Agency::  SNF search  Relationship::  daughter and son:  Jordan Brewer and Production designer, theatre/television/film Information:     Housing/Transportation Living arrangements for the past 2 months:  [place] of Information:  Patient, Medical Team, Case Manager Patient Interpreter Needed:  None Criminal Activity/Legal Involvement Pertinent to Current Situation/Hospitalization:  No - Comment as needed Significant Relationships:    Lives with:  Adult Children Do you feel safe going back to the place where you live?  No Need for family participation in patient care:  Yes (Comment)  Care giving concerns:  Patient admitted to [facility]  with a known history of Addison's disease on Florinef, hypertension, hyperlipidemia,, diabetes medicines and other medical problems is presenting to the ED with a chief complaint of diarrhea as well as nausea and vomiting. Patient has been vomiting and having diarrhea for 1 week. She just finished antibiotics for UTI 1 week ago. Reporting diffuse abdominal pain. During my examination patient denies any vomiting or nausea but reporting diarrhea. Patient's creatinine has gotten worse from 0.9-2.8  Patient at this time reports she is not safe to go home yet, and needs extended help as she received in the past at [facility]. She is hopeful to return as she reports staff and care were fantastic.   Patient agreeable to SNF.  She lives at home alone with her Cat Bumble bee for the last 2 years. Has a son and daughter whom she refers to as the good children who assist, but  also 2 children who have been caught up in substance use and in prison. Reports she uses a walker at home that she borrows, but still drives herself to doctors and grocery store.    Social Worker assessment / plan:  Assessment and consult completed for ST rehab/discharge planning. LCSW completed SNF work up [employer] pending due to bipolar dx. Insurance also required for placement. Patient is hopeful for [facility] and LCSW sent referrals. Will follow up on bed offers an insurance.  Plan: SNF  Employment status:  Retired Forensic scientist:  Managed Care PT Recommendations:  [place] / Referral to community resources:  [place]  Patient/Family's Response to care:  Understanding of plan and agreeable  Patient/Family's Understanding of and Emotional Response to Diagnosis, Current Treatment, and Prognosis:  Patient reports she has been having issues swallowing and on a new diet.  Reports she does not like it but she is making the best of things and taking one day at a time. She is aware of her limitations and understands plans for discharge.  Emotional Assessment Appearance:  Appears stated age Attitude/Demeanor/Rapport:    Affect (typically observed):  Accepting, Adaptable Orientation:  Oriented to Self, Oriented to Place, Oriented to  Time, Oriented to Situation Alcohol / Substance use:  Not Applicable Psych involvement (Current and /or in the community):  No (Comment) (does have Bipolar DX, no behaviors)  Discharge Needs  Concerns to be addressed:  Denies Needs/Concerns at this time Readmission within the last 30 days:  No Current discharge risk:  None Barriers to Discharge:  Continued Medical Work up, [place], [place], [place] 10/01/2016, 1:51 PM

## 2016-10-01 NOTE — Evaluation (Signed)
Clinical/Bedside Swallow Evaluation Patient Details  Name: Jordan Brewer MRN: [id number] Date of Birth: 1940-11-20  Today's Date: 10/01/2016 Time: SLP Start Time (ACUTE ONLY): 73 SLP Stop Time (ACUTE ONLY): 1107 SLP Time Calculation (min) (ACUTE ONLY): 60 min  Past Medical History:  Past Medical History:  Diagnosis Date  . Addison's disease ([place])   . Ankle fracture, left 02/2012  . Arthritis    "all over my body" (04/10/2012)  . Bipolar depression ([place])   . Cancer ([place])   . Chronic back pain    "since my hip OR in 09/2010"  . Depression   . DVT, lower extremity ([place]) 09/2010   "left; after hip replacement" (04/10/2012)  . Fall at home 02/2012   'foot got caught on heating pad cord; broke left ankle" (04/10/2012)  . GERD (gastroesophageal reflux disease)   . History of blood transfusion   . Hypercholesteremia   . Hypertension   . Syncope and collapse 04/10/2012   "w/loss of consciousness" (04/10/2012)  . Type II diabetes mellitus ([place])    Past Surgical History:  Past Surgical History:  Procedure Laterality Date  . APPENDECTOMY  ~ 1949  . AUGMENTATION MAMMAPLASTY Bilateral 1970  . CATARACT EXTRACTION W/ INTRAOCULAR LENS  IMPLANT, BILATERAL Bilateral ~ 2010  . COLOSTOMY  ~ 2010  . COLOSTOMY REVERSAL  ~ 2010   "wore a bag for ~ 6 months" (04/10/2012)  . DILATION AND CURETTAGE OF UTERUS  ~ 1964   "after a miscarriage" (04/10/2012)  . JOINT REPLACEMENT    . NASAL HEMORRHAGE CONTROL  ~ 2009   "had to have blood transfusion" (04/10/2012)  . TONSILLECTOMY  ~ 1949  . TOTAL HIP ARTHROPLASTY Left 09/2010  . VAGINAL HYSTERECTOMY  1970's?   HPI:  Pt is a 76 y.o. female with a known history of Addison's disease on Florinef, hypertension, hyperlipidemia,, diabetes, GERD, Bipolar depression, chronic back pain, and other medical problems is presenting to the ED with a chief complaint of diarrhea as well as nausea and vomiting. Patient has been vomiting and having diarrhea for 1+ weeks and stated  she had not been eating or drinking "much at all". She just finished antibiotics for UTI 1 week ago. Reporting diffuse abdominal pain. Pt was admitted w/ acute cystitis and Acute gastroenteritis with nausea vomiting and diarrhea. She has been on a clear liquid diet initially since admission. Pt has been confused and needing monittoring per chart notes. Pt does specifically c/o a "coating on the roof of my mouth that I have to scrape off in the mornings" for years - pt does have baseline GERD on a PPI; this could be related to reflux during the night and increased phlegm coating(?).    Assessment / Plan / Recommendation Clinical Impression  Pt appears to present w/ adequate oropharyngeal phase swallow function w/ no immediate, overt s/s of aspiration noted during po trials given at this exam. Pt consumed trials of thin liquids and purees/softened solids w/ no overt coughing or throat clearing noted; vocal quality clear b/t trials and no decline in respiratory status occurred. Pt even swallowed Pills whole w/ sips of thin liquids (w/ NSG presenting) w/ no overt s/s of aspiration noted. Of note, frequent belching and globus feeling was noted during po trials. Pt was educated on need to give time b/t eating/drinking to allow for belching. Oral phase was c/b slower, deliberate mastication of softened solids d/t missing many dentition but given time, she was able to manipulate and mash/chew bolus trials and  clear orally. No oral weakness noted. Pt was able to feed self w/ setup assistance given. Recommend a dysphagia level 3 for softer foods, cut meats; Thin liquids. Recommend general aspiration precautions and to carefully monitor self/environment during oral intake d/t being easily distracted and frequent (body) movements in bed. 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 Discussed use of applesauce to swallow Pills IF needed for safer, easier  swallowing. Of note, d/t pt's Esophageal dysmotility and GERD at baseline, she could be at increased risk for aspiration of REFLUX, regurgitated material resulting in negative sequelae including decline in pulmonary status.  SLP Visit Diagnosis: Dysphagia, oropharyngeal phase (R13.12)    Aspiration Risk  Mild aspiration risk (but reduced following precautions)    Diet Recommendation  Dysphagia level 3(mech soft) w/ Thin liquids; general aspiration precautions; REFLUX precautions  Medication Administration: Whole meds with puree (IF needed for easier swallowing)    Other  Recommendations Recommended Consults: Consider GI evaluation (Dietician f/u) Oral Care Recommendations: Oral care BID;Patient independent with oral care;Staff/trained caregiver to provide oral care   Follow up Recommendations None      Frequency and Duration min 2x/week  1 week       Prognosis Prognosis for Safe Diet Advancement: Fair (-Good) Barriers to Reach Goals: Behavior;Cognitive deficits (missing dentition)      Swallow Study   General Date of Onset: 09/28/16 HPI: Pt is a 76 y.o. female with a known history of Addison's disease on Florinef, hypertension, hyperlipidemia,, diabetes, GERD, Bipolar depression, chronic back pain, and other medical problems is presenting to the ED with a chief complaint of diarrhea as well as nausea and vomiting. Patient has been vomiting and having diarrhea for 1+ weeks and stated she had not been eating or drinking "much at all". She just finished antibiotics for UTI 1 week ago. Reporting diffuse abdominal pain. Pt was admitted w/ acute cystitis and Acute gastroenteritis with nausea vomiting and diarrhea. She has been on a clear liquid diet initially since admission. Pt has been confused and needing monittoring per chart notes. Pt does specifically c/o a "coating on the roof of my mouth that I have to scrape off in the mornings" for years - pt does have baseline GERD on a PPI; this  could be related to reflux during the night and increased phlegm coating(?).  Type of Study: Bedside Swallow Evaluation Previous Swallow Assessment: none reported Diet Prior to this Study: Regular;Thin liquids (at home) Temperature Spikes Noted: No (wbc have not been elevated x3 days now) Respiratory Status: Room air History of Recent Intubation: No Behavior/Cognition: Alert;Cooperative;Pleasant mood;Distractible;Requires cueing (unsure of baseline cognitive status) Oral Cavity Assessment: Within Functional Limits Oral Care Completed by SLP: Recent completion by staff Oral Cavity - Dentition: Missing dentition (most) Vision: Functional for self-feeding Self-Feeding Abilities: Able to feed self;Needs set up Patient Positioning: Upright in bed Baseline Vocal Quality: Normal Volitional Cough: Strong Volitional Swallow: Able to elicit    Oral/Motor/Sensory Function Overall Oral Motor/Sensory Function: Within functional limits   Ice Chips Ice chips: Within functional limits Presentation: Spoon (fed; 3 trials)   Thin Liquid Thin Liquid: Within functional limits Presentation: Cup;Self Fed;Straw (4+ ozs - even swallowed pills using juice) Other Comments: NSG present    Nectar Thick Nectar Thick Liquid: Not tested   Honey Thick Honey Thick Liquid: Not tested   Puree Puree: Within functional limits Presentation: Spoon;Self Fed (4 ozs)   Solid   GO   Solid: Impaired (softened trials)  Presentation: Self Fed (4 trials ) Oral Phase Impairments: Impaired mastication (missing dentition) Oral Phase Functional Implications: Impaired mastication Pharyngeal Phase Impairments:  (none) Other Comments: pt is missing dentition - given time she was able to [facility] and clear appropriately    Functional Assessment Tool Used: clinical judgement Functional Limitations: Swallowing Swallow Current Status (D1497): At least 1 percent but less than 20 percent impaired, limited or restricted Swallow Goal  Status [phone]): At least 1 percent but less than 20 percent impaired, limited or restricted Swallow Discharge Status [phone]): At least 1 percent but less than 20 percent impaired, limited or restricted    Orinda Kenner, MS, CCC-SLP Watson,Katherine 10/01/2016,1:50 PM

## 2016-10-01 NOTE — Plan of Care (Signed)
Problem: SLP Dysphagia Goals Goal: Misc Dysphagia Goal Pt will safely tolerate po diet of least restrictive consistency w/ no overt s/s of aspiration noted by Staff/pt/family x3 sessions.    

## 2016-10-01 NOTE — Progress Notes (Signed)
Patient is fasting pending speech therapist evaluation. Flagyl PO shifted back to I.V because of patient being NPO. Rectal tube/Flexiseal placed, intact and patent, secured in place, balooned with 42ml of NS. Kept safe and comfortable. Needs attended.

## 2016-10-02 ENCOUNTER — Inpatient Hospital Stay: Payer: Medicare HMO

## 2016-10-02 LAB — BASIC METABOLIC PANEL
Anion gap: 5 (ref 5–15)
BUN: 31 mg/dL — ABNORMAL HIGH (ref 6–20)
CALCIUM: 7.4 mg/dL — AB (ref 8.9–10.3)
CO2: 20 mmol/L — AB (ref 22–32)
CREATININE: 1.6 mg/dL — AB (ref 0.44–1.00)
Chloride: 120 mmol/L — ABNORMAL HIGH (ref 101–111)
GFR, EST AFRICAN AMERICAN: 36 mL/min — AB (ref >60)
GFR, EST NON AFRICAN AMERICAN: 31 mL/min — AB (ref >60)
GLUCOSE: 206 mg/dL — AB (ref 65–99)
Potassium: 4.2 mmol/L (ref 3.5–5.1)
Sodium: 145 mmol/L (ref 135–145)

## 2016-10-02 LAB — CBC
HEMATOCRIT: 29.5 % — AB (ref 35.0–47.0)
Hemoglobin: 9.5 g/dL — ABNORMAL LOW (ref 12.0–16.0)
MCH: 26.5 pg (ref 26.0–34.0)
MCHC: 32.2 g/dL (ref 32.0–36.0)
MCV: 82.1 fL (ref 80.0–100.0)
PLATELETS: 366 10*3/uL (ref 150–440)
RBC: 3.59 MIL/uL — ABNORMAL LOW (ref 3.80–5.20)
RDW: 20.7 % — AB (ref 11.5–14.5)
WBC: 7 10*3/uL (ref 3.6–11.0)

## 2016-10-02 LAB — GLUCOSE, CAPILLARY
GLUCOSE-CAPILLARY: 155 mg/dL — AB (ref 65–99)
Glucose-Capillary: 187 mg/dL — ABNORMAL HIGH (ref 65–99)
Glucose-Capillary: 236 mg/dL — ABNORMAL HIGH (ref 65–99)
Glucose-Capillary: 167 mg/dL — ABNORMAL HIGH (ref 65–99)

## 2016-10-02 MED ORDER — IOPAMIDOL (ISOVUE-300) INJECTION 61%
15.0000 mL | INTRAVENOUS | Status: AC
  Administered 2016-10-02 (×2): 15 mL via ORAL

## 2016-10-02 MED ORDER — METOPROLOL TARTRATE 25 MG PO TABS
25.0000 mg | ORAL_TABLET | Freq: Two times a day (BID) | ORAL | Status: DC
  Administered 2016-10-02 – 2016-10-05 (×6): 25 mg via ORAL
  Filled 2016-10-02 (×5): qty 1

## 2016-10-02 MED ORDER — INSULIN GLARGINE 100 UNIT/ML ~~LOC~~ SOLN
12.0000 [IU] | Freq: Every day | SUBCUTANEOUS | Status: DC
  Administered 2016-10-02 – 2016-10-04 (×3): 12 [IU] via SUBCUTANEOUS
  Filled 2016-10-02 (×4): qty 0.12

## 2016-10-02 NOTE — Progress Notes (Signed)
[place] at [facility]                                                                                                                                                                                  Patient Demographics   Jordan Brewer, is a 76 y.o. female, DOB - 12/09/1940, QMV:[id number]  Admit date - 09/28/2016   Admitting Physician Nicholes Mango, MD  Outpatient Primary MD for the patient is Tracie Harrier, MD   LOS - 4  Subjective:  Continues to have diarrhea. Still has cough  Review of Systems:   CONSTITUTIONAL: No documented fever.positive fatigue,positive weakness. No weight gain, no weight loss.  EYES: No blurry or double vision.  ENT: No tinnitus. No postnasal drip. No redness of the oropharynx.  RESPIRATORY:+cough, no wheeze, no hemoptysis. + dyspnea.  CARDIOVASCULAR: No chest pain. No orthopnea. No palpitations. No syncope.  GASTROINTESTINAL: positive nausea, no vomiting or positive diarrhea. No abdominal pain. No melena or hematochezia.  GENITOURINARY: No dysuria or hematuria.  ENDOCRINE: No polyuria or nocturia. No heat or cold intolerance.  HEMATOLOGY: No anemia. No bruising. No bleeding.  INTEGUMENTARY: No rashes. No lesions.  MUSCULOSKELETAL: No arthritis. No swelling. No gout.  NEUROLOGIC: No numbness, tingling, or ataxia. No seizure-type activity.  PSYCHIATRIC: No anxiety. No insomnia. No ADD.    Vitals:   Vitals:   10/01/16 1415 10/01/16 2020 10/02/16 0448 10/02/16 0853  BP: (!) 100/53 (!) 100/57 102/67 122/71  Pulse: 79 86 86 (!) 53  Resp:  20 20 20   Temp: 98.3 F (36.8 C) 98 F (36.7 C) 97.8 F (36.6 C) 98.2 F (36.8 C)  TempSrc: Oral Oral Oral Oral  SpO2: 94% (!) 89% 93% 95%  Weight:      Height:        Wt Readings from Last 3 Encounters:  09/28/16 157 lb (71.2 kg)  11/14/15 157 lb 11.2 oz (71.5 kg)  11/07/15 171 lb 4.8 oz (77.7 kg)     Intake/Output Summary (Last 24 hours) at 10/02/16 1248 Last data filed  at 10/02/16 1001  Gross per 24 hour  Intake           7031.5 ml  Output                0 ml  Net           7031.5 ml    Physical Exam:   GENERAL: Pleasant-appearing in no apparent distress.  HEAD, EYES, EARS, NOSE AND THROAT: Atraumatic, normocephalic. Extraocular muscles are intact. Pupils equal and reactive to light. Sclerae anicteric. No conjunctival injection. No oro-pharyngeal erythema.  NECK: Supple. There is no jugular venous distention. No  bruits, no lymphadenopathy, no thyromegaly.  HEART: Regular rate and rhythm,. No murmurs, no rubs, no clicks.  LUNGS: Clear to auscultation bilaterally. No rales or rhonchi. No wheezes.  ABDOMEN: Soft, flat, nontender, nondistended. Has good bowel sounds. No hepatosplenomegaly appreciated.  EXTREMITIES: No evidence of any cyanosis, clubbing, or peripheral edema.  +2 pedal and radial pulses bilaterally.  NEUROLOGIC: The patient is alert, awake, and oriented x3 with no focal motor or sensory deficits appreciated bilaterally.  SKIN: Moist and warm with no rashes appreciated.  Psych: Not anxious, depressed LN: No inguinal LN enlargement    Antibiotics   Anti-infectives    Start     Dose/Rate Route Frequency Ordered Stop   10/02/16 1000  fluconazole (DIFLUCAN) tablet 100 mg  Status:  Discontinued     100 mg Oral Daily 10/01/16 1116 10/01/16 1419   10/01/16 1230  doxycycline (VIBRA-TABS) tablet 100 mg     100 mg Oral Every 12 hours 10/01/16 1206     09/30/16 2045  metroNIDAZOLE (FLAGYL) IVPB 500 mg  Status:  Discontinued     500 mg 100 mL/hr over 60 Minutes Intravenous Every 8 hours 09/30/16 2037 10/01/16 1206   09/30/16 1400  metroNIDAZOLE (FLAGYL) tablet 500 mg  Status:  Discontinued     500 mg Oral Every 8 hours 09/30/16 1319 09/30/16 2037   09/29/16 1800  cefTRIAXone (ROCEPHIN) 1 g in dextrose 5 % 50 mL IVPB  Status:  Discontinued     1 g 100 mL/hr over 30 Minutes Intravenous Every 24 hours 09/28/16 1708 10/01/16 1206   09/28/16 1800   fluconazole (DIFLUCAN) tablet 200 mg  Status:  Discontinued     200 mg Oral Daily 09/28/16 1708 10/01/16 1116   09/28/16 1700  metroNIDAZOLE (FLAGYL) IVPB 500 mg  Status:  Discontinued     500 mg 100 mL/hr over 60 Minutes Intravenous Every 8 hours 09/28/16 1654 09/30/16 1319   09/28/16 1445  cefTRIAXone (ROCEPHIN) 1 g in dextrose 5 % 50 mL IVPB - Premix     1 g 100 mL/hr over 30 Minutes Intravenous  Once 09/28/16 1440 09/28/16 2348      Medications   Scheduled Meds: . amLODipine  10 mg Oral Daily  . doxycycline  100 mg Oral Q12H  . DULoxetine  60 mg Oral Daily  . enoxaparin (LOVENOX) injection  30 mg Subcutaneous Q24H  . fludrocortisone  0.2 mg Oral Daily  . hydrocortisone sod succinate (SOLU-CORTEF) inj  50 mg Intravenous Q8H  . insulin aspart  0-5 Units Subcutaneous QHS  . insulin aspart  0-9 Units Subcutaneous TID WC  . insulin glargine  8 Units Subcutaneous QHS  . loperamide  4 mg Oral Q6H  . magic mouthwash  15 mL Oral TID  . metoprolol tartrate  50 mg Oral BID  . mirtazapine  15 mg Oral QHS  . nystatin   Topical BID  . pantoprazole  40 mg Oral Daily  . pravastatin  40 mg Oral q1800   Continuous Infusions: . sodium chloride 125 mL/hr at 10/02/16 0533   PRN Meds:.acetaminophen, diphenhydrAMINE, guaiFENesin-dextromethorphan, morphine injection, ondansetron **OR** ondansetron (ZOFRAN) IV, traMADol   Data Review:   Micro Results Recent Results (from the past 240 hour(s))  Gastrointestinal Panel by PCR , Stool     Status: None   Collection Time: 09/28/16  1:44 PM  Result Value Ref Range Status   Campylobacter species NOT DETECTED NOT DETECTED Final   Plesimonas shigelloides NOT DETECTED NOT DETECTED Final  Salmonella species NOT DETECTED NOT DETECTED Final   Yersinia enterocolitica NOT DETECTED NOT DETECTED Final   Vibrio species NOT DETECTED NOT DETECTED Final   Vibrio cholerae NOT DETECTED NOT DETECTED Final   Enteroaggregative E coli (EAEC) NOT DETECTED NOT  DETECTED Final   Enteropathogenic E coli (EPEC) NOT DETECTED NOT DETECTED Final   Enterotoxigenic E coli (ETEC) NOT DETECTED NOT DETECTED Final   Shiga like toxin producing E coli (STEC) NOT DETECTED NOT DETECTED Final   Shigella/Enteroinvasive E coli (EIEC) NOT DETECTED NOT DETECTED Final   Cryptosporidium NOT DETECTED NOT DETECTED Final   Cyclospora cayetanensis NOT DETECTED NOT DETECTED Final   Entamoeba histolytica NOT DETECTED NOT DETECTED Final   Giardia lamblia NOT DETECTED NOT DETECTED Final   Adenovirus F40/41 NOT DETECTED NOT DETECTED Final   Astrovirus NOT DETECTED NOT DETECTED Final   Norovirus GI/GII NOT DETECTED NOT DETECTED Final   Rotavirus A NOT DETECTED NOT DETECTED Final   Sapovirus (I, II, IV, and V) NOT DETECTED NOT DETECTED Final  C difficile quick scan w PCR reflex     Status: None   Collection Time: 09/28/16  1:44 PM  Result Value Ref Range Status   C Diff antigen NEGATIVE NEGATIVE Final   C Diff toxin NEGATIVE NEGATIVE Final   C Diff interpretation No C. difficile detected.  Final  Urine culture     Status: Abnormal   Collection Time: 09/28/16  1:44 PM  Result Value Ref Range Status   Specimen Description URINE, CLEAN CATCH  Final   Special Requests NONE  Final   Culture >=100,000 COLONIES/mL YEAST (A)  Final   Report Status 09/30/2016 FINAL  Final    Radiology Reports Ct Abdomen Pelvis Wo Contrast  Result Date: 09/28/2016 CLINICAL DATA:  Abdominal pain, unspecified. Vomiting, diarrhea, and productive cough. EXAM: CT ABDOMEN AND PELVIS WITHOUT CONTRAST TECHNIQUE: Multidetector CT imaging of the abdomen and pelvis was performed following the standard protocol without IV contrast. COMPARISON:  10/30/2015 FINDINGS: Lower chest: Minimally seen bilateral breast calcification from implants. No acute finding. Hepatobiliary: No focal liver abnormality.No evidence of biliary obstruction or stone. Pancreas: Unremarkable. Spleen: Unremarkable. Adrenals/Urinary Tract:  Negative adrenals. No hydronephrosis or stone. Mildly indistinct bladder wall without definitive thickening. Stomach/Bowel: No obstruction. Colonic diverticulosis. Appendectomy. No bowel inflammation noted. Vascular/Lymphatic: No acute vascular abnormality. There is generalized aortic atherosclerotic calcification. No mass or adenopathy. Reproductive:Hysterectomy.  Negative adnexae. Other: No ascites or pneumoperitoneum. Musculoskeletal: Remote right obturator ring and right sacral ala fractures. Status post left hip arthroplasty. Advanced spinal degeneration, with particularly severe disc degeneration at L4-5 and L1-2. There is a transitional S1 vertebra based on the lowest ribs. IMPRESSION: 1. Possible cystitis.  Please correlate with urinalysis. 2. Colonic diverticulosis. 3.    Aortic Atherosclerosis (ICD10-I70.0). Electronically Signed   By: Monte Fantasia M.D.   On: 09/28/2016 15:49   Dg Chest 1 View  Result Date: 09/30/2016 CLINICAL DATA:  Possible aspiration when taking liquid meds . Admitted with kidney infection. Non smoker, denies heart/lung problems. EXAM: CHEST 1 VIEW COMPARISON:  09/30/2016 FINDINGS: Cardiac silhouette is normal in size. No mediastinal or hilar masses. No convincing adenopathy. Mild opacity is noted in the right lower lobe which is similar to the earlier study, but new from an exam dated 11/14/2015. This is likely atelectasis. Pneumonia or mild aspiration pneumonitis is possible. Remainder of the lungs is clear. No convincing pleural effusion. No pneumothorax. IMPRESSION: 1. No significant change from the chest radiograph obtained earlier this same date.  2. Mild right lung base opacity which could reflect pneumonia or aspiration pneumonitis but more likely atelectasis. Electronically Signed   By: Lajean Manes M.D.   On: 09/30/2016 18:24   Dg Chest 2 View  Result Date: 09/30/2016 CLINICAL DATA:  Cough EXAM: CHEST  2 VIEW COMPARISON:  11/14/2015 FINDINGS: Cardiac shadow is  enlarged. Aortic calcifications are again seen. Mild vascular congestion is noted without significant edema. Calcified breast implants are noted bilaterally. IMPRESSION: Mild vascular congestion without significant edema. Electronically Signed   By: Inez Catalina M.D.   On: 09/30/2016 15:03     CBC  Recent Labs Lab 09/28/16 1330 09/29/16 0641 09/30/16 0500 10/01/16 0350 10/02/16 0528  WBC 6.5 7.3 5.4 7.3 7.0  HGB 12.1 11.1* 9.9* 9.5* 9.5*  HCT 38.0 34.1* 31.0* 29.9* 29.5*  PLT 378 300 335 376 366  MCV 79.7* 79.6* 79.5* 80.2 82.1  MCH 25.4* 25.9* 25.3* 25.4* 26.5  MCHC 31.9* 32.5 31.9* 31.7* 32.2  RDW 19.4* 19.7* 19.6* 20.2* 20.7*  LYMPHSABS  --   --  0.4*  --   --   MONOABS  --   --  0.1*  --   --   EOSABS  --   --  0.0  --   --   BASOSABS  --   --  0.0  --   --     Chemistries   Recent Labs Lab 09/28/16 1330 09/29/16 0641 09/30/16 0500 10/01/16 0350 10/02/16 0528  NA 135 134* 138 142 145  K 3.3* 3.9 4.2 4.2 4.2  CL 98* 102 109 115* 120*  CO2 24 23 20* 20* 20*  GLUCOSE 318* 271* 244* 207* 206*  BUN 56* 50* 41* 40* 31*  CREATININE 2.75* 2.23* 2.11* 1.89* 1.60*  CALCIUM 9.3 8.5* 8.0* 7.7* 7.4*  AST 30  --   --   --   --   ALT 22  --   --   --   --   ALKPHOS 83  --   --   --   --   BILITOT 0.6  --   --   --   --    ------------------------------------------------------------------------------------------------------------------ estimated creatinine clearance is 28.5 mL/min (A) (by C-G formula based on SCr of 1.6 mg/dL (H)). ------------------------------------------------------------------------------------------------------------------ No results for input(s): HGBA1C in the last 72 hours. ------------------------------------------------------------------------------------------------------------------ No results for input(s): CHOL, HDL, LDLCALC, TRIG, CHOLHDL, LDLDIRECT in the last 72  hours. ------------------------------------------------------------------------------------------------------------------ No results for input(s): TSH, T4TOTAL, T3FREE, THYROIDAB in the last 72 hours.  Invalid input(s): FREET3 ------------------------------------------------------------------------------------------------------------------ No results for input(s): VITAMINB12, FOLATE, FERRITIN, TIBC, IRON, RETICCTPCT in the last 72 hours.  Coagulation profile No results for input(s): INR, PROTIME in the last 168 hours.  No results for input(s): DDIMER in the last 72 hours.  Cardiac Enzymes No results for input(s): CKMB, TROPONINI, MYOGLOBIN in the last 168 hours.  Invalid input(s): CK ------------------------------------------------------------------------------------------------------------------ Invalid input(s): [place]   Jordan Brewer  is a 76 y.o. 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 Patient has been vomiting and having diarrhea for 1 week. She just finished antibiotics for UTI 1 week ago   #Possible aspiration pneumonia with cough She was seen by speech continue doxycycline   #Abnormal UA with yeast Status post treatment with antifungals no further treatment needed  #Acute gastroenteritis  with nausea vomiting and diarrhea stool C. Difficile is negative as well as PCR for bacteria also negative Due to history of adrenal insufficiency continue  hydrocortisone IV- Change back to home dose Due to persistent diarrhea I will obtain a CT of the abdomen with oral contrast only   #Acute kidney injury from dehydration from nausea vomiting and diarrhea Continue IV hydrationslowly improving  #Diabetes mellitus Will provide sliding scale insulin increased dose  of Lantus to 12 units  #History of Addison's  Change back to her original regimen     Code Status Orders        Start     Ordered   09/28/16 1709  Full code  Continuous     09/28/16 1708    Code Status History    Date Active Date Inactive Code Status Order ID Comments User Context   11/14/2015  5:51 PM 11/16/2015  5:12 PM Full Code [id number]  Nicholes Mango, MD Inpatient   11/07/2015  1:31 PM 11/11/2015  6:27 PM DNR [id number]  Demetrios Loll, MD Inpatient   10/30/2015  1:20 PM 11/02/2015  5:23 PM Full Code [id number]  Hower, Aaron Mose, MD ED   11/13/2014  5:38 PM 11/16/2014  7:20 PM Full Code [id number]  Nicholes Mango, MD Inpatient   04/10/2012  6:13 PM 04/11/2012  9:57 PM Full Code [id number]  Charlynne Cousins, MD Inpatient           Consults  none  DVT Prophylaxis  Lovenox    Lab Results  Component Value Date   PLT 366 10/02/2016     Time Spent in minutes   44min  Greater than 50% of time spent in care coordination and counseling patient regarding the condition and plan of care.   Dustin Flock M.D on 10/02/2016 at 12:48 PM  Between 7am to 6pm - Pager - [phone]  After 6pm go to [email or web address] - password [email or web address] [facility]   Office  [phone]

## 2016-10-02 NOTE — Progress Notes (Signed)
PASARR has been received, [id number] E expires 11/01/2016. Per [facility] authorization is still pending.   [employer], LCSW [phone]

## 2016-10-02 NOTE — Progress Notes (Signed)
PT Cancellation Note  Patient Details Name: Rowen Hur MRN: [id number] DOB: 08-Mar-1940   Cancelled Treatment:    Reason Eval/Treat Not Completed: Other (comment)   Pt approached for therapy this pm.  Pt refusing session stating she needed to get some rest.  "I can't do it the way I feel"  Pt closed her eyes and turned away.  Will continue as appropriate.   Chesley Noon 10/02/2016, 2:01 PM

## 2016-10-03 DIAGNOSIS — R197 Diarrhea, unspecified: Secondary | ICD-10-CM

## 2016-10-03 LAB — BASIC METABOLIC PANEL
Anion gap: 5 (ref 5–15)
BUN: 28 mg/dL — AB (ref 6–20)
CHLORIDE: 122 mmol/L — AB (ref 101–111)
CO2: 16 mmol/L — AB (ref 22–32)
CREATININE: 1.41 mg/dL — AB (ref 0.44–1.00)
Calcium: 7.6 mg/dL — ABNORMAL LOW (ref 8.9–10.3)
GFR calc Af Amer: 41 mL/min — ABNORMAL LOW (ref >60)
GFR calc non Af Amer: 36 mL/min — ABNORMAL LOW (ref >60)
GLUCOSE: 232 mg/dL — AB (ref 65–99)
Potassium: 4.6 mmol/L (ref 3.5–5.1)
Sodium: 143 mmol/L (ref 135–145)

## 2016-10-03 LAB — FERRITIN: Ferritin: 10 ng/mL — ABNORMAL LOW (ref 11–307)

## 2016-10-03 LAB — GLUCOSE, CAPILLARY
GLUCOSE-CAPILLARY: 171 mg/dL — AB (ref 65–99)
Glucose-Capillary: 182 mg/dL — ABNORMAL HIGH (ref 65–99)
Glucose-Capillary: 185 mg/dL — ABNORMAL HIGH (ref 65–99)
Glucose-Capillary: 183 mg/dL — ABNORMAL HIGH (ref 65–99)

## 2016-10-03 LAB — IRON AND TIBC
Iron: 13 ug/dL — ABNORMAL LOW (ref 28–170)
SATURATION RATIOS: 4 % — AB (ref 10.4–31.8)
TIBC: 314 ug/dL (ref 250–450)
UIBC: 301 ug/dL

## 2016-10-03 LAB — VITAMIN B12: VITAMIN B 12: 958 pg/mL — AB (ref 180–914)

## 2016-10-03 LAB — FOLATE: FOLATE: 21.6 ng/mL (ref >5.9)

## 2016-10-03 MED ORDER — HYDROCORTISONE 10 MG PO TABS
20.0000 mg | ORAL_TABLET | Freq: Every day | ORAL | Status: DC
  Administered 2016-10-03 – 2016-10-05 (×3): 20 mg via ORAL
  Filled 2016-10-03 (×2): qty 2
  Filled 2016-10-03: qty 1

## 2016-10-03 MED ORDER — CHOLESTYRAMINE 4 G PO PACK
4.0000 g | PACK | Freq: Four times a day (QID) | ORAL | Status: DC
  Administered 2016-10-03 – 2016-10-05 (×8): 4 g via ORAL
  Filled 2016-10-03 (×11): qty 1

## 2016-10-03 MED ORDER — ENOXAPARIN SODIUM 40 MG/0.4ML ~~LOC~~ SOLN
40.0000 mg | SUBCUTANEOUS | Status: DC
  Administered 2016-10-03 – 2016-10-04 (×2): 40 mg via SUBCUTANEOUS
  Filled 2016-10-03 (×2): qty 0.4

## 2016-10-03 MED ORDER — DIPHENOXYLATE-ATROPINE 2.5-0.025 MG PO TABS
1.0000 | ORAL_TABLET | Freq: Three times a day (TID) | ORAL | Status: DC
  Administered 2016-10-03 – 2016-10-05 (×6): 1 via ORAL
  Filled 2016-10-03 (×6): qty 1

## 2016-10-03 MED ORDER — SODIUM CHLORIDE 0.9 % IV SOLN
INTRAVENOUS | Status: DC
  Administered 2016-10-03: 22:00:00 via INTRAVENOUS

## 2016-10-03 NOTE — Consult Note (Addendum)
Jonathon Bellows MD, MRCP(U.K) [address]  [place]  [place], [place] [address]  Main: [phone]  Fax: [phone]  Consultation  Referring Provider:  Dr Margaretmary Eddy Primary Care Physician:  Tracie Harrier, MD Primary Gastroenterologist: None         Reason for Consultation:     Diarrhea   Date of Admission:  09/28/2016 Date of Consultation:  10/03/2016         HPI:   Jordan Brewer is a 76 y.o. female she was admitted on 09/28/16 with diarrhea , nausea, vomiting of 1 week duration. She completed a course of antibiotics for an UTI a week back . On admission also had AKI. H/o or colon surgery in 2010 . She is apoor historian , says she has had diarrhea for about 2 weeks so far, lower abdominal pain  Stool studies 09/28/16- negative stool PCR and C diff. Microcytic anemia of 11.1 grams with MCV of 79. Which was noted even back in 10/2015 .Small blood seen in urine 08/2016 .  10/02/16- Ct scan of the abdomen showed no acute abdominal findings , small b/l pleural effusions.  Past Medical History:  Diagnosis Date  . Addison's disease ([place])   . Ankle fracture, left 02/2012  . Arthritis    "all over my body" (04/10/2012)  . Bipolar depression ([place])   . Cancer ([place])   . Chronic back pain    "since my hip OR in 09/2010"  . Depression   . DVT, lower extremity ([place]) 09/2010   "left; after hip replacement" (04/10/2012)  . Fall at home 02/2012   'foot got caught on heating pad cord; broke left ankle" (04/10/2012)  . GERD (gastroesophageal reflux disease)   . History of blood transfusion   . Hypercholesteremia   . Hypertension   . Syncope and collapse 04/10/2012   "w/loss of consciousness" (04/10/2012)  . Type II diabetes mellitus ([place])     Past Surgical History:  Procedure Laterality Date  . APPENDECTOMY  ~ 1949  . AUGMENTATION MAMMAPLASTY Bilateral 1970  . CATARACT EXTRACTION W/ INTRAOCULAR LENS  IMPLANT, BILATERAL Bilateral ~ 2010  . COLOSTOMY  ~ 2010  . COLOSTOMY REVERSAL  ~ 2010   "wore a bag for ~ 6 months" (04/10/2012)  . DILATION AND CURETTAGE OF UTERUS  ~ 1964   "after a miscarriage" (04/10/2012)  . JOINT REPLACEMENT    . NASAL HEMORRHAGE CONTROL  ~ 2009   "had to have blood transfusion" (04/10/2012)  . TONSILLECTOMY  ~ 1949  . TOTAL HIP ARTHROPLASTY Left 09/2010  . VAGINAL HYSTERECTOMY  1970's?    Prior to Admission medications   Medication Sig Start Date End Date Taking? Authorizing Provider  amLODipine (NORVASC) 10 MG tablet Take 10 mg by mouth daily.   Yes [provider]  docusate sodium (COLACE) 100 MG capsule Take 1 capsule (100 mg total) by mouth 2 (two) times daily. Patient taking differently: Take 100 mg by mouth 2 (two) times daily as needed for moderate constipation.  11/16/14  Yes Gladstone Lighter, MD  DULoxetine (CYMBALTA) 60 MG capsule Take 60 mg by mouth daily.   Yes [provider]  fludrocortisone (FLORINEF) 0.1 MG tablet Take 0.1 mg by mouth daily.   Yes [provider]  hydrocortisone (CORTEF) 10 MG tablet Take 10-20 mg by mouth 2 (two) times daily. 20 mg every morning and 10 mg at bedtime   Yes [provider]  insulin aspart (NOVOLOG) 100 UNIT/ML injection Inject 10-14 Units into  the skin 3 (three) times daily before meals. 10 units daily with breakfast, 12 units daily with lunch, and 14 units daily with dinner   Yes [provider]  insulin glargine (LANTUS) 100 unit/mL SOPN Inject 15 Units into the skin at bedtime.    Yes [provider]  losartan (COZAAR) 50 MG tablet Take 50 mg by mouth daily.   Yes [provider]  lovastatin (MEVACOR) 40 MG tablet Take 40 mg by mouth daily with supper.    Yes [provider]  metoprolol tartrate (LOPRESSOR) 50 MG tablet Take 50 mg by mouth 2 (two) times daily.   Yes [provider]  mirtazapine (REMERON) 15 MG tablet Take 15 mg by mouth at bedtime.   Yes [provider]  nystatin (NYSTATIN) powder Apply topically 2 (two)  times daily.   Yes [provider]  omeprazole (PRILOSEC) 40 MG capsule Take 40 mg by mouth 2 (two) times daily.   Yes [provider]    Family History  Problem Relation Age of Onset  . Hypertension Mother   . Diabetes Mellitus II Mother   . Heart attack Mother   . Lung cancer Father      Social History  Substance Use Topics  . Smoking status: Never Smoker  . Smokeless tobacco: Never Used  . Alcohol use No    Allergies as of 09/28/2016 - Review Complete 09/28/2016  Allergen Reaction Noted  . Codeine Nausea And Vomiting 11/13/2014  . Prednisone Other (See Comments) 04/10/2012  . Promethazine Nausea And Vomiting 04/10/2012  . Lidoderm [lidocaine] Rash 04/10/2012    Review of Systems:    All systems reviewed and negative except where noted in HPI.   Physical Exam:  Vital signs in last 24 hours: Temp:  [97.7 F (36.5 C)-98.4 F (36.9 C)] 98.1 F (36.7 C) (09/26 0820) Pulse Rate:  [69-85] 85 (09/26 0820) Resp:  [16-20] 20 (09/26 0820) BP: (107-125)/(56-79) 125/73 (09/26 0820) SpO2:  [90 %-95 %] 90 % (09/26 0820) Last BM Date: 10/02/16 General:   Pleasant, cooperative in NAD Head:  Normocephalic and atraumatic. Eyes:   No icterus.   Conjunctiva pink. PERRLA. Ears:  Normal auditory acuity. Neck:  Supple; no masses or thyroidomegaly Lungs: Respirations even and unlabored. Lungs clear to auscultation bilaterally.   No wheezes, crackles, or rhonchi.  Heart:  Regular rate and rhythm;  Without murmur, clicks, rubs or gallops Abdomen:  Soft, nondistended, nontender. Normal bowel sounds. No appreciable masses or hepatomegaly.  No rebound or guarding.  Rectal:  Not performed. Neurologic:  Alert and oriented x3;  grossly normal neurologically. Skin:  Intact without significant lesions or rashes. Cervical Nodes:  No significant cervical adenopathy. Psych:  Alert and cooperative. Normal affect.  LAB RESULTS:  Recent Labs  10/01/16 0350 10/02/16 0528  WBC  7.3 7.0  HGB 9.5* 9.5*  HCT 29.9* 29.5*  PLT 376 366   BMET  Recent Labs  10/01/16 0350 10/02/16 0528 10/03/16 0546  NA 142 145 143  K 4.2 4.2 4.6  CL 115* 120* 122*  CO2 20* 20* 16*  GLUCOSE 207* 206* 232*  BUN 40* 31* 28*  CREATININE 1.89* 1.60* 1.41*  CALCIUM 7.7* 7.4* 7.6*   LFT No results for input(s): PROT, ALBUMIN, AST, ALT, ALKPHOS, BILITOT, BILIDIR, IBILI in the last 72 hours. PT/INR No results for input(s): LABPROT, INR in the last 72 hours.  STUDIES: Ct Abdomen Pelvis Wo Contrast  Result Date: 10/02/2016 CLINICAL DATA:  Diarrhea. EXAM: CT  ABDOMEN AND PELVIS WITHOUT CONTRAST TECHNIQUE: Multidetector CT imaging of the abdomen and pelvis was performed following the standard protocol without IV contrast. COMPARISON:  09/28/2016 FINDINGS: Lower chest: There are small bilateral pleural effusions, right greater than left. Hepatobiliary: No focal liver abnormality is seen. No gallstones, gallbladder wall thickening, or biliary dilatation. Pancreas: Unremarkable. No pancreatic ductal dilatation or surrounding inflammatory changes. Spleen: Normal in size without focal abnormality. Adrenals/Urinary Tract: The adrenal glands are normal. No kidney stones or hydronephrosis identified. The urinary bladder appears normal. Stomach/Bowel: The stomach is normal. The small bowel loops have a normal course and caliber. There is no abnormal small bowel wall thickening or inflammation identified. Vascular/Lymphatic: Aortic atherosclerosis. No aneurysm. No abdominal adenopathy. No pelvic or inguinal adenopathy identified. Reproductive: Status post hysterectomy. No adnexal masses. Other: No abdominal wall hernia or abnormality. No abdominopelvic ascites. Musculoskeletal: Degenerative disc disease is identified within the lumbar spine. Chronic right superior and inferior pubic rami fractures. Previous left hip arthroplasty. IMPRESSION: 1. No acute findings within the abdomen or pelvis. No explanation  for patient's diarrhea. 2. Bilateral pleural effusions 3.  Aortic Atherosclerosis (ICD10-I70.0). 4. Lumbar degenerative disc disease. Electronically Signed   By: Kerby Moors M.D.   On: 10/02/2016 13:18    CBC Latest Ref Rng & Units 10/02/2016 10/01/2016 09/30/2016  WBC 3.6 - 11.0 K/uL 7.0 7.3 5.4  Hemoglobin 12.0 - 16.0 g/dL 9.5(L) 9.5(L) 9.9(L)  Hematocrit 35.0 - 47.0 % 29.5(L) 29.9(L) 31.0(L)  Platelets 150 - 440 K/uL 366 376 335     Impression / Plan:   Krisalyn Yankowski is a 76 y.o. y/o female with diarrhea of 2 weeks in duration. Recently completed a course of antibiotics for an UTI. Stool tests for infection negative 09/28/16 , no gross abnormality seen on CT scan of the abdomen.    1. Diarrhea of 2 weeks duration -Check fecal calpropectin, Imodium PRN  2. Microcytic anemia- check iron studies, b12,folate .   3. Flexible sigmoidoscopy tomorrow .   I have discussed alternative options, risks & benefits,  which include, but are not limited to, bleeding, infection, perforation,respiratory complication & drug reaction.  The patient agrees with this plan & written consent will be obtained.     Thank you for involving me in the care of this patient.      LOS: 5 days   Jonathon Bellows, MD  10/03/2016, 12:54 PM

## 2016-10-03 NOTE — Progress Notes (Signed)
[place] at [facility]                                                                                                                                                                                  Patient Demographics   Jordan Brewer, is a 76 y.o. female, DOB - 1940/10/14, TIW:[id number]  Admit date - 09/28/2016   Admitting Physician Nicholes Mango, MD  Outpatient Primary MD for the patient is Tracie Harrier, MD   LOS - 5  Subjective:   Rectal tube still in place still having diarrhea  Review of Systems:   CONSTITUTIONAL: No documented fever.positive fatigue,positive weakness. No weight gain, no weight loss.  EYES: No blurry or double vision.  ENT: No tinnitus. No postnasal drip. No redness of the oropharynx.  RESPIRATORY:+cough, no wheeze, no hemoptysis. + dyspnea.  CARDIOVASCULAR: No chest pain. No orthopnea. No palpitations. No syncope.  GASTROINTESTINAL: positive nausea, no vomiting or positive diarrhea. No abdominal pain. No melena or hematochezia.  GENITOURINARY: No dysuria or hematuria.  ENDOCRINE: No polyuria or nocturia. No heat or cold intolerance.  HEMATOLOGY: No anemia. No bruising. No bleeding.  INTEGUMENTARY: No rashes. No lesions.  MUSCULOSKELETAL: No arthritis. No swelling. No gout.  NEUROLOGIC: No numbness, tingling, or ataxia. No seizure-type activity.  PSYCHIATRIC: No anxiety. No insomnia. No ADD.    Vitals:   Vitals:   10/02/16 1302 10/02/16 2014 10/03/16 0530 10/03/16 0820  BP: 118/79 (!) 107/56 107/63 125/73  Pulse: 69 79 79 85  Resp: 16 20 18 20   Temp: 98.4 F (36.9 C) 97.7 F (36.5 C) 98.2 F (36.8 C) 98.1 F (36.7 C)  TempSrc: Oral Oral Oral Oral  SpO2: 95% 90% 90% 90%  Weight:      Height:        Wt Readings from Last 3 Encounters:  09/28/16 157 lb (71.2 kg)  11/14/15 157 lb 11.2 oz (71.5 kg)  11/07/15 171 lb 4.8 oz (77.7 kg)     Intake/Output Summary (Last 24 hours) at 10/03/16 1301 Last data filed at  10/03/16 1200  Gross per 24 hour  Intake             3138 ml  Output              650 ml  Net             2488 ml    Physical Exam:   GENERAL: Pleasant-appearing in no apparent distress.  HEAD, EYES, EARS, NOSE AND THROAT: Atraumatic, normocephalic. Extraocular muscles are intact. Pupils equal and reactive to light. Sclerae anicteric. No conjunctival injection. No oro-pharyngeal erythema.  NECK: Supple. There is no jugular venous distention.  No bruits, no lymphadenopathy, no thyromegaly.  HEART: Regular rate and rhythm,. No murmurs, no rubs, no clicks.  LUNGS: Clear to auscultation bilaterally. No rales or rhonchi. No wheezes.  ABDOMEN: Soft, flat, nontender, nondistended. Has good bowel sounds. No hepatosplenomegaly appreciated.  EXTREMITIES: No evidence of any cyanosis, clubbing, or peripheral edema.  +2 pedal and radial pulses bilaterally.  NEUROLOGIC: The patient is alert, awake, and oriented x3 with no focal motor or sensory deficits appreciated bilaterally.  SKIN: Moist and warm with no rashes appreciated.  Psych: Not anxious, depressed LN: No inguinal LN enlargement    Antibiotics   Anti-infectives    Start     Dose/Rate Route Frequency Ordered Stop   10/02/16 1000  fluconazole (DIFLUCAN) tablet 100 mg  Status:  Discontinued     100 mg Oral Daily 10/01/16 1116 10/01/16 1419   10/01/16 1230  doxycycline (VIBRA-TABS) tablet 100 mg     100 mg Oral Every 12 hours 10/01/16 1206     09/30/16 2045  metroNIDAZOLE (FLAGYL) IVPB 500 mg  Status:  Discontinued     500 mg 100 mL/hr over 60 Minutes Intravenous Every 8 hours 09/30/16 2037 10/01/16 1206   09/30/16 1400  metroNIDAZOLE (FLAGYL) tablet 500 mg  Status:  Discontinued     500 mg Oral Every 8 hours 09/30/16 1319 09/30/16 2037   09/29/16 1800  cefTRIAXone (ROCEPHIN) 1 g in dextrose 5 % 50 mL IVPB  Status:  Discontinued     1 g 100 mL/hr over 30 Minutes Intravenous Every 24 hours 09/28/16 1708 10/01/16 1206   09/28/16 1800   fluconazole (DIFLUCAN) tablet 200 mg  Status:  Discontinued     200 mg Oral Daily 09/28/16 1708 10/01/16 1116   09/28/16 1700  metroNIDAZOLE (FLAGYL) IVPB 500 mg  Status:  Discontinued     500 mg 100 mL/hr over 60 Minutes Intravenous Every 8 hours 09/28/16 1654 09/30/16 1319   09/28/16 1445  cefTRIAXone (ROCEPHIN) 1 g in dextrose 5 % 50 mL IVPB - Premix     1 g 100 mL/hr over 30 Minutes Intravenous  Once 09/28/16 1440 09/28/16 2348      Medications   Scheduled Meds: . cholestyramine  4 g Oral QID  . diphenoxylate-atropine  1 tablet Oral TID  . doxycycline  100 mg Oral Q12H  . DULoxetine  60 mg Oral Daily  . enoxaparin (LOVENOX) injection  40 mg Subcutaneous Q24H  . fludrocortisone  0.2 mg Oral Daily  . hydrocortisone sod succinate (SOLU-CORTEF) inj  50 mg Intravenous Q8H  . insulin aspart  0-5 Units Subcutaneous QHS  . insulin aspart  0-9 Units Subcutaneous TID WC  . insulin glargine  12 Units Subcutaneous QHS  . loperamide  4 mg Oral Q6H  . magic mouthwash  15 mL Oral TID  . metoprolol tartrate  25 mg Oral BID  . mirtazapine  15 mg Oral QHS  . nystatin   Topical BID  . pantoprazole  40 mg Oral Daily  . pravastatin  40 mg Oral q1800   Continuous Infusions:  PRN Meds:.acetaminophen, diphenhydrAMINE, guaiFENesin-dextromethorphan, morphine injection, ondansetron **OR** ondansetron (ZOFRAN) IV, traMADol   Data Review:   Micro Results Recent Results (from the past 240 hour(s))  Gastrointestinal Panel by PCR , Stool     Status: None   Collection Time: 09/28/16  1:44 PM  Result Value Ref Range Status   Campylobacter species NOT DETECTED NOT DETECTED Final   Plesimonas shigelloides NOT DETECTED NOT DETECTED Final  Salmonella species NOT DETECTED NOT DETECTED Final   Yersinia enterocolitica NOT DETECTED NOT DETECTED Final   Vibrio species NOT DETECTED NOT DETECTED Final   Vibrio cholerae NOT DETECTED NOT DETECTED Final   Enteroaggregative E coli (EAEC) NOT DETECTED NOT  DETECTED Final   Enteropathogenic E coli (EPEC) NOT DETECTED NOT DETECTED Final   Enterotoxigenic E coli (ETEC) NOT DETECTED NOT DETECTED Final   Shiga like toxin producing E coli (STEC) NOT DETECTED NOT DETECTED Final   Shigella/Enteroinvasive E coli (EIEC) NOT DETECTED NOT DETECTED Final   Cryptosporidium NOT DETECTED NOT DETECTED Final   Cyclospora cayetanensis NOT DETECTED NOT DETECTED Final   Entamoeba histolytica NOT DETECTED NOT DETECTED Final   Giardia lamblia NOT DETECTED NOT DETECTED Final   Adenovirus F40/41 NOT DETECTED NOT DETECTED Final   Astrovirus NOT DETECTED NOT DETECTED Final   Norovirus GI/GII NOT DETECTED NOT DETECTED Final   Rotavirus A NOT DETECTED NOT DETECTED Final   Sapovirus (I, II, IV, and V) NOT DETECTED NOT DETECTED Final  C difficile quick scan w PCR reflex     Status: None   Collection Time: 09/28/16  1:44 PM  Result Value Ref Range Status   C Diff antigen NEGATIVE NEGATIVE Final   C Diff toxin NEGATIVE NEGATIVE Final   C Diff interpretation No C. difficile detected.  Final  Urine culture     Status: Abnormal   Collection Time: 09/28/16  1:44 PM  Result Value Ref Range Status   Specimen Description URINE, CLEAN CATCH  Final   Special Requests NONE  Final   Culture >=100,000 COLONIES/mL YEAST (A)  Final   Report Status 09/30/2016 FINAL  Final    Radiology Reports Ct Abdomen Pelvis Wo Contrast  Result Date: 10/02/2016 CLINICAL DATA:  Diarrhea. EXAM: CT ABDOMEN AND PELVIS WITHOUT CONTRAST TECHNIQUE: Multidetector CT imaging of the abdomen and pelvis was performed following the standard protocol without IV contrast. COMPARISON:  09/28/2016 FINDINGS: Lower chest: There are small bilateral pleural effusions, right greater than left. Hepatobiliary: No focal liver abnormality is seen. No gallstones, gallbladder wall thickening, or biliary dilatation. Pancreas: Unremarkable. No pancreatic ductal dilatation or surrounding inflammatory changes. Spleen: Normal in  size without focal abnormality. Adrenals/Urinary Tract: The adrenal glands are normal. No kidney stones or hydronephrosis identified. The urinary bladder appears normal. Stomach/Bowel: The stomach is normal. The small bowel loops have a normal course and caliber. There is no abnormal small bowel wall thickening or inflammation identified. Vascular/Lymphatic: Aortic atherosclerosis. No aneurysm. No abdominal adenopathy. No pelvic or inguinal adenopathy identified. Reproductive: Status post hysterectomy. No adnexal masses. Other: No abdominal wall hernia or abnormality. No abdominopelvic ascites. Musculoskeletal: Degenerative disc disease is identified within the lumbar spine. Chronic right superior and inferior pubic rami fractures. Previous left hip arthroplasty. IMPRESSION: 1. No acute findings within the abdomen or pelvis. No explanation for patient's diarrhea. 2. Bilateral pleural effusions 3.  Aortic Atherosclerosis (ICD10-I70.0). 4. Lumbar degenerative disc disease. Electronically Signed   By: Kerby Moors M.D.   On: 10/02/2016 13:18   Ct Abdomen Pelvis Wo Contrast  Result Date: 09/28/2016 CLINICAL DATA:  Abdominal pain, unspecified. Vomiting, diarrhea, and productive cough. EXAM: CT ABDOMEN AND PELVIS WITHOUT CONTRAST TECHNIQUE: Multidetector CT imaging of the abdomen and pelvis was performed following the standard protocol without IV contrast. COMPARISON:  10/30/2015 FINDINGS: Lower chest: Minimally seen bilateral breast calcification from implants. No acute finding. Hepatobiliary: No focal liver abnormality.No evidence of biliary obstruction or stone. Pancreas: Unremarkable. Spleen: Unremarkable. Adrenals/Urinary Tract: Negative  adrenals. No hydronephrosis or stone. Mildly indistinct bladder wall without definitive thickening. Stomach/Bowel: No obstruction. Colonic diverticulosis. Appendectomy. No bowel inflammation noted. Vascular/Lymphatic: No acute vascular abnormality. There is generalized aortic  atherosclerotic calcification. No mass or adenopathy. Reproductive:Hysterectomy.  Negative adnexae. Other: No ascites or pneumoperitoneum. Musculoskeletal: Remote right obturator ring and right sacral ala fractures. Status post left hip arthroplasty. Advanced spinal degeneration, with particularly severe disc degeneration at L4-5 and L1-2. There is a transitional S1 vertebra based on the lowest ribs. IMPRESSION: 1. Possible cystitis.  Please correlate with urinalysis. 2. Colonic diverticulosis. 3.    Aortic Atherosclerosis (ICD10-I70.0). Electronically Signed   By: Monte Fantasia M.D.   On: 09/28/2016 15:49   Dg Chest 1 View  Result Date: 09/30/2016 CLINICAL DATA:  Possible aspiration when taking liquid meds . Admitted with kidney infection. Non smoker, denies heart/lung problems. EXAM: CHEST 1 VIEW COMPARISON:  09/30/2016 FINDINGS: Cardiac silhouette is normal in size. No mediastinal or hilar masses. No convincing adenopathy. Mild opacity is noted in the right lower lobe which is similar to the earlier study, but new from an exam dated 11/14/2015. This is likely atelectasis. Pneumonia or mild aspiration pneumonitis is possible. Remainder of the lungs is clear. No convincing pleural effusion. No pneumothorax. IMPRESSION: 1. No significant change from the chest radiograph obtained earlier this same date. 2. Mild right lung base opacity which could reflect pneumonia or aspiration pneumonitis but more likely atelectasis. Electronically Signed   By: Lajean Manes M.D.   On: 09/30/2016 18:24   Dg Chest 2 View  Result Date: 09/30/2016 CLINICAL DATA:  Cough EXAM: CHEST  2 VIEW COMPARISON:  11/14/2015 FINDINGS: Cardiac shadow is enlarged. Aortic calcifications are again seen. Mild vascular congestion is noted without significant edema. Calcified breast implants are noted bilaterally. IMPRESSION: Mild vascular congestion without significant edema. Electronically Signed   By: Inez Catalina M.D.   On: 09/30/2016 15:03      CBC  Recent Labs Lab 09/28/16 1330 09/29/16 0641 09/30/16 0500 10/01/16 0350 10/02/16 0528  WBC 6.5 7.3 5.4 7.3 7.0  HGB 12.1 11.1* 9.9* 9.5* 9.5*  HCT 38.0 34.1* 31.0* 29.9* 29.5*  PLT 378 300 335 376 366  MCV 79.7* 79.6* 79.5* 80.2 82.1  MCH 25.4* 25.9* 25.3* 25.4* 26.5  MCHC 31.9* 32.5 31.9* 31.7* 32.2  RDW 19.4* 19.7* 19.6* 20.2* 20.7*  LYMPHSABS  --   --  0.4*  --   --   MONOABS  --   --  0.1*  --   --   EOSABS  --   --  0.0  --   --   BASOSABS  --   --  0.0  --   --     Chemistries   Recent Labs Lab 09/28/16 1330 09/29/16 0641 09/30/16 0500 10/01/16 0350 10/02/16 0528 10/03/16 0546  NA 135 134* 138 142 145 143  K 3.3* 3.9 4.2 4.2 4.2 4.6  CL 98* 102 109 115* 120* 122*  CO2 24 23 20* 20* 20* 16*  GLUCOSE 318* 271* 244* 207* 206* 232*  BUN 56* 50* 41* 40* 31* 28*  CREATININE 2.75* 2.23* 2.11* 1.89* 1.60* 1.41*  CALCIUM 9.3 8.5* 8.0* 7.7* 7.4* 7.6*  AST 30  --   --   --   --   --   ALT 22  --   --   --   --   --   ALKPHOS 83  --   --   --   --   --  BILITOT 0.6  --   --   --   --   --    ------------------------------------------------------------------------------------------------------------------ estimated creatinine clearance is 32.3 mL/min (A) (by C-G formula based on SCr of 1.41 mg/dL (H)). ------------------------------------------------------------------------------------------------------------------ No results for input(s): HGBA1C in the last 72 hours. ------------------------------------------------------------------------------------------------------------------ No results for input(s): CHOL, HDL, LDLCALC, TRIG, CHOLHDL, LDLDIRECT in the last 72 hours. ------------------------------------------------------------------------------------------------------------------ No results for input(s): TSH, T4TOTAL, T3FREE, THYROIDAB in the last 72 hours.  Invalid input(s):  FREET3 ------------------------------------------------------------------------------------------------------------------ No results for input(s): VITAMINB12, FOLATE, FERRITIN, TIBC, IRON, RETICCTPCT in the last 72 hours.  Coagulation profile No results for input(s): INR, PROTIME in the last 168 hours.  No results for input(s): DDIMER in the last 72 hours.  Cardiac Enzymes No results for input(s): CKMB, TROPONINI, MYOGLOBIN in the last 168 hours.  Invalid input(s): CK ------------------------------------------------------------------------------------------------------------------ Invalid input(s): [place]   Chantrice Hagg  is a 76 y.o. 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 Patient has been vomiting and having diarrhea for 1 week. She just finished antibiotics for UTI 1 week ago   #Possible aspiration pneumonia with cough She was seen by speech continue doxycycline   #Persistent diarrhea stool C. Difficile is negative as well as PCR for bacteria also negative Due to history of adrenal insufficiency change to oral therapy as a doing at home I will place patient on Questran continue Lomotil and immodium Due to persistent diarrhea I as GI to see   #Acute kidney injury from dehydration from nausea vomiting and diarrhea improved  #Diabetes mellitus Will provide sliding scale insulin Continue Lantus  #History of Addison's  Change back to her original regimen     Code Status Orders        Start     Ordered   09/28/16 1709  Full code  Continuous     09/28/16 1708    Code Status History    Date Active Date Inactive Code Status Order ID Comments User Context   11/14/2015  5:51 PM 11/16/2015  5:12 PM Full Code [id number]  Nicholes Mango, MD Inpatient    11/07/2015  1:31 PM 11/11/2015  6:27 PM DNR [id number]  Demetrios Loll, MD Inpatient   10/30/2015  1:20 PM 11/02/2015  5:23 PM Full Code [id number]  Hower, Aaron Mose, MD ED   11/13/2014  5:38 PM 11/16/2014  7:20 PM Full Code [id number]  Nicholes Mango, MD Inpatient   04/10/2012  6:13 PM 04/11/2012  9:57 PM Full Code [id number]  Charlynne Cousins, MD Inpatient           Consults  none  DVT Prophylaxis  Lovenox    Lab Results  Component Value Date   PLT 366 10/02/2016     Time Spent in minutes   23min  Greater than 50% of time spent in care coordination and counseling patient regarding the condition and plan of care.   Dustin Flock M.D on 10/03/2016 at 1:01 PM  Between 7am to 6pm - Pager - [phone]  After 6pm go to [email or web address] - password [email or web address] [facility]   Office  [phone]

## 2016-10-03 NOTE — Progress Notes (Signed)
PT Cancellation Note  Patient Details Name: Jordan Brewer MRN: [id number] DOB: 1940/10/12   Cancelled Treatment:    Reason Eval/Treat Not Completed: Patient declined, no reason specified. Treatment attempted, pt refuses noting she is in too much pain, and just took pain medication. Pt also states she is awaiting her lunch and needs to eat since she just took pain medication. "Maybe I can try this afternoon". Re attempt at a later time/date, as the schedule allows.    Larae Grooms, PTA 10/03/2016, 12:35 PM

## 2016-10-03 NOTE — Progress Notes (Signed)
Per [facility] authorization has been received. Clinical Social Worker (CSW) made patient aware of above. Plan is for patient to D/C to [facility] when stable.   [employer], LCSW [phone]

## 2016-10-03 NOTE — Progress Notes (Addendum)
  Speech Language Pathology Treatment: Dysphagia  Patient Details Name: Jordan Brewer MRN: [id number] DOB: February 04, 1940 Today's Date: 10/03/2016 Time: 1000-1020 SLP Time Calculation (min) (ACUTE ONLY): 20 min  Assessment / Plan / Recommendation Clinical Impression  Pt seen at bedside for diet tolerance analysis. Pt presents with significant belching post all trials, regardless of consistency and report of globus sensation.  Pt observed to cough x1 with initial trial of thin liquid, however no other overt s/sx of aspiration observed with all other trials.  She does however continued to be at increased risk of aspiration of backflow/reflux given esophageal symptoms. Pt continues to present with thorough mastication, appropriate given limited dentition.  Pt reportedly tolerating dysphagia 3 diet with unrestricted liquid.    Recommend continue dysphagia 3 diet with unrestricted liquids with adherence to aspiration and reflux precautions.  Pt re-education of aspiration precautions and reflux precautions completed, to which she expressed understanding.     Pt may benefit from GI consult to address chronic esophageal symptoms at time of D/C.   HPI HPI: Pt is a 77 y.o. female with a known history of Addison's disease on Florinef, hypertension, hyperlipidemia,, diabetes, GERD, Bipolar depression, chronic back pain, and other medical problems is presenting to the ED with a chief complaint of diarrhea as well as nausea and vomiting. Patient has been vomiting and having diarrhea for 1+ weeks and stated she had not been eating or drinking "much at all". She just finished antibiotics for UTI 1 week ago. Reporting diffuse abdominal pain. Pt was admitted w/ acute cystitis and Acute gastroenteritis with nausea vomiting and diarrhea. She has been on a clear liquid diet initially since admission.  Pt appears to present with improved LOA/confusion from previous notes.  She reports hx of GERD including belching with all  consistencies and globus sensation.   Pt pleasant and agreeable to SLP tx at bedside addressing diet tolerance analysis.      SLP Plan  Continue with current plan of care;Consult other service (comment) (Pt may benefit from GI consult)       Recommendations  Diet recommendations: Dysphagia 3 (mechanical soft) Liquids provided via: Cup;Straw Medication Administration: Whole meds with puree Supervision: Patient able to self feed Compensations: Small sips/bites;Follow solids with liquid;Effortful swallow Postural Changes and/or Swallow Maneuvers: Seated upright 90 degrees;Upright 30-60 min after meal               Oral Care Recommendations: Oral care BID;Patient independent with oral care;Staff/trained caregiver to provide oral care Follow up Recommendations: None SLP Visit Diagnosis: Dysphagia, oropharyngeal phase (R13.12) Plan: Continue with current plan of care;Consult other service (comment) (Pt may benefit from GI consult)       GO                Jordan Brewer 10/03/2016, 11:48 AM

## 2016-10-04 ENCOUNTER — Encounter
Admission: EM | Disposition: A | Discharge status: Skilled Nursing Facility | Payer: Self-pay | Source: Home / Self Care | Attending: Internal Medicine

## 2016-10-04 ENCOUNTER — Inpatient Hospital Stay: Payer: Medicare HMO

## 2016-10-04 LAB — BASIC METABOLIC PANEL
Anion gap: 6 (ref 5–15)
BUN: 23 mg/dL — AB (ref 6–20)
CALCIUM: 8.3 mg/dL — AB (ref 8.9–10.3)
CO2: 18 mmol/L — ABNORMAL LOW (ref 22–32)
CREATININE: 1.19 mg/dL — AB (ref 0.44–1.00)
Chloride: 120 mmol/L — ABNORMAL HIGH (ref 101–111)
GFR, EST AFRICAN AMERICAN: 51 mL/min — AB (ref >60)
GFR, EST NON AFRICAN AMERICAN: 44 mL/min — AB (ref >60)
Glucose, Bld: 160 mg/dL — ABNORMAL HIGH (ref 65–99)
Potassium: 4.4 mmol/L (ref 3.5–5.1)
SODIUM: 144 mmol/L (ref 135–145)

## 2016-10-04 LAB — GLUCOSE, CAPILLARY
GLUCOSE-CAPILLARY: 143 mg/dL — AB (ref 65–99)
GLUCOSE-CAPILLARY: 94 mg/dL (ref 65–99)
Glucose-Capillary: 182 mg/dL — ABNORMAL HIGH (ref 65–99)
Glucose-Capillary: 157 mg/dL — ABNORMAL HIGH (ref 65–99)

## 2016-10-04 SURGERY — SIGMOIDOSCOPY, FLEXIBLE
Anesthesia: General

## 2016-10-04 MED ORDER — MAGIC MOUTHWASH
10.0000 mL | Freq: Three times a day (TID) | ORAL | Status: DC
  Administered 2016-10-04 – 2016-10-05 (×3): 10 mL via ORAL
  Filled 2016-10-04 (×2): qty 10

## 2016-10-04 MED ORDER — IPRATROPIUM-ALBUTEROL 0.5-2.5 (3) MG/3ML IN SOLN
3.0000 mL | Freq: Four times a day (QID) | RESPIRATORY_TRACT | Status: DC
  Administered 2016-10-04 – 2016-10-05 (×5): 3 mL via RESPIRATORY_TRACT
  Filled 2016-10-04 (×5): qty 3

## 2016-10-04 MED ORDER — FUROSEMIDE 10 MG/ML IJ SOLN
40.0000 mg | Freq: Two times a day (BID) | INTRAMUSCULAR | Status: DC
  Administered 2016-10-04 – 2016-10-05 (×2): 40 mg via INTRAVENOUS
  Filled 2016-10-04 (×2): qty 4

## 2016-10-04 MED ORDER — FUROSEMIDE 10 MG/ML IJ SOLN
40.0000 mg | Freq: Once | INTRAMUSCULAR | Status: AC
  Administered 2016-10-04: 40 mg via INTRAVENOUS
  Filled 2016-10-04: qty 4

## 2016-10-04 MED ORDER — FUROSEMIDE 10 MG/ML IJ SOLN: 20.0000 mg | Freq: Once | INTRAMUSCULAR | Status: DC

## 2016-10-04 NOTE — Progress Notes (Signed)
Pnt resting. Overnight no new issues or concerns. Rectal tube in place minimal output. Placed PT and OT consult. Pnt has not been out of bed since admission. Pnt has generalized weakness. Pnt has been stating she is going to die this week. This RN asked pnt why she felt that way? Pnt states she just want to. Encouraged pnt to have positive thoughts, will pass on to day nurse to inform MD's pnt may need psyc consult. Pnt currently resting and appears comfortable. Will continue to monitor and assess.

## 2016-10-04 NOTE — Progress Notes (Signed)
PT Cancellation Note  Patient Details Name: Jordan Brewer MRN: [id number] DOB: 1940/12/14   Cancelled Treatment:    Reason Eval/Treat Not Completed: Other (comment)   Attempted session this am.  Pt with nursing.  Will attempt again this pm as time allows.   Chesley Noon 10/04/2016, 12:03 PM

## 2016-10-04 NOTE — Progress Notes (Signed)
[place] at [facility]                                                                                                                                                                                  Patient Demographics   Jordan Brewer, is a 76 y.o. female, DOB - 04/20/1940, DPO:[id number]  Admit date - 09/28/2016   Admitting Physician Nicholes Mango, MD  Outpatient Primary MD for the patient is Tracie Harrier, MD   LOS - 6  Subjective: Patient was supposed to have sigmoidoscopy with biopsy and endoscopy, owever she is complaining of shortness of breath and oxygen sats little low. Diarrhea is mostly resolved  Review of Systems:   CONSTITUTIONAL: No documented fever.positive fatigue,positive weakness. No weight gain, no weight loss.  EYES: No blurry or double vision.  ENT: No tinnitus. No postnasal drip. No redness of the oropharynx.  RESPIRATORY:+cough, no wheeze, no hemoptysis. + dyspnea.  CARDIOVASCULAR: No chest pain. No orthopnea. No palpitations. No syncope.  GASTROINTESTINAL: positive nausea, no vomiting or positive diarrhea. No abdominal pain. No melena or hematochezia.  GENITOURINARY: No dysuria or hematuria.  ENDOCRINE: No polyuria or nocturia. No heat or cold intolerance.  HEMATOLOGY: No anemia. No bruising. No bleeding.  INTEGUMENTARY: No rashes. No lesions.  MUSCULOSKELETAL: No arthritis. No swelling. No gout.  NEUROLOGIC: No numbness, tingling, or ataxia. No seizure-type activity.  PSYCHIATRIC: No anxiety. No insomnia. No ADD.    Vitals:   Vitals:   10/04/16 0809 10/04/16 0815 10/04/16 1051 10/04/16 1100  BP: 128/63     Pulse: 89     Resp: 20     Temp: 97.8 F (36.6 C)     TempSrc: Oral     SpO2: (!) 87% 92% 92% 92%  Weight:      Height:        Wt Readings from Last 3 Encounters:  09/28/16 157 lb (71.2 kg)  11/14/15 157 lb 11.2 oz (71.5 kg)  11/07/15 171 lb 4.8 oz (77.7 kg)     Intake/Output Summary (Last 24 hours) at  10/04/16 1326 Last data filed at 10/04/16 1059  Gross per 24 hour  Intake           302.67 ml  Output              100 ml  Net           202.67 ml    Physical Exam:   GENERAL: Pleasant-appearing in no apparent distress.  HEAD, EYES, EARS, NOSE AND THROAT: Atraumatic, normocephalic. Extraocular muscles are intact. Pupils equal and reactive to light. Sclerae anicteric. No conjunctival injection. No oro-pharyngeal erythema.  NECK: Supple. There is  no jugular venous distention. No bruits, no lymphadenopathy, no thyromegaly.  HEART: Regular rate and rhythm,. No murmurs, no rubs, no clicks.  LUNGS: occasional crackles and wheezing ABDOMEN: Soft, flat, nontender, nondistended. Has good bowel sounds. No hepatosplenomegaly appreciated.  EXTREMITIES: No evidence of any cyanosis, clubbing, or peripheral edema.  +2 pedal and radial pulses bilaterally.  NEUROLOGIC: The patient is alert, awake, and oriented x3 with no focal motor or sensory deficits appreciated bilaterally.  SKIN: Moist and warm with no rashes appreciated.  Psych: Not anxious, depressed LN: No inguinal LN enlargement    Antibiotics   Anti-infectives    Start     Dose/Rate Route Frequency Ordered Stop   10/02/16 1000  fluconazole (DIFLUCAN) tablet 100 mg  Status:  Discontinued     100 mg Oral Daily 10/01/16 1116 10/01/16 1419   10/01/16 1230  doxycycline (VIBRA-TABS) tablet 100 mg     100 mg Oral Every 12 hours 10/01/16 1206     09/30/16 2045  metroNIDAZOLE (FLAGYL) IVPB 500 mg  Status:  Discontinued     500 mg 100 mL/hr over 60 Minutes Intravenous Every 8 hours 09/30/16 2037 10/01/16 1206   09/30/16 1400  metroNIDAZOLE (FLAGYL) tablet 500 mg  Status:  Discontinued     500 mg Oral Every 8 hours 09/30/16 1319 09/30/16 2037   09/29/16 1800  cefTRIAXone (ROCEPHIN) 1 g in dextrose 5 % 50 mL IVPB  Status:  Discontinued     1 g 100 mL/hr over 30 Minutes Intravenous Every 24 hours 09/28/16 1708 10/01/16 1206   09/28/16 1800   fluconazole (DIFLUCAN) tablet 200 mg  Status:  Discontinued     200 mg Oral Daily 09/28/16 1708 10/01/16 1116   09/28/16 1700  metroNIDAZOLE (FLAGYL) IVPB 500 mg  Status:  Discontinued     500 mg 100 mL/hr over 60 Minutes Intravenous Every 8 hours 09/28/16 1654 09/30/16 1319   09/28/16 1445  cefTRIAXone (ROCEPHIN) 1 g in dextrose 5 % 50 mL IVPB - Premix     1 g 100 mL/hr over 30 Minutes Intravenous  Once 09/28/16 1440 09/28/16 2348      Medications   Scheduled Meds: . cholestyramine  4 g Oral QID  . diphenoxylate-atropine  1 tablet Oral TID  . doxycycline  100 mg Oral Q12H  . DULoxetine  60 mg Oral Daily  . enoxaparin (LOVENOX) injection  40 mg Subcutaneous Q24H  . fludrocortisone  0.2 mg Oral Daily  . hydrocortisone  20 mg Oral Daily  . insulin aspart  0-5 Units Subcutaneous QHS  . insulin aspart  0-9 Units Subcutaneous TID WC  . insulin glargine  12 Units Subcutaneous QHS  . ipratropium-albuterol  3 mL Nebulization Q6H  . loperamide  4 mg Oral Q6H  . magic mouthwash  15 mL Oral TID  . metoprolol tartrate  25 mg Oral BID  . mirtazapine  15 mg Oral QHS  . nystatin   Topical BID  . pantoprazole  40 mg Oral Daily  . pravastatin  40 mg Oral q1800   Continuous Infusions:  PRN Meds:.acetaminophen, diphenhydrAMINE, guaiFENesin-dextromethorphan, morphine injection, ondansetron **OR** ondansetron (ZOFRAN) IV, traMADol   Data Review:   Micro Results Recent Results (from the past 240 hour(s))  Gastrointestinal Panel by PCR , Stool     Status: None   Collection Time: 09/28/16  1:44 PM  Result Value Ref Range Status   Campylobacter species NOT DETECTED NOT DETECTED Final   Plesimonas shigelloides NOT DETECTED NOT DETECTED Final  Salmonella species NOT DETECTED NOT DETECTED Final   Yersinia enterocolitica NOT DETECTED NOT DETECTED Final   Vibrio species NOT DETECTED NOT DETECTED Final   Vibrio cholerae NOT DETECTED NOT DETECTED Final   Enteroaggregative E coli (EAEC) NOT  DETECTED NOT DETECTED Final   Enteropathogenic E coli (EPEC) NOT DETECTED NOT DETECTED Final   Enterotoxigenic E coli (ETEC) NOT DETECTED NOT DETECTED Final   Shiga like toxin producing E coli (STEC) NOT DETECTED NOT DETECTED Final   Shigella/Enteroinvasive E coli (EIEC) NOT DETECTED NOT DETECTED Final   Cryptosporidium NOT DETECTED NOT DETECTED Final   Cyclospora cayetanensis NOT DETECTED NOT DETECTED Final   Entamoeba histolytica NOT DETECTED NOT DETECTED Final   Giardia lamblia NOT DETECTED NOT DETECTED Final   Adenovirus F40/41 NOT DETECTED NOT DETECTED Final   Astrovirus NOT DETECTED NOT DETECTED Final   Norovirus GI/GII NOT DETECTED NOT DETECTED Final   Rotavirus A NOT DETECTED NOT DETECTED Final   Sapovirus (I, II, IV, and V) NOT DETECTED NOT DETECTED Final  C difficile quick scan w PCR reflex     Status: None   Collection Time: 09/28/16  1:44 PM  Result Value Ref Range Status   C Diff antigen NEGATIVE NEGATIVE Final   C Diff toxin NEGATIVE NEGATIVE Final   C Diff interpretation No C. difficile detected.  Final  Urine culture     Status: Abnormal   Collection Time: 09/28/16  1:44 PM  Result Value Ref Range Status   Specimen Description URINE, CLEAN CATCH  Final   Special Requests NONE  Final   Culture >=100,000 COLONIES/mL YEAST (A)  Final   Report Status 09/30/2016 FINAL  Final    Radiology Reports Ct Abdomen Pelvis Wo Contrast  Result Date: 10/02/2016 CLINICAL DATA:  Diarrhea. EXAM: CT ABDOMEN AND PELVIS WITHOUT CONTRAST TECHNIQUE: Multidetector CT imaging of the abdomen and pelvis was performed following the standard protocol without IV contrast. COMPARISON:  09/28/2016 FINDINGS: Lower chest: There are small bilateral pleural effusions, right greater than left. Hepatobiliary: No focal liver abnormality is seen. No gallstones, gallbladder wall thickening, or biliary dilatation. Pancreas: Unremarkable. No pancreatic ductal dilatation or surrounding inflammatory changes.  Spleen: Normal in size without focal abnormality. Adrenals/Urinary Tract: The adrenal glands are normal. No kidney stones or hydronephrosis identified. The urinary bladder appears normal. Stomach/Bowel: The stomach is normal. The small bowel loops have a normal course and caliber. There is no abnormal small bowel wall thickening or inflammation identified. Vascular/Lymphatic: Aortic atherosclerosis. No aneurysm. No abdominal adenopathy. No pelvic or inguinal adenopathy identified. Reproductive: Status post hysterectomy. No adnexal masses. Other: No abdominal wall hernia or abnormality. No abdominopelvic ascites. Musculoskeletal: Degenerative disc disease is identified within the lumbar spine. Chronic right superior and inferior pubic rami fractures. Previous left hip arthroplasty. IMPRESSION: 1. No acute findings within the abdomen or pelvis. No explanation for patient's diarrhea. 2. Bilateral pleural effusions 3.  Aortic Atherosclerosis (ICD10-I70.0). 4. Lumbar degenerative disc disease. Electronically Signed   By: Kerby Moors M.D.   On: 10/02/2016 13:18   Ct Abdomen Pelvis Wo Contrast  Result Date: 09/28/2016 CLINICAL DATA:  Abdominal pain, unspecified. Vomiting, diarrhea, and productive cough. EXAM: CT ABDOMEN AND PELVIS WITHOUT CONTRAST TECHNIQUE: Multidetector CT imaging of the abdomen and pelvis was performed following the standard protocol without IV contrast. COMPARISON:  10/30/2015 FINDINGS: Lower chest: Minimally seen bilateral breast calcification from implants. No acute finding. Hepatobiliary: No focal liver abnormality.No evidence of biliary obstruction or stone. Pancreas: Unremarkable. Spleen: Unremarkable. Adrenals/Urinary Tract: Negative  adrenals. No hydronephrosis or stone. Mildly indistinct bladder wall without definitive thickening. Stomach/Bowel: No obstruction. Colonic diverticulosis. Appendectomy. No bowel inflammation noted. Vascular/Lymphatic: No acute vascular abnormality. There is  generalized aortic atherosclerotic calcification. No mass or adenopathy. Reproductive:Hysterectomy.  Negative adnexae. Other: No ascites or pneumoperitoneum. Musculoskeletal: Remote right obturator ring and right sacral ala fractures. Status post left hip arthroplasty. Advanced spinal degeneration, with particularly severe disc degeneration at L4-5 and L1-2. There is a transitional S1 vertebra based on the lowest ribs. IMPRESSION: 1. Possible cystitis.  Please correlate with urinalysis. 2. Colonic diverticulosis. 3.    Aortic Atherosclerosis (ICD10-I70.0). Electronically Signed   By: Monte Fantasia M.D.   On: 09/28/2016 15:49   Dg Chest 1 View  Result Date: 09/30/2016 CLINICAL DATA:  Possible aspiration when taking liquid meds . Admitted with kidney infection. Non smoker, denies heart/lung problems. EXAM: CHEST 1 VIEW COMPARISON:  09/30/2016 FINDINGS: Cardiac silhouette is normal in size. No mediastinal or hilar masses. No convincing adenopathy. Mild opacity is noted in the right lower lobe which is similar to the earlier study, but new from an exam dated 11/14/2015. This is likely atelectasis. Pneumonia or mild aspiration pneumonitis is possible. Remainder of the lungs is clear. No convincing pleural effusion. No pneumothorax. IMPRESSION: 1. No significant change from the chest radiograph obtained earlier this same date. 2. Mild right lung base opacity which could reflect pneumonia or aspiration pneumonitis but more likely atelectasis. Electronically Signed   By: Lajean Manes M.D.   On: 09/30/2016 18:24   Dg Chest 2 View  Result Date: 09/30/2016 CLINICAL DATA:  Cough EXAM: CHEST  2 VIEW COMPARISON:  11/14/2015 FINDINGS: Cardiac shadow is enlarged. Aortic calcifications are again seen. Mild vascular congestion is noted without significant edema. Calcified breast implants are noted bilaterally. IMPRESSION: Mild vascular congestion without significant edema. Electronically Signed   By: Inez Catalina M.D.    On: 09/30/2016 15:03   Dg Chest Port 1 View  Result Date: 10/04/2016 CLINICAL DATA:  Shortness of breath. EXAM: PORTABLE CHEST 1 VIEW COMPARISON:  09/30/2016. FINDINGS: Mediastinum and hilar structures normal. Cardiomegaly with normal pulmonary vascularity. Mild bilateral pulmonary interstitial prominence noted consistent with CHF. Interim partial clearing of right base atelectasis. No pleural effusion or pneumothorax. Bilateral breast implants. No acute bony abnormality. IMPRESSION: 1. Cardiomegaly with mild bilateral pulmonary interstitial prominence. Mild CHF cannot be excluded. Mild pneumonitis cannot be excluded. 2.  Interim partial clearing of right base atelectasis . Electronically Signed   By: Marcello Moores  Register   On: 10/04/2016 13:18     CBC  Recent Labs Lab 09/28/16 1330 09/29/16 0641 09/30/16 0500 10/01/16 0350 10/02/16 0528  WBC 6.5 7.3 5.4 7.3 7.0  HGB 12.1 11.1* 9.9* 9.5* 9.5*  HCT 38.0 34.1* 31.0* 29.9* 29.5*  PLT 378 300 335 376 366  MCV 79.7* 79.6* 79.5* 80.2 82.1  MCH 25.4* 25.9* 25.3* 25.4* 26.5  MCHC 31.9* 32.5 31.9* 31.7* 32.2  RDW 19.4* 19.7* 19.6* 20.2* 20.7*  LYMPHSABS  --   --  0.4*  --   --   MONOABS  --   --  0.1*  --   --   EOSABS  --   --  0.0  --   --   BASOSABS  --   --  0.0  --   --     Chemistries   Recent Labs Lab 09/28/16 1330  09/30/16 0500 10/01/16 0350 10/02/16 0528 10/03/16 0546 10/04/16 0714  NA 135  < > 138 142 145  143 144  K 3.3*  < > 4.2 4.2 4.2 4.6 4.4  CL 98*  < > 109 115* 120* 122* 120*  CO2 24  < > 20* 20* 20* 16* 18*  GLUCOSE 318*  < > 244* 207* 206* 232* 160*  BUN 56*  < > 41* 40* 31* 28* 23*  CREATININE 2.75*  < > 2.11* 1.89* 1.60* 1.41* 1.19*  CALCIUM 9.3  < > 8.0* 7.7* 7.4* 7.6* 8.3*  AST 30  --   --   --   --   --   --   ALT 22  --   --   --   --   --   --   ALKPHOS 83  --   --   --   --   --   --   BILITOT 0.6  --   --   --   --   --   --   < > = values in this interval not  displayed. ------------------------------------------------------------------------------------------------------------------ estimated creatinine clearance is 38.3 mL/min (A) (by C-G formula based on SCr of 1.19 mg/dL (H)). ------------------------------------------------------------------------------------------------------------------ No results for input(s): HGBA1C in the last 72 hours. ------------------------------------------------------------------------------------------------------------------ No results for input(s): CHOL, HDL, LDLCALC, TRIG, CHOLHDL, LDLDIRECT in the last 72 hours. ------------------------------------------------------------------------------------------------------------------ No results for input(s): TSH, T4TOTAL, T3FREE, THYROIDAB in the last 72 hours.  Invalid input(s): FREET3 ------------------------------------------------------------------------------------------------------------------  Recent Labs  10/03/16 0933 10/03/16 1548  VITAMINB12  --  958*  FOLATE 21.6  --   FERRITIN 10*  --   TIBC 314  --   IRON 13*  --     Coagulation profile No results for input(s): INR, PROTIME in the last 168 hours.  No results for input(s): DDIMER in the last 72 hours.  Cardiac Enzymes No results for input(s): CKMB, TROPONINI, MYOGLOBIN in the last 168 hours.  Invalid input(s): CK ------------------------------------------------------------------------------------------------------------------ Invalid input(s): [place]   Ronesha Heenan  is a 76 y.o. 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 Patient has been vomiting and having diarrhea for 1 week. She just finished antibiotics for UTI 1 week ago  #hypoxia with shortness of breath I suspect this is  due to fluid overload I will give her a dose of Lasix Use nebs as needed    #Possible aspiration pneumonia with cough She was seen by speech continue doxycycline   #diarrhea stool C. Difficile is negative as well as PCR for bacteria also negative Due to history of adrenal insufficiency change to oral therapy as a doing at home continue Questran continue Lomotil and immodium-diarrhea improved Appreciate GI input   #Acute kidney injury from dehydration from nausea vomiting and diarrhea improved  #Diabetes mellitus Will provide sliding scale insulin Continue Lantus  #History of Addison's  Patient back on her usual home regimen     Code Status Orders        Start     Ordered   09/28/16 1709  Full code  Continuous     09/28/16 1708    Code Status History    Date Active Date Inactive Code Status Order ID Comments User Context   11/14/2015  5:51 PM 11/16/2015  5:12 PM Full Code [id number]  Nicholes Mango, MD Inpatient   11/07/2015  1:31 PM 11/11/2015  6:27 PM DNR [id number]  Bridgett Larsson,  Sheppard Evens, MD Inpatient   10/30/2015  1:20 PM 11/02/2015  5:23 PM Full Code [id number]  Lytle Butte, MD ED   11/13/2014  5:38 PM 11/16/2014  7:20 PM Full Code [id number]  Nicholes Mango, MD Inpatient   04/10/2012  6:13 PM 04/11/2012  9:57 PM Full Code [id number]  Charlynne Cousins, MD Inpatient           Consults  none  DVT Prophylaxis  Lovenox    Lab Results  Component Value Date   PLT 366 10/02/2016     Time Spent in minutes   71min  Greater than 50% of time spent in care coordination and counseling patient regarding the condition and plan of care.   Dustin Flock M.D on 10/04/2016 at 1:26 PM  Between 7am to 6pm - Pager - [phone]  After 6pm go to [email or web address] - password [email or web address] [facility]   Office  [phone]

## 2016-10-04 NOTE — Progress Notes (Signed)
Patient was supposed to have a flexible sigmoidoscopy but unfortunately she has been desaturating and needed additional oxygen , will cancell procedure today and reasses tomorrow. If diarrhea has resolved , does not need the procedure.   Dr Jonathon Bellows MD,MRCP [facility]) Gastroenterology/Hepatology Pager: [phone]

## 2016-10-04 NOTE — Progress Notes (Signed)
PT Cancellation Note  Patient Details Name: Jordan Brewer MRN: [id number] DOB: Oct 09, 1940   Cancelled Treatment:    Reason Eval/Treat Not Completed: Patient declined, no reason specified. Treatment attempted this afternoon. Pt notes she is on the bed pan, but is finished and needs the nurse. Pt states she want off the bed pan, but is not agreeable to any PT this afternoon despite encouragement and offering to wait until pt off of the bed pan. Nursing assistant called on pt's behalf. Re attempt at a later date.    Larae Grooms, PTA 10/04/2016, 4:34 PM

## 2016-10-04 NOTE — Progress Notes (Signed)
Medications administered by student RN [id number] with supervision of Clinical Instructor Eboney Claybrook MSN, RN-BC or patient's assigned RN.   

## 2016-10-04 NOTE — Care Management (Signed)
RNCM consult for [facility] needs.  Anticipated discharge to SNF.  CSW facilitating.  RNCM signing off.  Please re consult if indicated.

## 2016-10-04 NOTE — Progress Notes (Signed)
Per night shift RN  Margarette Canada,   patient told her she thinks she is going to die soon

## 2016-10-05 DIAGNOSIS — R1312 Dysphagia, oropharyngeal phase: Secondary | ICD-10-CM | POA: Diagnosis not present

## 2016-10-05 DIAGNOSIS — D649 Anemia, unspecified: Secondary | ICD-10-CM | POA: Diagnosis not present

## 2016-10-05 DIAGNOSIS — F3289 Other specified depressive episodes: Secondary | ICD-10-CM | POA: Diagnosis not present

## 2016-10-05 DIAGNOSIS — E785 Hyperlipidemia, unspecified: Secondary | ICD-10-CM | POA: Diagnosis not present

## 2016-10-05 DIAGNOSIS — E8770 Fluid overload, unspecified: Secondary | ICD-10-CM | POA: Diagnosis not present

## 2016-10-05 DIAGNOSIS — I10 Essential (primary) hypertension: Secondary | ICD-10-CM | POA: Diagnosis not present

## 2016-10-05 DIAGNOSIS — E271 Primary adrenocortical insufficiency: Secondary | ICD-10-CM | POA: Diagnosis not present

## 2016-10-05 DIAGNOSIS — M6281 Muscle weakness (generalized): Secondary | ICD-10-CM | POA: Diagnosis not present

## 2016-10-05 DIAGNOSIS — R0600 Dyspnea, unspecified: Secondary | ICD-10-CM | POA: Diagnosis not present

## 2016-10-05 DIAGNOSIS — E1140 Type 2 diabetes mellitus with diabetic neuropathy, unspecified: Secondary | ICD-10-CM | POA: Diagnosis not present

## 2016-10-05 DIAGNOSIS — E119 Type 2 diabetes mellitus without complications: Secondary | ICD-10-CM | POA: Diagnosis not present

## 2016-10-05 DIAGNOSIS — F419 Anxiety disorder, unspecified: Secondary | ICD-10-CM | POA: Diagnosis not present

## 2016-10-05 DIAGNOSIS — N179 Acute kidney failure, unspecified: Secondary | ICD-10-CM | POA: Diagnosis not present

## 2016-10-05 DIAGNOSIS — J690 Pneumonitis due to inhalation of food and vomit: Secondary | ICD-10-CM | POA: Diagnosis not present

## 2016-10-05 DIAGNOSIS — Z9911 Dependence on respirator [ventilator] status: Secondary | ICD-10-CM | POA: Diagnosis not present

## 2016-10-05 DIAGNOSIS — Z5189 Encounter for other specified aftercare: Secondary | ICD-10-CM | POA: Diagnosis not present

## 2016-10-05 DIAGNOSIS — Z794 Long term (current) use of insulin: Secondary | ICD-10-CM | POA: Diagnosis not present

## 2016-10-05 DIAGNOSIS — R197 Diarrhea, unspecified: Secondary | ICD-10-CM | POA: Diagnosis not present

## 2016-10-05 DIAGNOSIS — F3176 Bipolar disorder, in full remission, most recent episode depressed: Secondary | ICD-10-CM | POA: Diagnosis not present

## 2016-10-05 DIAGNOSIS — J189 Pneumonia, unspecified organism: Secondary | ICD-10-CM | POA: Diagnosis not present

## 2016-10-05 LAB — CREATININE, SERUM
Creatinine, Ser: 0.95 mg/dL (ref 0.44–1.00)
GFR calc non Af Amer: 58 mL/min — ABNORMAL LOW (ref >60)

## 2016-10-05 LAB — GLUCOSE, CAPILLARY
GLUCOSE-CAPILLARY: 142 mg/dL — AB (ref 65–99)
Glucose-Capillary: 181 mg/dL — ABNORMAL HIGH (ref 65–99)

## 2016-10-05 MED ORDER — TRAMADOL HCL 50 MG PO TABS: 50.0000 mg | ORAL_TABLET | Freq: Four times a day (QID) | ORAL | 0 refills | Status: AC | PRN

## 2016-10-05 MED ORDER — CHOLESTYRAMINE 4 G PO PACK: 4.0000 g | PACK | Freq: Four times a day (QID) | ORAL | 0 refills | Status: AC

## 2016-10-05 MED ORDER — ONDANSETRON HCL 4 MG PO TABS: 4.0000 mg | ORAL_TABLET | Freq: Four times a day (QID) | ORAL | 0 refills | Status: AC | PRN

## 2016-10-05 MED ORDER — IPRATROPIUM-ALBUTEROL 0.5-2.5 (3) MG/3ML IN SOLN: 3.0000 mL | Freq: Four times a day (QID) | RESPIRATORY_TRACT | Status: AC | PRN

## 2016-10-05 MED ORDER — GUAIFENESIN-DM 100-10 MG/5ML PO SYRP: 10.0000 mL | ORAL_SOLUTION | ORAL | 0 refills | Status: AC | PRN

## 2016-10-05 MED ORDER — TRAMADOL HCL 50 MG PO TABS: 50.0000 mg | ORAL_TABLET | Freq: Four times a day (QID) | ORAL | 0 refills | Status: DC | PRN

## 2016-10-05 MED ORDER — DIPHENOXYLATE-ATROPINE 2.5-0.025 MG PO TABS: 1.0000 | ORAL_TABLET | Freq: Three times a day (TID) | ORAL | 0 refills | Status: AC

## 2016-10-05 MED ORDER — MAGIC MOUTHWASH: 10.0000 mL | Freq: Three times a day (TID) | ORAL | 0 refills | Status: AC

## 2016-10-05 MED ORDER — DOXYCYCLINE MONOHYDRATE 100 MG PO CAPS: 100.0000 mg | ORAL_CAPSULE | Freq: Two times a day (BID) | ORAL | 0 refills | Status: AC

## 2016-10-05 NOTE — Progress Notes (Signed)
EMS called for transport to [facility]. Cymone Yeske S, RN  

## 2016-10-05 NOTE — Progress Notes (Signed)
Patient is medically stable for D/C to [facility] today. Per [facility] liaison patient can come today to room 709. [employer] authorization has been received. RN will call report to RN Yaakov Guthrie at [phone] and arrange EMS for transport. Clinical Education officer, museum (CSW) sent D/C orders to [facility] via [facility]. Patient is aware of above. CSW contacted patient's son Edd Arbour and left him a voicemail making him aware of above. CSW attempted to contact patient's daughter Lattie Haw however she did not answer and a voicemail was left. Please reconsult if future social work needs arise. CSW signing off.   [employer], LCSW [phone]

## 2016-10-05 NOTE — Care Management Important Message (Signed)
Important Message  Patient Details  Name: Jordan Brewer MRN: [id number] Date of Birth: [birth year]   Medicare Important Message Given:  Yes    Shelbie Ammons, RN 10/05/2016, 8:27 AM

## 2016-10-05 NOTE — Progress Notes (Signed)
Informed by Dr Posey Pronto that the diarrhea has resolved. Cancell procedures. I will sign off.  Please call me if any further GI concerns or questions.  We would like to thank you for the opportunity to participate in the care of Jordan Brewer.   Dr Jonathon Bellows MD,MRCP [facility]) Gastroenterology/Hepatology Pager: [phone]

## 2016-10-05 NOTE — Clinical Social Work Placement (Signed)
   CLINICAL SOCIAL WORK PLACEMENT  NOTE  Date:  10/05/2016  Patient Details  Name: Jordan Brewer MRN: [id number] Date of Birth: 06-25-1940  Clinical Social Work is seeking post-discharge placement for this patient at the [place] level of care (*CSW will initial, date and re-position this form in  chart as items are completed):  Yes   Patient/family provided with [facility] Work Department's list of facilities offering this level of care within the geographic area requested by the patient (or if unable, by the patient's family).  Yes   Patient/family informed of their freedom to choose among providers that offer the needed level of care, that participate in Medicare, Medicaid or managed care program needed by the patient, have an available bed and are willing to accept the patient.  Yes   Patient/family informed of [place]'s ownership interest in [facility] and [facility], as well as of the fact that they are under no obligation to receive care at these facilities.  PASRR submitted to EDS on 10/01/16     PASRR number received on 10/02/16     Existing PASRR number confirmed on       FL2 transmitted to all facilities in geographic area requested by pt/family on 10/01/16     FL2 transmitted to all facilities within larger geographic area on       Patient informed that his/her managed care company has contracts with or will negotiate with certain facilities, including the following:        Yes   Patient/family informed of bed offers received.  Patient chooses bed at  ([facility] )     Physician recommends and patient chooses bed at      Patient to be transferred to  ([facility] ) on 10/05/16.  Patient to be transferred to facility by  [facility] )     Patient family notified on 10/05/16 of transfer.  Name of family member notified:   (CSW left patient's son Jordan Brewer a voicemail making him aware of D/C today. CSW also left patient's  daughter Jordan Brewer a voicemail. )     PHYSICIAN       Additional Comment:    _______________________________________________ Salim Forero, Veronia Beets, LCSW 10/05/2016, 11:40 AM

## 2016-10-05 NOTE — Discharge Summary (Signed)
[facility] [place] [facility], 76 y.o., DOB [birth year], MRN [id number]. Admission date: 09/28/2016 Discharge Date 10/05/2016 Primary MD Tracie Harrier, MD Admitting Physician Nicholes Mango, MD  Admission Diagnosis  Pyelonephritis [N12] Nausea vomiting and diarrhea [R11.2, R19.7] Acute renal failure, unspecified acute renal failure type [facility]) [N17.9]  Discharge Diagnosis   Active Problems:   Acute renal failure due to dehydration Diarrhea now improved Shortness of breath with fluid overload Possible aspiration pneumonia Diabetes type 2 History of Addison's disease Bipolar disorder Chronic back pain Previous history of DVT GERD Essential hypertension   Hospital Course  Jordan Brewer  is a 76 y.o. female with a known history of Addison's disease on Florinef, hypertension, hyperlipidemia,, diabetes medicines and other medical problems is presenting to the ED with a chief complaint of diarrhea as well as nausea and vomiting. Patient stated that the symptoms were going on for the past 3 weeks. And she finally decided to come to the emergency room. Patient was noted to have acute renal failure. She was aggressively hydrated. Her renal function has now normalized.  In terms of diarrhea patient underwent CT scan of abdomen which failed to show colitis. Her stool studies were negative for any type of bacterial or viral infection. Due to her history of Addison's disease she was treated with IV steroids. She has been treated with scheduled antidiarrheals and her diarrhea is improved.   I recommend she continue the antidiarrheals and Questran for the next 5 days scheduled   Patient also developed acute hypoxia requiring 5 L of oxygen further evaluation showed her she was fluid overloaded due to receiving fluids. She was given Lasix and now she is on 2 L of oxygen.  Patient also had cough and congestion and was thought to aspiration pneumonia which is  being treated with doxycycline she is on a dysphagia 3 diet.                 Consults  GI  Significant Tests:  See full reports for all details     Ct Abdomen Pelvis Wo Contrast  Result Date: 10/02/2016 CLINICAL DATA:  Diarrhea. EXAM: CT ABDOMEN AND PELVIS WITHOUT CONTRAST TECHNIQUE: Multidetector CT imaging of the abdomen and pelvis was performed following the standard protocol without IV contrast. COMPARISON:  09/28/2016 FINDINGS: Lower chest: There are small bilateral pleural effusions, right greater than left. Hepatobiliary: No focal liver abnormality is seen. No gallstones, gallbladder wall thickening, or biliary dilatation. Pancreas: Unremarkable. No pancreatic ductal dilatation or surrounding inflammatory changes. Spleen: Normal in size without focal abnormality. Adrenals/Urinary Tract: The adrenal glands are normal. No kidney stones or hydronephrosis identified. The urinary bladder appears normal. Stomach/Bowel: The stomach is normal. The small bowel loops have a normal course and caliber. There is no abnormal small bowel wall thickening or inflammation identified. Vascular/Lymphatic: Aortic atherosclerosis. No aneurysm. No abdominal adenopathy. No pelvic or inguinal adenopathy identified. Reproductive: Status post hysterectomy. No adnexal masses. Other: No abdominal wall hernia or abnormality. No abdominopelvic ascites. Musculoskeletal: Degenerative disc disease is identified within the lumbar spine. Chronic right superior and inferior pubic rami fractures. Previous left hip arthroplasty. IMPRESSION: 1. No acute findings within the abdomen or pelvis. No explanation for patient's diarrhea. 2. Bilateral pleural effusions 3.  Aortic Atherosclerosis (ICD10-I70.0). 4. Lumbar degenerative disc disease. Electronically Signed   By: Kerby Moors M.D.   On: 10/02/2016 13:18   Ct Abdomen Pelvis Wo Contrast  Result Date: 09/28/2016 CLINICAL DATA:  Abdominal pain, unspecified.  Vomiting,  diarrhea, and productive cough. EXAM: CT ABDOMEN AND PELVIS WITHOUT CONTRAST TECHNIQUE: Multidetector CT imaging of the abdomen and pelvis was performed following the standard protocol without IV contrast. COMPARISON:  10/30/2015 FINDINGS: Lower chest: Minimally seen bilateral breast calcification from implants. No acute finding. Hepatobiliary: No focal liver abnormality.No evidence of biliary obstruction or stone. Pancreas: Unremarkable. Spleen: Unremarkable. Adrenals/Urinary Tract: Negative adrenals. No hydronephrosis or stone. Mildly indistinct bladder wall without definitive thickening. Stomach/Bowel: No obstruction. Colonic diverticulosis. Appendectomy. No bowel inflammation noted. Vascular/Lymphatic: No acute vascular abnormality. There is generalized aortic atherosclerotic calcification. No mass or adenopathy. Reproductive:Hysterectomy.  Negative adnexae. Other: No ascites or pneumoperitoneum. Musculoskeletal: Remote right obturator ring and right sacral ala fractures. Status post left hip arthroplasty. Advanced spinal degeneration, with particularly severe disc degeneration at L4-5 and L1-2. There is a transitional S1 vertebra based on the lowest ribs. IMPRESSION: 1. Possible cystitis.  Please correlate with urinalysis. 2. Colonic diverticulosis. 3.    Aortic Atherosclerosis (ICD10-I70.0). Electronically Signed   By: Monte Fantasia M.D.   On: 09/28/2016 15:49   Dg Chest 1 View  Result Date: 09/30/2016 CLINICAL DATA:  Possible aspiration when taking liquid meds . Admitted with kidney infection. Non smoker, denies heart/lung problems. EXAM: CHEST 1 VIEW COMPARISON:  09/30/2016 FINDINGS: Cardiac silhouette is normal in size. No mediastinal or hilar masses. No convincing adenopathy. Mild opacity is noted in the right lower lobe which is similar to the earlier study, but new from an exam dated 11/14/2015. This is likely atelectasis. Pneumonia or mild aspiration pneumonitis is possible. Remainder of the lungs  is clear. No convincing pleural effusion. No pneumothorax. IMPRESSION: 1. No significant change from the chest radiograph obtained earlier this same date. 2. Mild right lung base opacity which could reflect pneumonia or aspiration pneumonitis but more likely atelectasis. Electronically Signed   By: Lajean Manes M.D.   On: 09/30/2016 18:24   Dg Chest 2 View  Result Date: 09/30/2016 CLINICAL DATA:  Cough EXAM: CHEST  2 VIEW COMPARISON:  11/14/2015 FINDINGS: Cardiac shadow is enlarged. Aortic calcifications are again seen. Mild vascular congestion is noted without significant edema. Calcified breast implants are noted bilaterally. IMPRESSION: Mild vascular congestion without significant edema. Electronically Signed   By: Inez Catalina M.D.   On: 09/30/2016 15:03   Dg Chest Port 1 View  Result Date: 10/04/2016 CLINICAL DATA:  Shortness of breath. EXAM: PORTABLE CHEST 1 VIEW COMPARISON:  09/30/2016. FINDINGS: Mediastinum and hilar structures normal. Cardiomegaly with normal pulmonary vascularity. Mild bilateral pulmonary interstitial prominence noted consistent with CHF. Interim partial clearing of right base atelectasis. No pleural effusion or pneumothorax. Bilateral breast implants. No acute bony abnormality. IMPRESSION: 1. Cardiomegaly with mild bilateral pulmonary interstitial prominence. Mild CHF cannot be excluded. Mild pneumonitis cannot be excluded. 2.  Interim partial clearing of right base atelectasis . Electronically Signed   By: Marcello Moores  Register   On: 10/04/2016 13:18       Today   Subjective:   Jordan Brewer  feeling better denies any chest pain or shortness of breath and breathing improved  Objective:   Blood pressure (!) 127/58, pulse (!) 108, temperature 97.9 F (36.6 C), temperature source Oral, resp. rate 20, height 5\' 2"  (1.575 m), weight 157 lb (71.2 kg), SpO2 93 %.  .  Intake/Output Summary (Last 24 hours) at 10/05/16 1032 Last data filed at 10/05/16 0807  Gross per 24 hour   Intake              240  ml  Output             1400 ml  Net            -1160 ml    Exam VITAL SIGNS: Blood pressure (!) 127/58, pulse (!) 108, temperature 97.9 F (36.6 C), temperature source Oral, resp. rate 20, height 5\' 2"  (1.575 m), weight 157 lb (71.2 kg), SpO2 93 %.  GENERAL:  76 y.o.-year-old patient lying in the bed with no acute distress.  EYES: Pupils equal, round, reactive to light and accommodation. No scleral icterus. Extraocular muscles intact.  HEENT: Head atraumatic, normocephalic. Oropharynx and nasopharynx clear.  NECK:  Supple, no jugular venous distention. No thyroid enlargement, no tenderness.  LUNGS: Normal breath sounds bilaterally, no wheezing, rales,rhonchi or crepitation. No use of accessory muscles of respiration.  CARDIOVASCULAR: S1, S2 normal. No murmurs, rubs, or gallops.  ABDOMEN: Soft, nontender, nondistended. Bowel sounds present. No organomegaly or mass.  EXTREMITIES: No pedal edema, cyanosis, or clubbing.  NEUROLOGIC: Cranial nerves II through XII are intact. Muscle strength 5/5 in all extremities. Sensation intact. Gait not checked.  PSYCHIATRIC: The patient is alert and oriented x 3.  SKIN: No obvious rash, lesion, or ulcer.   Data Review     CBC w Diff: Lab Results  Component Value Date   WBC 7.0 10/02/2016   HGB 9.5 (L) 10/02/2016   HGB 10.8 (L) 07/16/2013   HCT 29.5 (L) 10/02/2016   HCT 34.4 (L) 07/16/2013   PLT 366 10/02/2016   PLT 320 07/16/2013   LYMPHOPCT 8 09/30/2016   LYMPHOPCT 4.1 07/16/2013   BANDSPCT PENDING 11/13/2014   MONOPCT 2 09/30/2016   MONOPCT 3.3 07/16/2013   EOSPCT 1 09/30/2016   EOSPCT 0.0 07/16/2013   BASOPCT 0 09/30/2016   BASOPCT 0.4 07/16/2013   CMP: Lab Results  Component Value Date   NA 144 10/04/2016   NA 142 07/16/2013   K 4.4 10/04/2016   K 4.5 07/16/2013   CL 120 (H) 10/04/2016   CL 106 07/16/2013   CO2 18 (L) 10/04/2016   CO2 27 07/16/2013   BUN 23 (H) 10/04/2016   BUN 22 (H) 07/16/2013    CREATININE 0.95 10/05/2016   CREATININE 1.05 07/16/2013   PROT 6.9 09/28/2016   PROT 8.2 07/13/2013   ALBUMIN 3.2 (L) 09/28/2016   ALBUMIN 2.8 (L) 07/13/2013   BILITOT 0.6 09/28/2016   BILITOT 0.6 07/13/2013   ALKPHOS 83 09/28/2016   ALKPHOS 90 07/13/2013   AST 30 09/28/2016   AST 21 07/13/2013   ALT 22 09/28/2016   ALT 19 07/13/2013  .  Micro Results Recent Results (from the past 240 hour(s))  Gastrointestinal Panel by PCR , Stool     Status: None   Collection Time: 09/28/16  1:44 PM  Result Value Ref Range Status   Campylobacter species NOT DETECTED NOT DETECTED Final   Plesimonas shigelloides NOT DETECTED NOT DETECTED Final   Salmonella species NOT DETECTED NOT DETECTED Final   Yersinia enterocolitica NOT DETECTED NOT DETECTED Final   Vibrio species NOT DETECTED NOT DETECTED Final   Vibrio cholerae NOT DETECTED NOT DETECTED Final   Enteroaggregative E coli (EAEC) NOT DETECTED NOT DETECTED Final   Enteropathogenic E coli (EPEC) NOT DETECTED NOT DETECTED Final   Enterotoxigenic E coli (ETEC) NOT DETECTED NOT DETECTED Final   Shiga like toxin producing E coli (STEC) NOT DETECTED NOT DETECTED Final   Shigella/Enteroinvasive E coli (EIEC) NOT DETECTED NOT DETECTED Final   Cryptosporidium NOT DETECTED  NOT DETECTED Final   Cyclospora cayetanensis NOT DETECTED NOT DETECTED Final   Entamoeba histolytica NOT DETECTED NOT DETECTED Final   Giardia lamblia NOT DETECTED NOT DETECTED Final   Adenovirus F40/41 NOT DETECTED NOT DETECTED Final   Astrovirus NOT DETECTED NOT DETECTED Final   Norovirus GI/GII NOT DETECTED NOT DETECTED Final   Rotavirus A NOT DETECTED NOT DETECTED Final   Sapovirus (I, II, IV, and V) NOT DETECTED NOT DETECTED Final  C difficile quick scan w PCR reflex     Status: None   Collection Time: 09/28/16  1:44 PM  Result Value Ref Range Status   C Diff antigen NEGATIVE NEGATIVE Final   C Diff toxin NEGATIVE NEGATIVE Final   C Diff interpretation No C. difficile  detected.  Final  Urine culture     Status: Abnormal   Collection Time: 09/28/16  1:44 PM  Result Value Ref Range Status   Specimen Description URINE, CLEAN CATCH  Final   Special Requests NONE  Final   Culture >=100,000 COLONIES/mL YEAST (A)  Final   Report Status 09/30/2016 FINAL  Final        Code Status Orders        Start     Ordered   09/28/16 1709  Full code  Continuous     09/28/16 1708    Code Status History    Date Active Date Inactive Code Status Order ID Comments User Context   11/14/2015  5:51 PM 11/16/2015  5:12 PM Full Code [id number]  Nicholes Mango, MD Inpatient   11/07/2015  1:31 PM 11/11/2015  6:27 PM DNR [id number]  Demetrios Loll, MD Inpatient   10/30/2015  1:20 PM 11/02/2015  5:23 PM Full Code [id number]  Hower, Aaron Mose, MD ED   11/13/2014  5:38 PM 11/16/2014  7:20 PM Full Code [id number]  Nicholes Mango, MD Inpatient   04/10/2012  6:13 PM 04/11/2012  9:57 PM Full Code [id number]  Charlynne Cousins, MD Inpatient           Contact information for follow-up providers    Tracie Harrier, MD Follow up in 1 week(s).   Specialty:  Internal Medicine Contact information: [address] [place] [address] [phone]            Contact information for after-discharge care    Destination    [facility] Follow up.   Specialty:  [place] information: [address] [place] [phone]                  Discharge Medications   Allergies as of 10/05/2016      Reactions   Codeine Nausea And Vomiting   Prednisone Other (See Comments)   Can't take due to Addison's disease   Promethazine Nausea And Vomiting   Lidoderm [lidocaine] Rash      Medication List    STOP taking these medications   amLODipine 10 MG tablet Commonly known as:  NORVASC   insulin aspart 100 UNIT/ML injection Commonly known as:  novoLOG   losartan 50 MG tablet Commonly known as:   COZAAR     TAKE these medications   cholestyramine 4 g packet Commonly known as:  QUESTRAN Take 1 packet (4 g total) by mouth 4 (four) times daily.   diphenoxylate-atropine 2.5-0.025 MG tablet Commonly known as:  LOMOTIL Take 1 tablet by mouth 3 (three) times daily.   docusate sodium 100 MG capsule Commonly known  as:  COLACE Take 1 capsule (100 mg total) by mouth 2 (two) times daily. What changed:  when to take this  reasons to take this   doxycycline 100 MG capsule Commonly known as:  MONODOX Take 1 capsule (100 mg total) by mouth 2 (two) times daily.   DULoxetine 60 MG capsule Commonly known as:  CYMBALTA Take 60 mg by mouth daily.   fludrocortisone 0.1 MG tablet Commonly known as:  FLORINEF Take 0.1 mg by mouth daily.   guaiFENesin-dextromethorphan 100-10 MG/5ML syrup Commonly known as:  ROBITUSSIN DM Take 10 mLs by mouth every 4 (four) hours as needed for cough.   hydrocortisone 10 MG tablet Commonly known as:  CORTEF Take 10-20 mg by mouth 2 (two) times daily. 20 mg every morning and 10 mg at bedtime   insulin glargine 100 unit/mL Sopn Commonly known as:  LANTUS Inject 15 Units into the skin at bedtime.   ipratropium-albuterol 0.5-2.5 (3) MG/3ML Soln Commonly known as:  DUONEB Take 3 mLs by nebulization every 6 (six) hours as needed.   lovastatin 40 MG tablet Commonly known as:  MEVACOR Take 40 mg by mouth daily with supper.   magic mouthwash Soln Take 10 mLs by mouth 3 (three) times daily.   metoprolol tartrate 50 MG tablet Commonly known as:  LOPRESSOR Take 50 mg by mouth 2 (two) times daily.   mirtazapine 15 MG tablet Commonly known as:  REMERON Take 15 mg by mouth at bedtime.   nystatin powder Generic drug:  nystatin Apply topically 2 (two) times daily.   omeprazole 40 MG capsule Commonly known as:  PRILOSEC Take 40 mg by mouth 2 (two) times daily.   ondansetron 4 MG tablet Commonly known as:  ZOFRAN Take 1 tablet (4 mg total) by  mouth every 6 (six) hours as needed for nausea.   traMADol 50 MG tablet Commonly known as:  ULTRAM Take 1 tablet (50 mg total) by mouth every 6 (six) hours as needed for moderate pain.            Discharge Care Instructions        Start     Ordered   10/05/16 0000  magic mouthwash SOLN  3 times daily     10/05/16 1023   10/05/16 0000  ipratropium-albuterol (DUONEB) 0.5-2.5 (3) MG/3ML SOLN  Every 6 hours PRN     10/05/16 1023   10/05/16 0000  diphenoxylate-atropine (LOMOTIL) 2.5-0.025 MG tablet  3 times daily     10/05/16 1023   10/05/16 0000  ondansetron (ZOFRAN) 4 MG tablet  Every 6 hours PRN     10/05/16 1023   10/05/16 0000  cholestyramine (QUESTRAN) 4 g packet  4 times daily     10/05/16 1023   10/05/16 0000  guaiFENesin-dextromethorphan (ROBITUSSIN DM) 100-10 MG/5ML syrup  Every 4 hours PRN     10/05/16 1023   10/05/16 0000  doxycycline (MONODOX) 100 MG capsule  2 times daily     10/05/16 1023   10/05/16 0000  traMADol (ULTRAM) 50 MG tablet  Every 6 hours PRN     10/05/16 1026         Total Time in preparing paper work, data evaluation and todays exam - 35 minutes  Dustin Flock M.D on 10/05/2016 at 10:32 AM  [facility]   Office  [phone]

## 2016-10-05 NOTE — Progress Notes (Signed)
Patient discharged via EMS. Jessel Gettinger S, RN  

## 2016-10-05 NOTE — Discharge Instructions (Signed)
[place] [facility] [place]:  Dysphagia 3 diet, with thin liquids- cardiac and cabohydrate consistent diet  DISCHARGE CONDITION:  Stable  ACTIVITY:  Activity as tolerated  OXYGEN:  Home Oxygen: Yes.     Oxygen Delivery: 2 liters/min via Patient connected to nasal cannula oxygen  DISCHARGE LOCATION:  nursing home    ADDITIONAL DISCHARGE INSTRUCTION:   If you experience worsening of your admission symptoms, develop shortness of breath, life threatening emergency, suicidal or homicidal thoughts you must seek medical attention immediately by calling 911 or calling your MD immediately  if symptoms less severe.  You Must read complete instructions/literature along with all the possible adverse reactions/side effects for all the Medicines you take and that have been prescribed to you. Take any new Medicines after you have completely understood and accpet all the possible adverse reactions/side effects.   Please note  You were cared for by a hospitalist during your hospital stay. If you have any questions about your discharge medications or the care you received while you were in the hospital after you are discharged, you can call the unit and asked to speak with the hospitalist on call if the hospitalist that took care of you is not available. Once you are discharged, your primary care physician will handle any further medical issues. Please note that NO REFILLS for any discharge medications will be authorized once you are discharged, as it is imperative that you return to your primary care physician (or establish a relationship with a primary care physician if you do not have one) for your aftercare needs so that they can reassess your need for medications and monitor your lab values.

## 2016-10-09 DIAGNOSIS — J189 Pneumonia, unspecified organism: Secondary | ICD-10-CM | POA: Diagnosis not present

## 2016-10-09 DIAGNOSIS — E271 Primary adrenocortical insufficiency: Secondary | ICD-10-CM | POA: Diagnosis not present

## 2016-10-09 DIAGNOSIS — Z794 Long term (current) use of insulin: Secondary | ICD-10-CM | POA: Diagnosis not present

## 2016-10-09 DIAGNOSIS — I10 Essential (primary) hypertension: Secondary | ICD-10-CM | POA: Diagnosis not present

## 2016-10-09 DIAGNOSIS — F419 Anxiety disorder, unspecified: Secondary | ICD-10-CM | POA: Diagnosis not present

## 2016-10-09 DIAGNOSIS — N179 Acute kidney failure, unspecified: Secondary | ICD-10-CM | POA: Diagnosis not present

## 2016-10-09 DIAGNOSIS — E119 Type 2 diabetes mellitus without complications: Secondary | ICD-10-CM | POA: Diagnosis not present

## 2016-10-09 DIAGNOSIS — M6281 Muscle weakness (generalized): Secondary | ICD-10-CM | POA: Diagnosis not present

## 2016-10-09 DIAGNOSIS — E785 Hyperlipidemia, unspecified: Secondary | ICD-10-CM | POA: Diagnosis not present

## 2016-10-09 LAB — CALPROTECTIN, FECAL: CALPROTECTIN, FECAL: 85 ug/g (ref 0–120)

## 2016-10-15 ENCOUNTER — Telehealth: Payer: Self-pay

## 2016-10-15 NOTE — Telephone Encounter (Signed)
Unable to lvm for patient. Called to advise of results per Dr. Vicente Males.   No inflammation of GI tract based on result

## 2016-10-15 NOTE — Telephone Encounter (Signed)
-----   Message from Jonathon Bellows, MD sent at 10/14/2016 12:18 PM EDT ----- No inflammation of GI tract based on result

## 2016-10-16 ENCOUNTER — Telehealth: Payer: Self-pay

## 2016-10-16 NOTE — Telephone Encounter (Signed)
Unable to LVM.   2nd attempt to advise patient of results per Dr. Vicente Males.

## 2016-10-16 NOTE — Telephone Encounter (Signed)
-----   Message from Jonathon Bellows, MD sent at 10/14/2016 12:18 PM EDT ----- No inflammation of GI tract based on result

## 2016-10-22 DIAGNOSIS — E271 Primary adrenocortical insufficiency: Secondary | ICD-10-CM | POA: Diagnosis not present

## 2016-10-22 DIAGNOSIS — F3176 Bipolar disorder, in full remission, most recent episode depressed: Secondary | ICD-10-CM | POA: Diagnosis not present

## 2016-10-22 DIAGNOSIS — N12 Tubulo-interstitial nephritis, not specified as acute or chronic: Secondary | ICD-10-CM | POA: Diagnosis not present

## 2016-10-22 DIAGNOSIS — G894 Chronic pain syndrome: Secondary | ICD-10-CM | POA: Diagnosis not present

## 2016-10-22 DIAGNOSIS — R1312 Dysphagia, oropharyngeal phase: Secondary | ICD-10-CM | POA: Diagnosis not present

## 2016-10-22 DIAGNOSIS — M6281 Muscle weakness (generalized): Secondary | ICD-10-CM | POA: Diagnosis not present

## 2016-10-22 DIAGNOSIS — E785 Hyperlipidemia, unspecified: Secondary | ICD-10-CM | POA: Diagnosis not present

## 2016-10-22 DIAGNOSIS — E1140 Type 2 diabetes mellitus with diabetic neuropathy, unspecified: Secondary | ICD-10-CM | POA: Diagnosis not present

## 2016-10-22 DIAGNOSIS — M549 Dorsalgia, unspecified: Secondary | ICD-10-CM | POA: Diagnosis not present

## 2016-10-25 DIAGNOSIS — N12 Tubulo-interstitial nephritis, not specified as acute or chronic: Secondary | ICD-10-CM | POA: Diagnosis not present

## 2016-10-25 DIAGNOSIS — M6281 Muscle weakness (generalized): Secondary | ICD-10-CM | POA: Diagnosis not present

## 2016-10-25 DIAGNOSIS — F3176 Bipolar disorder, in full remission, most recent episode depressed: Secondary | ICD-10-CM | POA: Diagnosis not present

## 2016-10-25 DIAGNOSIS — G894 Chronic pain syndrome: Secondary | ICD-10-CM | POA: Diagnosis not present

## 2016-10-25 DIAGNOSIS — E785 Hyperlipidemia, unspecified: Secondary | ICD-10-CM | POA: Diagnosis not present

## 2016-10-25 DIAGNOSIS — R1312 Dysphagia, oropharyngeal phase: Secondary | ICD-10-CM | POA: Diagnosis not present

## 2016-10-25 DIAGNOSIS — E1140 Type 2 diabetes mellitus with diabetic neuropathy, unspecified: Secondary | ICD-10-CM | POA: Diagnosis not present

## 2016-10-25 DIAGNOSIS — M549 Dorsalgia, unspecified: Secondary | ICD-10-CM | POA: Diagnosis not present

## 2016-10-25 DIAGNOSIS — E271 Primary adrenocortical insufficiency: Secondary | ICD-10-CM | POA: Diagnosis not present

## 2016-10-31 DIAGNOSIS — E271 Primary adrenocortical insufficiency: Secondary | ICD-10-CM | POA: Diagnosis not present

## 2016-10-31 DIAGNOSIS — M6281 Muscle weakness (generalized): Secondary | ICD-10-CM | POA: Diagnosis not present

## 2016-10-31 DIAGNOSIS — E785 Hyperlipidemia, unspecified: Secondary | ICD-10-CM | POA: Diagnosis not present

## 2016-10-31 DIAGNOSIS — N12 Tubulo-interstitial nephritis, not specified as acute or chronic: Secondary | ICD-10-CM | POA: Diagnosis not present

## 2016-10-31 DIAGNOSIS — F3176 Bipolar disorder, in full remission, most recent episode depressed: Secondary | ICD-10-CM | POA: Diagnosis not present

## 2016-10-31 DIAGNOSIS — M549 Dorsalgia, unspecified: Secondary | ICD-10-CM | POA: Diagnosis not present

## 2016-10-31 DIAGNOSIS — E1140 Type 2 diabetes mellitus with diabetic neuropathy, unspecified: Secondary | ICD-10-CM | POA: Diagnosis not present

## 2016-10-31 DIAGNOSIS — R1312 Dysphagia, oropharyngeal phase: Secondary | ICD-10-CM | POA: Diagnosis not present

## 2016-10-31 DIAGNOSIS — G894 Chronic pain syndrome: Secondary | ICD-10-CM | POA: Diagnosis not present

## 2016-11-01 DIAGNOSIS — D649 Anemia, unspecified: Secondary | ICD-10-CM | POA: Diagnosis not present

## 2016-11-01 DIAGNOSIS — Z794 Long term (current) use of insulin: Secondary | ICD-10-CM | POA: Diagnosis not present

## 2016-11-01 DIAGNOSIS — I10 Essential (primary) hypertension: Secondary | ICD-10-CM | POA: Diagnosis not present

## 2016-11-01 DIAGNOSIS — Z09 Encounter for follow-up examination after completed treatment for conditions other than malignant neoplasm: Secondary | ICD-10-CM | POA: Diagnosis not present

## 2016-11-01 DIAGNOSIS — F329 Major depressive disorder, single episode, unspecified: Secondary | ICD-10-CM | POA: Diagnosis not present

## 2016-11-01 DIAGNOSIS — R531 Weakness: Secondary | ICD-10-CM | POA: Diagnosis not present

## 2016-11-01 DIAGNOSIS — G894 Chronic pain syndrome: Secondary | ICD-10-CM | POA: Diagnosis not present

## 2016-11-01 DIAGNOSIS — E119 Type 2 diabetes mellitus without complications: Secondary | ICD-10-CM | POA: Diagnosis not present

## 2016-11-05 DIAGNOSIS — E271 Primary adrenocortical insufficiency: Secondary | ICD-10-CM | POA: Diagnosis not present

## 2016-11-05 DIAGNOSIS — R1312 Dysphagia, oropharyngeal phase: Secondary | ICD-10-CM | POA: Diagnosis not present

## 2016-11-05 DIAGNOSIS — N12 Tubulo-interstitial nephritis, not specified as acute or chronic: Secondary | ICD-10-CM | POA: Diagnosis not present

## 2016-11-05 DIAGNOSIS — E1140 Type 2 diabetes mellitus with diabetic neuropathy, unspecified: Secondary | ICD-10-CM | POA: Diagnosis not present

## 2016-11-07 DIAGNOSIS — R1312 Dysphagia, oropharyngeal phase: Secondary | ICD-10-CM | POA: Diagnosis not present

## 2016-11-07 DIAGNOSIS — E271 Primary adrenocortical insufficiency: Secondary | ICD-10-CM | POA: Diagnosis not present

## 2016-11-07 DIAGNOSIS — E1140 Type 2 diabetes mellitus with diabetic neuropathy, unspecified: Secondary | ICD-10-CM | POA: Diagnosis not present

## 2016-11-07 DIAGNOSIS — G894 Chronic pain syndrome: Secondary | ICD-10-CM | POA: Diagnosis not present

## 2016-11-07 DIAGNOSIS — M6281 Muscle weakness (generalized): Secondary | ICD-10-CM | POA: Diagnosis not present

## 2016-11-07 DIAGNOSIS — F3176 Bipolar disorder, in full remission, most recent episode depressed: Secondary | ICD-10-CM | POA: Diagnosis not present

## 2016-11-07 DIAGNOSIS — E785 Hyperlipidemia, unspecified: Secondary | ICD-10-CM | POA: Diagnosis not present

## 2016-11-07 DIAGNOSIS — M549 Dorsalgia, unspecified: Secondary | ICD-10-CM | POA: Diagnosis not present

## 2016-11-07 DIAGNOSIS — N12 Tubulo-interstitial nephritis, not specified as acute or chronic: Secondary | ICD-10-CM | POA: Diagnosis not present

## 2016-11-12 DIAGNOSIS — F3176 Bipolar disorder, in full remission, most recent episode depressed: Secondary | ICD-10-CM | POA: Diagnosis not present

## 2016-11-12 DIAGNOSIS — G894 Chronic pain syndrome: Secondary | ICD-10-CM | POA: Diagnosis not present

## 2016-11-12 DIAGNOSIS — M6281 Muscle weakness (generalized): Secondary | ICD-10-CM | POA: Diagnosis not present

## 2016-11-12 DIAGNOSIS — E785 Hyperlipidemia, unspecified: Secondary | ICD-10-CM | POA: Diagnosis not present

## 2016-11-12 DIAGNOSIS — N12 Tubulo-interstitial nephritis, not specified as acute or chronic: Secondary | ICD-10-CM | POA: Diagnosis not present

## 2016-11-12 DIAGNOSIS — M549 Dorsalgia, unspecified: Secondary | ICD-10-CM | POA: Diagnosis not present

## 2016-11-12 DIAGNOSIS — E271 Primary adrenocortical insufficiency: Secondary | ICD-10-CM | POA: Diagnosis not present

## 2016-11-12 DIAGNOSIS — R1312 Dysphagia, oropharyngeal phase: Secondary | ICD-10-CM | POA: Diagnosis not present

## 2016-11-12 DIAGNOSIS — E1140 Type 2 diabetes mellitus with diabetic neuropathy, unspecified: Secondary | ICD-10-CM | POA: Diagnosis not present

## 2016-11-16 DIAGNOSIS — N12 Tubulo-interstitial nephritis, not specified as acute or chronic: Secondary | ICD-10-CM | POA: Diagnosis not present

## 2016-11-16 DIAGNOSIS — F3176 Bipolar disorder, in full remission, most recent episode depressed: Secondary | ICD-10-CM | POA: Diagnosis not present

## 2016-11-16 DIAGNOSIS — E271 Primary adrenocortical insufficiency: Secondary | ICD-10-CM | POA: Diagnosis not present

## 2016-11-16 DIAGNOSIS — M6281 Muscle weakness (generalized): Secondary | ICD-10-CM | POA: Diagnosis not present

## 2016-11-16 DIAGNOSIS — M549 Dorsalgia, unspecified: Secondary | ICD-10-CM | POA: Diagnosis not present

## 2016-11-16 DIAGNOSIS — R1312 Dysphagia, oropharyngeal phase: Secondary | ICD-10-CM | POA: Diagnosis not present

## 2016-11-16 DIAGNOSIS — E785 Hyperlipidemia, unspecified: Secondary | ICD-10-CM | POA: Diagnosis not present

## 2016-11-16 DIAGNOSIS — G894 Chronic pain syndrome: Secondary | ICD-10-CM | POA: Diagnosis not present

## 2016-11-16 DIAGNOSIS — E1140 Type 2 diabetes mellitus with diabetic neuropathy, unspecified: Secondary | ICD-10-CM | POA: Diagnosis not present

## 2017-02-08 DEATH — deceased

## 2018-07-29 IMAGING — CT CT ABD-PELV W/ CM
2 of 5 series · 15 of 46 positions shown, 17 images · IV contrast (APPLIED)
Comparison: 08/20/2006

CLINICAL DATA: Left lower quadrant pain, bloody stool this morning,
status post colostomy reversal

EXAM:
CT ABDOMEN AND PELVIS WITH CONTRAST
TECHNIQUE: Multidetector CT imaging of the abdomen and pelvis was performed
using the standard protocol following bolus administration of
intravenous contrast.
CONTRAST:  75mL YW8ODO-CSS IOPAMIDOL (YW8ODO-CSS) INJECTION 61%

[Series 2: axial st · axial · 0.84mm/px · z∈[-317,+123]mm · 12 of 98 slices shown, 14 images]
[im 5/98  soft-tissue]
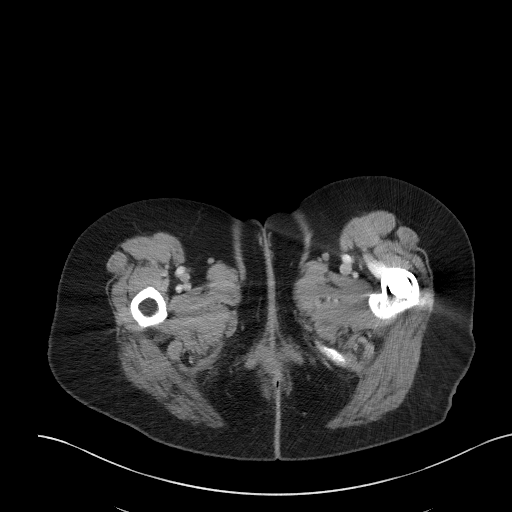
[im 5/98  bone]
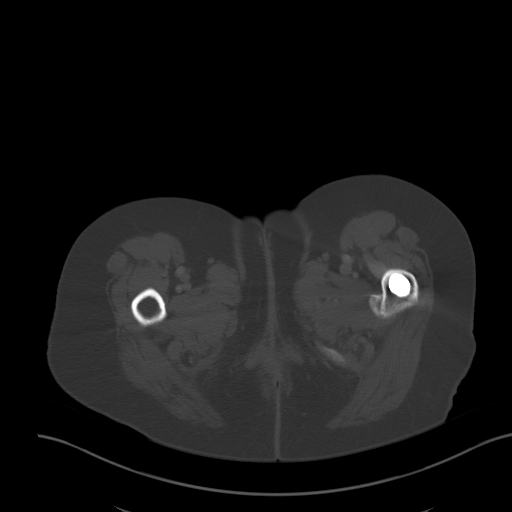
[im 15/98  soft-tissue]
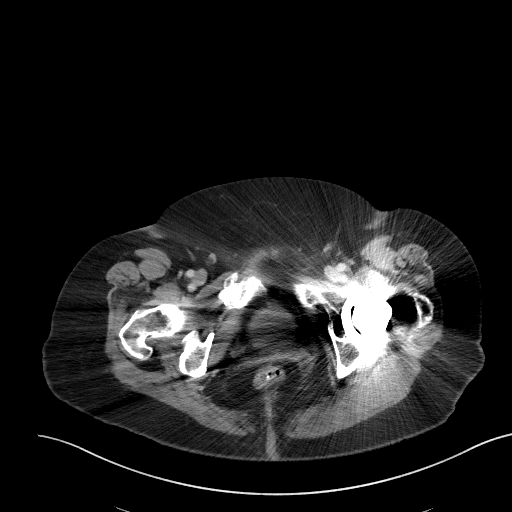
[im 20/98  soft-tissue]
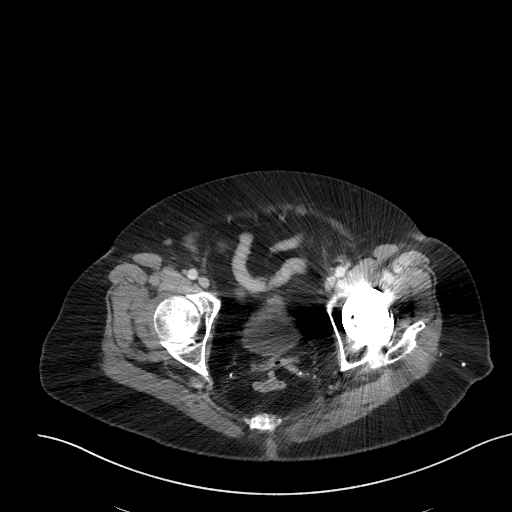
[im 30/98  soft-tissue]
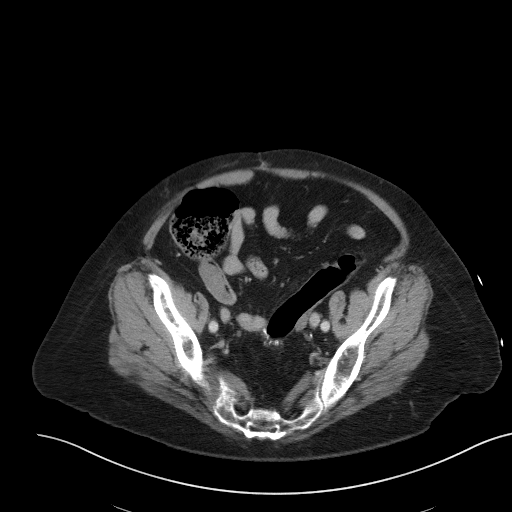
[im 39/98  soft-tissue]
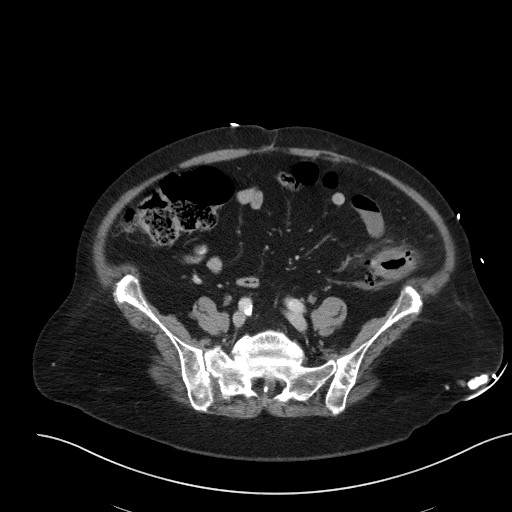
[im 44/98  soft-tissue]
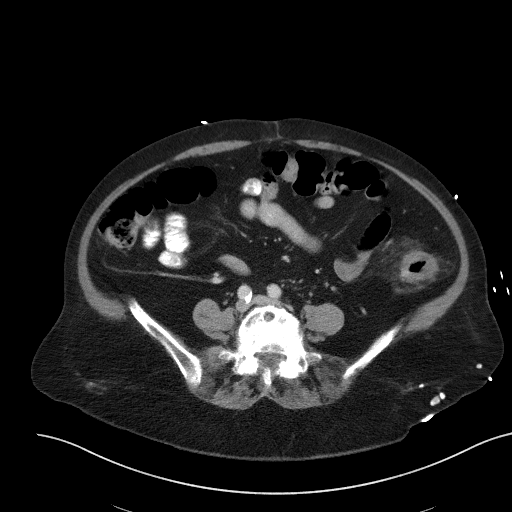
[im 54/98  soft-tissue]
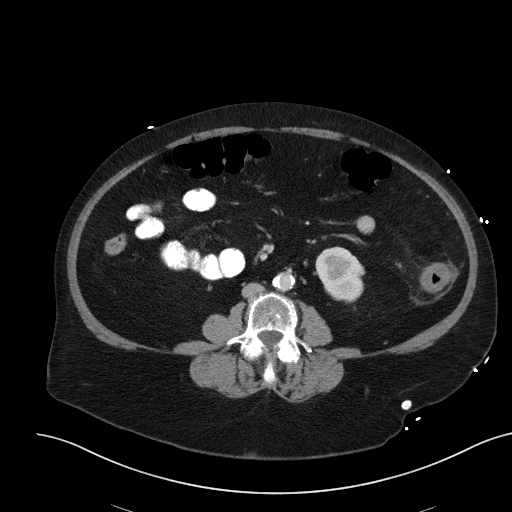
[im 59/98  soft-tissue]
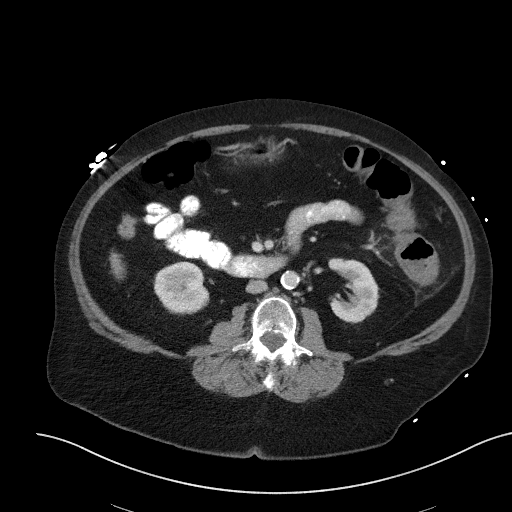
[im 68/98  soft-tissue]
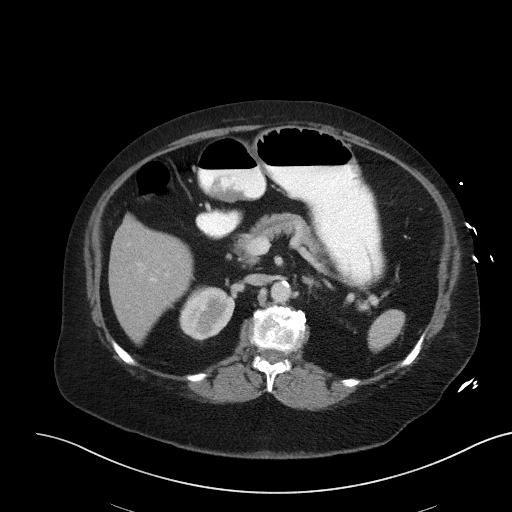
[im 68/98  bone]
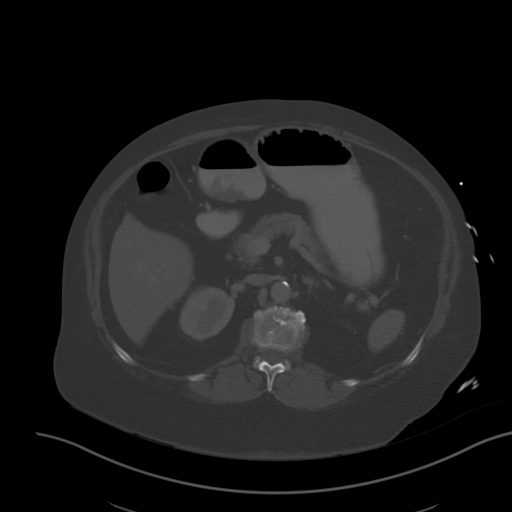
[im 78/98  soft-tissue]
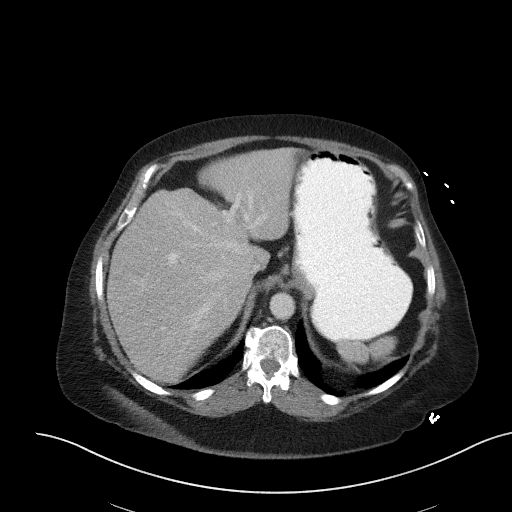
[im 83/98  soft-tissue]
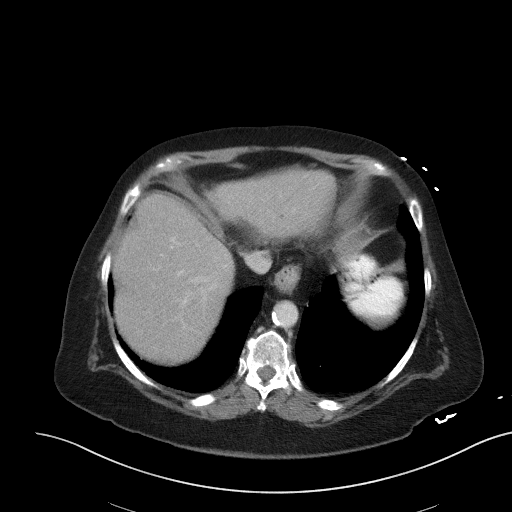
[im 93/98  soft-tissue]
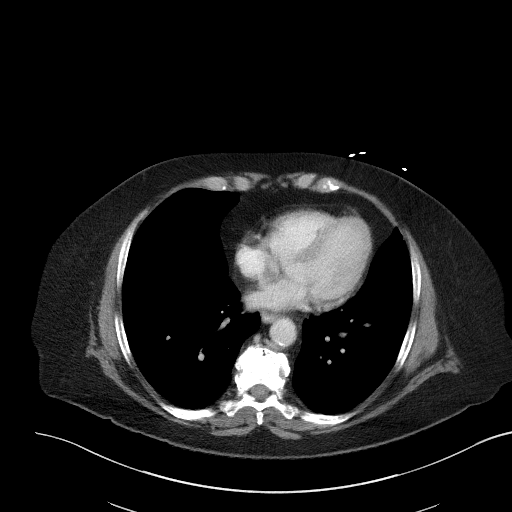

[Series 5: coronal st · coronal · 0.73mm/px · 3 of 99 slices shown]
[im 33/99  soft-tissue]
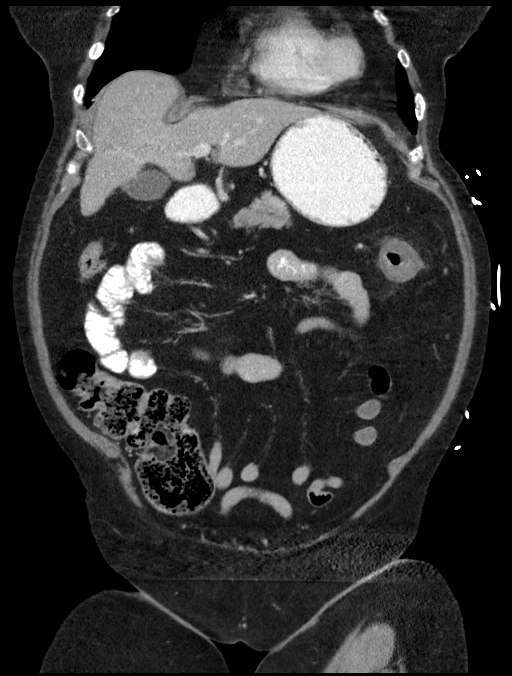
[im 44/99  soft-tissue]
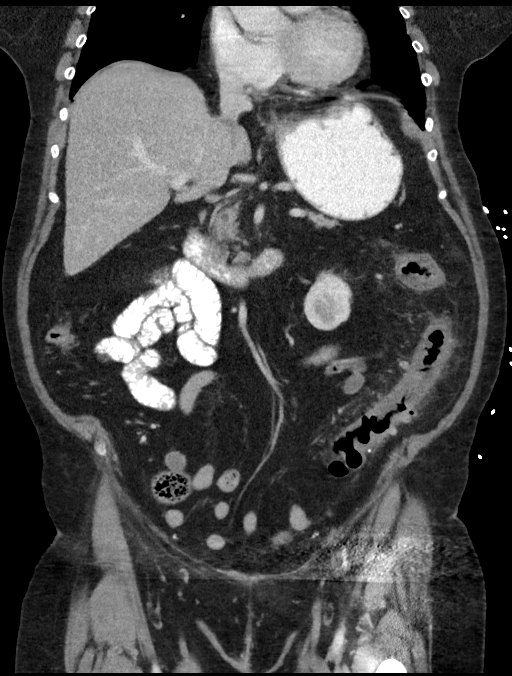
[im 55/99  soft-tissue]
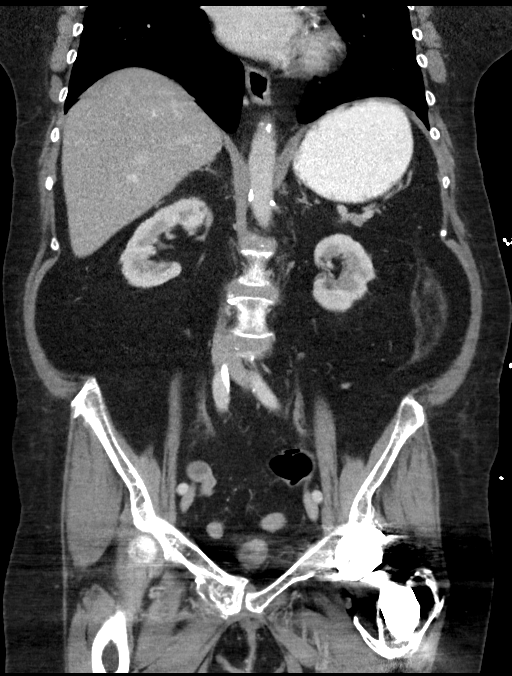

[15 of 46 positions shown; findings below may reference images not displayed]

FINDINGS: Lower chest: The lung bases are unremarkable.  Small hiatal hernia

Hepatobiliary: Mild fatty infiltration of the liver. No focal
hepatic mass. No calcified gallstones are noted within gallbladder.

Pancreas: Enhanced pancreas is unremarkable. Main pancreatic duct
measures 2.7 mm in diameter.

Spleen: Enhanced spleen is unremarkable.

Adrenals/Urinary Tract: No adrenal gland mass. Enhanced kidneys are
symmetrical in size. No hydronephrosis or hydroureter.

Stomach/Bowel: There is no small bowel obstruction. No thickened or
dilated small bowel loops. The terminal ileum is unremarkable.
Moderate stool noted within cecum. The patient is status post
appendectomy. No pericecal inflammation.

Colonic diverticula are noted in descending colon. There is abnormal
thickening of colonic wall in splenic flexure of the colon and
descending colon. There is stranding of pericolonic fat. Findings
are consistent with segmental acute colitis. No definite evidence of
pericolonic abscess or perforation. No extraluminal air. Again noted
postsurgical changes distal colon. There is no evidence of
anastomotic stricture.

Vascular/Lymphatic: Atherosclerotic calcifications of abdominal
aorta and iliac arteries. No aortic aneurysm.

Reproductive: The patient is status post hysterectomy. No pelvic
masses noted.

Other: There are extensive metallic artifact from left hip
prosthesis. Urinary bladder is under distended limiting its
assessment.

Musculoskeletal: There is interval fracture deformity of right pubic
ramus adjacent to pubic symphysis.

Sagittal images of the spine shows osteopenia and a degenerative
changes thoracolumbar spine. There are degenerative changes
bilateral SI joints.
IMPRESSION: 1. There is abnormal thickening of colonic wall in splenic flexure
of the colon and descending colon. Findings are consistent with
acute segmental colitis. There is stranding of pericolonic fat. No
pericolonic abscess or definite evidence of perforation.
2. Stable postsurgical changes in distal colon within pelvis without
evidence of anastomotic stricture.
3. Moderate stool noted within cecum. No pericecal inflammation.
Status post appendectomy. No small bowel obstruction.
4. Status post hysterectomy.
5. Degenerative changes thoracolumbar spine.
6. Small hiatal hernia.
7. Interval fracture deformity of the right pubic bones adjacent to
pubic symphysis of indeterminate age. Clinical correlation is
necessary.
